# Patient Record
Sex: Male | Born: 1935 | Race: White | Hispanic: No | Marital: Married | Smoking: Never smoker
Health system: Southern US, Community
[De-identification: ages and names within clinical notes are randomized; demographics above are authoritative.]

## PROBLEM LIST (undated history)

## (undated) DIAGNOSIS — H579 Unspecified disorder of eye and adnexa: Secondary | ICD-10-CM

## (undated) DIAGNOSIS — M545 Low back pain, unspecified: Secondary | ICD-10-CM

## (undated) DIAGNOSIS — G459 Transient cerebral ischemic attack, unspecified: Secondary | ICD-10-CM

## (undated) DIAGNOSIS — M1990 Unspecified osteoarthritis, unspecified site: Secondary | ICD-10-CM

## (undated) DIAGNOSIS — I10 Essential (primary) hypertension: Secondary | ICD-10-CM

## (undated) DIAGNOSIS — I4891 Unspecified atrial fibrillation: Secondary | ICD-10-CM

## (undated) DIAGNOSIS — E785 Hyperlipidemia, unspecified: Secondary | ICD-10-CM

## (undated) DIAGNOSIS — C801 Malignant (primary) neoplasm, unspecified: Secondary | ICD-10-CM

## (undated) HISTORY — PX: TONSILLECTOMY: SUR1361

## (undated) HISTORY — DX: Low back pain: M54.5

## (undated) HISTORY — DX: Malignant (primary) neoplasm, unspecified: C80.1

## (undated) HISTORY — DX: Essential (primary) hypertension: I10

## (undated) HISTORY — PX: URETERECTOMY: SUR1402

## (undated) HISTORY — DX: Transient cerebral ischemic attack, unspecified: G45.9

## (undated) HISTORY — DX: Unspecified disorder of eye and adnexa: H57.9

## (undated) HISTORY — DX: Unspecified atrial fibrillation: I48.91

## (undated) HISTORY — DX: Unspecified osteoarthritis, unspecified site: M19.90

## (undated) HISTORY — PX: NEPHRECTOMY: SHX65

## (undated) HISTORY — PX: APPENDECTOMY: SHX54

## (undated) HISTORY — DX: Hyperlipidemia, unspecified: E78.5

## (undated) HISTORY — DX: Low back pain, unspecified: M54.50

## (undated) HISTORY — PX: HERNIA REPAIR: SHX51

---

## 1998-01-18 ENCOUNTER — Other Ambulatory Visit: Admission: RE | Admit: 1998-01-18 | Discharge: 1998-01-18 | Payer: Self-pay | Admitting: *Deleted

## 2003-12-05 ENCOUNTER — Ambulatory Visit (HOSPITAL_COMMUNITY): Admission: RE | Admit: 2003-12-05 | Discharge: 2003-12-05 | Payer: Self-pay | Admitting: Cardiology

## 2004-06-12 ENCOUNTER — Ambulatory Visit: Payer: Self-pay | Admitting: Family Medicine

## 2004-07-03 ENCOUNTER — Ambulatory Visit: Payer: Self-pay | Admitting: Family Medicine

## 2004-07-17 ENCOUNTER — Ambulatory Visit: Payer: Self-pay | Admitting: Cardiology

## 2004-07-31 ENCOUNTER — Ambulatory Visit: Payer: Self-pay | Admitting: Family Medicine

## 2004-08-28 ENCOUNTER — Ambulatory Visit: Payer: Self-pay | Admitting: Family Medicine

## 2004-09-25 ENCOUNTER — Ambulatory Visit: Payer: Self-pay | Admitting: Family Medicine

## 2004-10-10 ENCOUNTER — Ambulatory Visit: Payer: Self-pay | Admitting: Cardiology

## 2004-10-24 ENCOUNTER — Ambulatory Visit: Payer: Self-pay | Admitting: Family Medicine

## 2004-11-26 ENCOUNTER — Ambulatory Visit: Payer: Self-pay | Admitting: Family Medicine

## 2004-12-07 ENCOUNTER — Ambulatory Visit: Payer: Self-pay | Admitting: Internal Medicine

## 2004-12-25 ENCOUNTER — Ambulatory Visit: Payer: Self-pay | Admitting: Family Medicine

## 2005-01-25 ENCOUNTER — Ambulatory Visit: Payer: Self-pay | Admitting: Family Medicine

## 2005-02-08 ENCOUNTER — Ambulatory Visit: Payer: Self-pay | Admitting: Family Medicine

## 2005-03-08 ENCOUNTER — Ambulatory Visit: Payer: Self-pay | Admitting: Family Medicine

## 2005-04-08 ENCOUNTER — Ambulatory Visit: Payer: Self-pay | Admitting: Family Medicine

## 2005-05-08 ENCOUNTER — Ambulatory Visit: Payer: Self-pay | Admitting: Family Medicine

## 2005-05-09 ENCOUNTER — Ambulatory Visit: Payer: Self-pay | Admitting: Family Medicine

## 2005-06-07 ENCOUNTER — Ambulatory Visit: Payer: Self-pay | Admitting: Family Medicine

## 2005-06-10 ENCOUNTER — Ambulatory Visit: Payer: Self-pay | Admitting: Family Medicine

## 2005-06-24 ENCOUNTER — Ambulatory Visit: Payer: Self-pay | Admitting: Family Medicine

## 2005-07-16 ENCOUNTER — Ambulatory Visit: Payer: Self-pay | Admitting: Family Medicine

## 2005-07-17 ENCOUNTER — Ambulatory Visit: Payer: Self-pay | Admitting: Internal Medicine

## 2005-07-22 ENCOUNTER — Ambulatory Visit: Payer: Self-pay | Admitting: Internal Medicine

## 2005-07-30 ENCOUNTER — Ambulatory Visit: Payer: Self-pay | Admitting: Family Medicine

## 2005-08-13 ENCOUNTER — Ambulatory Visit: Payer: Self-pay | Admitting: Family Medicine

## 2005-08-27 ENCOUNTER — Ambulatory Visit: Payer: Self-pay | Admitting: Family Medicine

## 2005-09-09 ENCOUNTER — Ambulatory Visit: Payer: Self-pay | Admitting: Internal Medicine

## 2005-09-10 ENCOUNTER — Ambulatory Visit: Payer: Self-pay | Admitting: Internal Medicine

## 2005-09-24 ENCOUNTER — Ambulatory Visit: Payer: Self-pay | Admitting: Family Medicine

## 2005-10-08 ENCOUNTER — Ambulatory Visit: Payer: Self-pay | Admitting: Family Medicine

## 2005-11-05 ENCOUNTER — Ambulatory Visit: Payer: Self-pay | Admitting: Family Medicine

## 2005-12-05 ENCOUNTER — Ambulatory Visit: Payer: Self-pay | Admitting: Family Medicine

## 2006-01-01 ENCOUNTER — Ambulatory Visit: Payer: Self-pay | Admitting: Family Medicine

## 2006-01-28 ENCOUNTER — Ambulatory Visit: Payer: Self-pay | Admitting: Family Medicine

## 2006-03-05 ENCOUNTER — Ambulatory Visit: Payer: Self-pay | Admitting: Family Medicine

## 2006-03-17 ENCOUNTER — Ambulatory Visit: Payer: Self-pay | Admitting: Internal Medicine

## 2006-03-19 ENCOUNTER — Ambulatory Visit: Payer: Self-pay | Admitting: Internal Medicine

## 2006-04-08 ENCOUNTER — Ambulatory Visit: Payer: Self-pay | Admitting: Family Medicine

## 2006-05-09 ENCOUNTER — Ambulatory Visit: Payer: Self-pay | Admitting: Family Medicine

## 2006-05-28 ENCOUNTER — Ambulatory Visit: Payer: Self-pay | Admitting: Family Medicine

## 2006-06-09 ENCOUNTER — Ambulatory Visit: Payer: Self-pay | Admitting: Family Medicine

## 2006-07-09 ENCOUNTER — Ambulatory Visit: Payer: Self-pay | Admitting: Family Medicine

## 2006-07-09 LAB — CONVERTED CEMR LAB
AST: 25 units/L (ref 0–37)
Calcium: 9.2 mg/dL (ref 8.4–10.5)
Chloride: 105 meq/L (ref 96–112)
Creatinine, Ser: 1.1 mg/dL (ref 0.4–1.5)
Glomerular Filtration Rate, Af Am: 84 mL/min/{1.73_m2}
Glucose, Bld: 89 mg/dL (ref 70–99)
Hemoglobin: 17.3 g/dL — ABNORMAL HIGH (ref 13.0–17.0)
MCHC: 33.9 g/dL (ref 30.0–36.0)
MCV: 92.2 fL (ref 78.0–100.0)
Potassium: 4.1 meq/L (ref 3.5–5.1)
RBC: 5.52 M/uL (ref 4.22–5.81)
RDW: 12.8 % (ref 11.5–14.6)
Sodium: 140 meq/L (ref 135–145)
TSH: 1.87 microintl units/mL (ref 0.35–5.50)

## 2006-07-24 ENCOUNTER — Ambulatory Visit: Payer: Self-pay | Admitting: Internal Medicine

## 2006-07-24 LAB — CONVERTED CEMR LAB
AST: 28 units/L (ref 0–37)
Cholesterol: 168 mg/dL (ref 0–200)
HDL: 40 mg/dL (ref >39.0)
LDL Cholesterol: 92 mg/dL (ref 0–99)

## 2006-07-25 ENCOUNTER — Ambulatory Visit: Payer: Self-pay | Admitting: Family Medicine

## 2006-08-11 ENCOUNTER — Ambulatory Visit: Payer: Self-pay | Admitting: Family Medicine

## 2006-08-26 ENCOUNTER — Ambulatory Visit: Payer: Self-pay | Admitting: Family Medicine

## 2006-08-27 ENCOUNTER — Ambulatory Visit: Payer: Self-pay | Admitting: Family Medicine

## 2006-09-23 ENCOUNTER — Ambulatory Visit: Payer: Self-pay | Admitting: Family Medicine

## 2006-09-24 ENCOUNTER — Encounter: Admission: RE | Admit: 2006-09-24 | Discharge: 2006-09-24 | Payer: Self-pay | Admitting: Family Medicine

## 2006-09-24 ENCOUNTER — Ambulatory Visit: Payer: Self-pay | Admitting: Family Medicine

## 2006-10-21 ENCOUNTER — Ambulatory Visit: Payer: Self-pay | Admitting: Family Medicine

## 2006-11-04 ENCOUNTER — Ambulatory Visit: Payer: Self-pay | Admitting: Family Medicine

## 2006-11-12 ENCOUNTER — Encounter: Admission: RE | Admit: 2006-11-12 | Discharge: 2006-11-12 | Payer: Self-pay | Admitting: Family Medicine

## 2006-11-19 ENCOUNTER — Ambulatory Visit: Payer: Self-pay | Admitting: Family Medicine

## 2006-11-27 ENCOUNTER — Ambulatory Visit: Payer: Self-pay | Admitting: Internal Medicine

## 2006-11-27 LAB — CONVERTED CEMR LAB
Cholesterol: 211 mg/dL (ref 0–200)
HDL: 38.2 mg/dL — ABNORMAL LOW (ref >39.0)
Total CHOL/HDL Ratio: 5.5
Triglycerides: 365 mg/dL (ref 0–149)
VLDL: 73 mg/dL — ABNORMAL HIGH (ref 0–40)

## 2006-12-17 ENCOUNTER — Ambulatory Visit: Payer: Self-pay | Admitting: Family Medicine

## 2006-12-17 ENCOUNTER — Ambulatory Visit: Payer: Self-pay | Admitting: Internal Medicine

## 2007-01-07 ENCOUNTER — Ambulatory Visit: Payer: Self-pay | Admitting: Family Medicine

## 2007-01-15 ENCOUNTER — Ambulatory Visit: Payer: Self-pay | Admitting: Family Medicine

## 2007-01-26 ENCOUNTER — Ambulatory Visit: Payer: Self-pay | Admitting: Family Medicine

## 2007-02-09 ENCOUNTER — Ambulatory Visit: Payer: Self-pay | Admitting: Internal Medicine

## 2007-02-09 ENCOUNTER — Ambulatory Visit: Payer: Self-pay | Admitting: Family Medicine

## 2007-03-13 ENCOUNTER — Ambulatory Visit: Payer: Self-pay | Admitting: Family Medicine

## 2007-03-13 ENCOUNTER — Encounter: Payer: Self-pay | Admitting: Internal Medicine

## 2007-03-13 ENCOUNTER — Ambulatory Visit: Payer: Self-pay | Admitting: Internal Medicine

## 2007-03-13 DIAGNOSIS — I4891 Unspecified atrial fibrillation: Secondary | ICD-10-CM

## 2007-03-13 LAB — HM COLONOSCOPY

## 2007-03-13 LAB — CONVERTED CEMR LAB: INR: 1.1

## 2007-04-15 ENCOUNTER — Ambulatory Visit: Payer: Self-pay | Admitting: Family Medicine

## 2007-04-15 LAB — CONVERTED CEMR LAB: Prothrombin Time: 19.7 s

## 2007-05-13 ENCOUNTER — Ambulatory Visit: Payer: Self-pay | Admitting: Family Medicine

## 2007-05-13 LAB — CONVERTED CEMR LAB
INR: 2.7
Prothrombin Time: 19.9 s

## 2007-06-11 ENCOUNTER — Ambulatory Visit: Payer: Self-pay | Admitting: Family Medicine

## 2007-06-11 DIAGNOSIS — E785 Hyperlipidemia, unspecified: Secondary | ICD-10-CM | POA: Insufficient documentation

## 2007-06-11 DIAGNOSIS — I10 Essential (primary) hypertension: Secondary | ICD-10-CM

## 2007-06-11 DIAGNOSIS — M1990 Unspecified osteoarthritis, unspecified site: Secondary | ICD-10-CM | POA: Insufficient documentation

## 2007-06-11 DIAGNOSIS — Z9189 Other specified personal risk factors, not elsewhere classified: Secondary | ICD-10-CM | POA: Insufficient documentation

## 2007-06-16 ENCOUNTER — Ambulatory Visit: Payer: Self-pay | Admitting: Family Medicine

## 2007-06-16 LAB — CONVERTED CEMR LAB
INR: 2
Prothrombin Time: 17.5 s

## 2007-07-14 ENCOUNTER — Ambulatory Visit: Payer: Self-pay | Admitting: Family Medicine

## 2007-07-14 LAB — CONVERTED CEMR LAB: INR: 3.1

## 2007-08-14 ENCOUNTER — Ambulatory Visit: Payer: Self-pay | Admitting: Family Medicine

## 2007-08-14 LAB — CONVERTED CEMR LAB: Prothrombin Time: 20.3 s

## 2007-09-10 ENCOUNTER — Ambulatory Visit: Payer: Self-pay | Admitting: Internal Medicine

## 2007-09-10 LAB — CONVERTED CEMR LAB
ALT: 23 units/L (ref 0–53)
AST: 26 units/L (ref 0–37)
Direct LDL: 113.7 mg/dL
Total CHOL/HDL Ratio: 5.4
Triglycerides: 289 mg/dL (ref 0–149)
VLDL: 58 mg/dL — ABNORMAL HIGH (ref 0–40)

## 2007-09-15 ENCOUNTER — Ambulatory Visit: Payer: Self-pay | Admitting: Family Medicine

## 2007-10-02 ENCOUNTER — Ambulatory Visit: Payer: Self-pay | Admitting: Internal Medicine

## 2007-10-02 LAB — CONVERTED CEMR LAB
BUN: 15 mg/dL (ref 6–23)
CO2: 30 meq/L (ref 19–32)
Creatinine, Ser: 1.2 mg/dL (ref 0.4–1.5)
GFR calc Af Amer: 76 mL/min
Glucose, Bld: 97 mg/dL (ref 70–99)
Potassium: 4.3 meq/L (ref 3.5–5.1)

## 2007-10-13 ENCOUNTER — Ambulatory Visit: Payer: Self-pay | Admitting: Family Medicine

## 2007-10-13 LAB — CONVERTED CEMR LAB: Prothrombin Time: 24.6 s

## 2007-10-27 ENCOUNTER — Ambulatory Visit: Payer: Self-pay | Admitting: Family Medicine

## 2007-10-27 LAB — CONVERTED CEMR LAB: Prothrombin Time: 24.2 s

## 2007-11-02 ENCOUNTER — Ambulatory Visit: Payer: Self-pay | Admitting: Family Medicine

## 2007-11-10 ENCOUNTER — Ambulatory Visit: Payer: Self-pay | Admitting: Family Medicine

## 2007-11-10 LAB — CONVERTED CEMR LAB
INR: 2.4
Prothrombin Time: 19 s

## 2007-11-26 ENCOUNTER — Ambulatory Visit: Payer: Self-pay | Admitting: Family Medicine

## 2007-11-26 LAB — CONVERTED CEMR LAB: Prothrombin Time: 19.3 s

## 2007-12-23 ENCOUNTER — Ambulatory Visit: Payer: Self-pay | Admitting: Family Medicine

## 2008-01-19 ENCOUNTER — Telehealth (INDEPENDENT_AMBULATORY_CARE_PROVIDER_SITE_OTHER): Payer: Self-pay | Admitting: *Deleted

## 2008-01-20 ENCOUNTER — Ambulatory Visit: Payer: Self-pay | Admitting: Family Medicine

## 2008-01-21 ENCOUNTER — Encounter: Payer: Self-pay | Admitting: Family Medicine

## 2008-02-16 ENCOUNTER — Ambulatory Visit: Payer: Self-pay | Admitting: Family Medicine

## 2008-03-15 ENCOUNTER — Ambulatory Visit: Payer: Self-pay | Admitting: Family Medicine

## 2008-03-15 LAB — CONVERTED CEMR LAB: Prothrombin Time: 16.1 s

## 2008-04-12 ENCOUNTER — Ambulatory Visit: Payer: Self-pay | Admitting: Family Medicine

## 2008-04-12 LAB — CONVERTED CEMR LAB: Prothrombin Time: 19.2 s

## 2008-04-19 ENCOUNTER — Ambulatory Visit: Payer: Self-pay | Admitting: Family Medicine

## 2008-04-19 DIAGNOSIS — M545 Low back pain, unspecified: Secondary | ICD-10-CM | POA: Insufficient documentation

## 2008-05-06 ENCOUNTER — Encounter: Payer: Self-pay | Admitting: Family Medicine

## 2008-05-10 ENCOUNTER — Ambulatory Visit: Payer: Self-pay | Admitting: Family Medicine

## 2008-05-10 LAB — CONVERTED CEMR LAB
INR: 2
Prothrombin Time: 17.4 s

## 2008-06-07 ENCOUNTER — Ambulatory Visit: Payer: Self-pay | Admitting: Family Medicine

## 2008-06-07 LAB — CONVERTED CEMR LAB
INR: 1.7
Prothrombin Time: 15.9 s

## 2008-07-05 ENCOUNTER — Ambulatory Visit: Payer: Self-pay | Admitting: Family Medicine

## 2008-07-05 LAB — CONVERTED CEMR LAB
INR: 2.3
Prothrombin Time: 18.5 s

## 2008-07-21 ENCOUNTER — Ambulatory Visit: Payer: Self-pay | Admitting: Family Medicine

## 2008-08-02 ENCOUNTER — Ambulatory Visit: Payer: Self-pay | Admitting: Family Medicine

## 2008-08-02 LAB — CONVERTED CEMR LAB
INR: 1.9
Prothrombin Time: 16.9 s

## 2008-08-11 ENCOUNTER — Ambulatory Visit: Payer: Self-pay | Admitting: Internal Medicine

## 2008-08-16 ENCOUNTER — Ambulatory Visit: Payer: Self-pay | Admitting: Family Medicine

## 2008-09-20 ENCOUNTER — Ambulatory Visit: Payer: Self-pay | Admitting: Family Medicine

## 2008-10-11 ENCOUNTER — Ambulatory Visit: Payer: Self-pay | Admitting: Family Medicine

## 2008-10-18 ENCOUNTER — Ambulatory Visit: Payer: Self-pay | Admitting: Family Medicine

## 2008-10-21 ENCOUNTER — Ambulatory Visit: Payer: Self-pay | Admitting: Internal Medicine

## 2008-10-21 LAB — CONVERTED CEMR LAB
ALT: 25 units/L (ref 0–53)
AST: 39 units/L — ABNORMAL HIGH (ref 0–37)

## 2008-11-03 ENCOUNTER — Ambulatory Visit: Payer: Self-pay | Admitting: Family Medicine

## 2008-11-03 DIAGNOSIS — J309 Allergic rhinitis, unspecified: Secondary | ICD-10-CM | POA: Insufficient documentation

## 2008-11-15 ENCOUNTER — Ambulatory Visit: Payer: Self-pay | Admitting: Family Medicine

## 2008-11-15 LAB — CONVERTED CEMR LAB: INR: 2.6

## 2008-12-13 ENCOUNTER — Ambulatory Visit: Payer: Self-pay | Admitting: Family Medicine

## 2008-12-13 LAB — CONVERTED CEMR LAB: Prothrombin Time: 22 s

## 2008-12-26 ENCOUNTER — Ambulatory Visit: Payer: Self-pay | Admitting: Family Medicine

## 2009-01-10 ENCOUNTER — Ambulatory Visit: Payer: Self-pay | Admitting: Family Medicine

## 2009-02-14 ENCOUNTER — Ambulatory Visit: Payer: Self-pay | Admitting: Family Medicine

## 2009-03-02 ENCOUNTER — Ambulatory Visit: Payer: Self-pay | Admitting: Family Medicine

## 2009-03-02 DIAGNOSIS — Z8559 Personal history of malignant neoplasm of other urinary tract organ: Secondary | ICD-10-CM

## 2009-03-02 LAB — CONVERTED CEMR LAB
Bilirubin Urine: NEGATIVE
Glucose, Urine, Semiquant: NEGATIVE
INR: 3.5
Ketones, urine, test strip: NEGATIVE
Prothrombin Time: 22.5 s
Urobilinogen, UA: 0.2
pH: 5.5

## 2009-03-03 ENCOUNTER — Encounter: Payer: Self-pay | Admitting: Family Medicine

## 2009-03-09 ENCOUNTER — Encounter: Payer: Self-pay | Admitting: Family Medicine

## 2009-03-14 ENCOUNTER — Ambulatory Visit (HOSPITAL_BASED_OUTPATIENT_CLINIC_OR_DEPARTMENT_OTHER): Admission: RE | Admit: 2009-03-14 | Discharge: 2009-03-14 | Payer: Self-pay | Admitting: Urology

## 2009-03-21 ENCOUNTER — Encounter: Payer: Self-pay | Admitting: Family Medicine

## 2009-03-21 ENCOUNTER — Encounter: Payer: Self-pay | Admitting: Internal Medicine

## 2009-03-24 ENCOUNTER — Ambulatory Visit: Payer: Self-pay | Admitting: Family Medicine

## 2009-04-03 ENCOUNTER — Encounter: Payer: Self-pay | Admitting: Internal Medicine

## 2009-04-04 ENCOUNTER — Ambulatory Visit: Payer: Self-pay | Admitting: Family Medicine

## 2009-04-04 LAB — CONVERTED CEMR LAB: INR: 2.5

## 2009-04-11 ENCOUNTER — Encounter: Payer: Self-pay | Admitting: Internal Medicine

## 2009-04-11 ENCOUNTER — Ambulatory Visit (HOSPITAL_COMMUNITY): Admission: RE | Admit: 2009-04-11 | Discharge: 2009-04-11 | Payer: Self-pay | Admitting: Urology

## 2009-04-11 ENCOUNTER — Encounter: Payer: Self-pay | Admitting: Family Medicine

## 2009-04-20 ENCOUNTER — Encounter (INDEPENDENT_AMBULATORY_CARE_PROVIDER_SITE_OTHER): Payer: Self-pay | Admitting: Urology

## 2009-04-20 ENCOUNTER — Inpatient Hospital Stay (HOSPITAL_COMMUNITY): Admission: RE | Admit: 2009-04-20 | Discharge: 2009-04-25 | Payer: Self-pay | Admitting: Urology

## 2009-04-25 ENCOUNTER — Encounter: Payer: Self-pay | Admitting: Family Medicine

## 2009-04-28 ENCOUNTER — Ambulatory Visit: Payer: Self-pay | Admitting: Family Medicine

## 2009-04-28 LAB — CONVERTED CEMR LAB: INR: 1.3

## 2009-05-02 ENCOUNTER — Encounter: Payer: Self-pay | Admitting: Family Medicine

## 2009-05-16 ENCOUNTER — Ambulatory Visit: Payer: Self-pay | Admitting: Family Medicine

## 2009-06-13 ENCOUNTER — Ambulatory Visit: Payer: Self-pay | Admitting: Family Medicine

## 2009-06-13 LAB — CONVERTED CEMR LAB: Prothrombin Time: 17.9 s

## 2009-06-27 ENCOUNTER — Ambulatory Visit: Payer: Self-pay | Admitting: Family Medicine

## 2009-06-27 LAB — CONVERTED CEMR LAB
INR: 3
Prothrombin Time: 20.9 s

## 2009-07-14 ENCOUNTER — Ambulatory Visit: Payer: Self-pay | Admitting: Internal Medicine

## 2009-07-25 ENCOUNTER — Ambulatory Visit: Payer: Self-pay | Admitting: Family Medicine

## 2009-08-17 ENCOUNTER — Telehealth: Payer: Self-pay | Admitting: Family Medicine

## 2009-08-29 ENCOUNTER — Ambulatory Visit: Payer: Self-pay | Admitting: Family Medicine

## 2009-08-29 LAB — CONVERTED CEMR LAB
INR: 7.2 (ref 0.8–1.0)
Prothrombin Time: 73.7 s (ref 9.1–11.7)

## 2009-08-30 ENCOUNTER — Telehealth: Payer: Self-pay | Admitting: Family Medicine

## 2009-09-08 ENCOUNTER — Encounter: Payer: Self-pay | Admitting: Family Medicine

## 2009-09-12 ENCOUNTER — Ambulatory Visit: Payer: Self-pay | Admitting: Family Medicine

## 2009-09-12 LAB — CONVERTED CEMR LAB: Prothrombin Time: 19 s

## 2009-10-10 ENCOUNTER — Ambulatory Visit: Payer: Self-pay | Admitting: Family Medicine

## 2009-10-10 LAB — CONVERTED CEMR LAB: INR: 7

## 2009-10-16 ENCOUNTER — Ambulatory Visit: Payer: Self-pay | Admitting: Family Medicine

## 2009-10-23 ENCOUNTER — Ambulatory Visit: Payer: Self-pay | Admitting: Family Medicine

## 2009-10-23 LAB — CONVERTED CEMR LAB
Bilirubin Urine: NEGATIVE
Blood in Urine, dipstick: NEGATIVE
Glucose, Urine, Semiquant: NEGATIVE
INR: 1.1
Ketones, urine, test strip: NEGATIVE
Specific Gravity, Urine: 1.02
WBC Urine, dipstick: NEGATIVE
pH: 5.5

## 2009-10-24 DIAGNOSIS — N259 Disorder resulting from impaired renal tubular function, unspecified: Secondary | ICD-10-CM

## 2009-10-24 LAB — CONVERTED CEMR LAB
ALT: 30 units/L (ref 0–53)
Alkaline Phosphatase: 51 units/L (ref 39–117)
Basophils Relative: 0.4 % (ref 0.0–3.0)
Bilirubin, Direct: 0.1 mg/dL (ref 0.0–0.3)
CO2: 27 meq/L (ref 19–32)
Chloride: 113 meq/L — ABNORMAL HIGH (ref 96–112)
Eosinophils Absolute: 0.1 10*3/uL (ref 0.0–0.7)
Eosinophils Relative: 1.4 % (ref 0.0–5.0)
HCT: 42.9 % (ref 39.0–52.0)
Hemoglobin: 14.3 g/dL (ref 13.0–17.0)
LDL Cholesterol: 82 mg/dL (ref 0–99)
Lymphocytes Relative: 31.1 % (ref 12.0–46.0)
Monocytes Absolute: 0.7 10*3/uL (ref 0.1–1.0)
Monocytes Relative: 11.9 % (ref 3.0–12.0)
Platelets: 159 10*3/uL (ref 150.0–400.0)
Potassium: 4.4 meq/L (ref 3.5–5.1)
RDW: 12.4 % (ref 11.5–14.6)
Sodium: 142 meq/L (ref 135–145)
Total Bilirubin: 0.5 mg/dL (ref 0.3–1.2)
Total CHOL/HDL Ratio: 4
Total Protein: 6.3 g/dL (ref 6.0–8.3)
Triglycerides: 152 mg/dL — ABNORMAL HIGH (ref 0.0–149.0)
WBC: 5.9 10*3/uL (ref 4.5–10.5)

## 2009-11-01 ENCOUNTER — Encounter: Payer: Self-pay | Admitting: Family Medicine

## 2009-11-07 ENCOUNTER — Ambulatory Visit: Payer: Self-pay | Admitting: Family Medicine

## 2009-11-07 LAB — CONVERTED CEMR LAB: Prothrombin Time: 16.7 s

## 2009-11-21 ENCOUNTER — Ambulatory Visit: Payer: Self-pay | Admitting: Family Medicine

## 2009-12-05 ENCOUNTER — Ambulatory Visit: Payer: Self-pay | Admitting: Family Medicine

## 2009-12-05 LAB — CONVERTED CEMR LAB
INR: 2.5
Prothrombin Time: 19.3 s

## 2009-12-08 ENCOUNTER — Ambulatory Visit: Payer: Self-pay | Admitting: Family Medicine

## 2010-01-02 ENCOUNTER — Ambulatory Visit: Payer: Self-pay | Admitting: Family Medicine

## 2010-01-02 LAB — CONVERTED CEMR LAB: INR: 2.9

## 2010-01-11 ENCOUNTER — Encounter: Payer: Self-pay | Admitting: Family Medicine

## 2010-01-30 ENCOUNTER — Ambulatory Visit: Payer: Self-pay | Admitting: Family Medicine

## 2010-01-30 LAB — CONVERTED CEMR LAB: INR: 2.3

## 2010-03-06 ENCOUNTER — Ambulatory Visit: Payer: Self-pay | Admitting: Family Medicine

## 2010-03-06 LAB — CONVERTED CEMR LAB: INR: 2.9

## 2010-03-16 ENCOUNTER — Ambulatory Visit: Payer: Self-pay | Admitting: Family Medicine

## 2010-03-16 LAB — CONVERTED CEMR LAB
AST: 24 units/L (ref 0–37)
Alkaline Phosphatase: 47 units/L (ref 39–117)
BUN: 25 mg/dL — ABNORMAL HIGH (ref 6–23)
Basophils Absolute: 0 10*3/uL (ref 0.0–0.1)
Calcium: 8.6 mg/dL (ref 8.4–10.5)
Creatinine, Ser: 2.1 mg/dL — ABNORMAL HIGH (ref 0.4–1.5)
Eosinophils Absolute: 0 10*3/uL (ref 0.0–0.7)
GFR calc non Af Amer: 31.81 mL/min (ref >60)
Glucose, Bld: 97 mg/dL (ref 70–99)
Ketones, urine, test strip: NEGATIVE
Lipase: 52 units/L (ref 11.0–59.0)
Lymphocytes Relative: 14.7 % (ref 12.0–46.0)
MCHC: 34.6 g/dL (ref 30.0–36.0)
Monocytes Relative: 12.4 % — ABNORMAL HIGH (ref 3.0–12.0)
Nitrite: NEGATIVE
Platelets: 173 10*3/uL (ref 150.0–400.0)
RDW: 13.5 % (ref 11.5–14.6)
Specific Gravity, Urine: 1.025
TSH: 1.68 microintl units/mL (ref 0.35–5.50)
Total Bilirubin: 0.8 mg/dL (ref 0.3–1.2)
WBC Urine, dipstick: NEGATIVE

## 2010-03-17 ENCOUNTER — Encounter: Payer: Self-pay | Admitting: Family Medicine

## 2010-03-26 ENCOUNTER — Encounter (INDEPENDENT_AMBULATORY_CARE_PROVIDER_SITE_OTHER): Payer: Self-pay | Admitting: *Deleted

## 2010-03-28 ENCOUNTER — Ambulatory Visit: Payer: Self-pay | Admitting: Family Medicine

## 2010-04-03 ENCOUNTER — Ambulatory Visit: Payer: Self-pay | Admitting: Family Medicine

## 2010-04-03 LAB — CONVERTED CEMR LAB: INR: 3.1

## 2010-05-01 ENCOUNTER — Ambulatory Visit: Payer: Self-pay | Admitting: Family Medicine

## 2010-05-01 LAB — CONVERTED CEMR LAB: INR: 3.1

## 2010-05-17 ENCOUNTER — Ambulatory Visit: Payer: Self-pay | Admitting: Internal Medicine

## 2010-05-17 ENCOUNTER — Encounter (INDEPENDENT_AMBULATORY_CARE_PROVIDER_SITE_OTHER): Payer: Self-pay | Admitting: *Deleted

## 2010-05-17 DIAGNOSIS — Z860100 Personal history of colon polyps, unspecified: Secondary | ICD-10-CM | POA: Insufficient documentation

## 2010-05-29 ENCOUNTER — Ambulatory Visit: Payer: Self-pay | Admitting: Family Medicine

## 2010-05-29 DIAGNOSIS — J209 Acute bronchitis, unspecified: Secondary | ICD-10-CM

## 2010-06-05 ENCOUNTER — Ambulatory Visit: Payer: Self-pay | Admitting: Family Medicine

## 2010-06-05 LAB — CONVERTED CEMR LAB: INR: 2.7

## 2010-06-27 ENCOUNTER — Telehealth: Payer: Self-pay | Admitting: Internal Medicine

## 2010-06-28 ENCOUNTER — Ambulatory Visit: Payer: Self-pay | Admitting: Internal Medicine

## 2010-06-28 HISTORY — PX: COLONOSCOPY: SHX174

## 2010-07-02 ENCOUNTER — Encounter: Payer: Self-pay | Admitting: Internal Medicine

## 2010-07-10 ENCOUNTER — Ambulatory Visit: Payer: Self-pay | Admitting: Family Medicine

## 2010-07-10 LAB — CONVERTED CEMR LAB: INR: 2.4

## 2010-07-11 ENCOUNTER — Ambulatory Visit: Payer: Self-pay | Admitting: Family Medicine

## 2010-07-12 LAB — CONVERTED CEMR LAB
ALT: 21 units/L (ref 0–53)
AST: 25 units/L (ref 0–37)
Alkaline Phosphatase: 47 units/L (ref 39–117)
HDL: 40.6 mg/dL (ref >39.00)
Total Bilirubin: 0.6 mg/dL (ref 0.3–1.2)
Total CHOL/HDL Ratio: 4
Triglycerides: 161 mg/dL — ABNORMAL HIGH (ref 0.0–149.0)

## 2010-07-13 ENCOUNTER — Encounter: Payer: Self-pay | Admitting: Family Medicine

## 2010-07-19 ENCOUNTER — Ambulatory Visit: Payer: Self-pay | Admitting: Internal Medicine

## 2010-07-19 ENCOUNTER — Encounter: Payer: Self-pay | Admitting: Internal Medicine

## 2010-07-27 ENCOUNTER — Ambulatory Visit: Payer: Self-pay | Admitting: Family Medicine

## 2010-07-28 ENCOUNTER — Inpatient Hospital Stay (HOSPITAL_COMMUNITY)
Admission: EM | Admit: 2010-07-28 | Discharge: 2010-07-29 | Payer: Self-pay | Source: Home / Self Care | Attending: Internal Medicine | Admitting: Internal Medicine

## 2010-07-28 ENCOUNTER — Emergency Department (HOSPITAL_BASED_OUTPATIENT_CLINIC_OR_DEPARTMENT_OTHER)
Admission: EM | Admit: 2010-07-28 | Discharge: 2010-07-28 | Disposition: A | Discharge status: Other Acute Inpatient Hospital | Payer: Self-pay | Source: Home / Self Care | Admitting: Emergency Medicine

## 2010-07-29 ENCOUNTER — Encounter: Payer: Self-pay | Admitting: Family Medicine

## 2010-07-31 ENCOUNTER — Ambulatory Visit: Payer: Self-pay | Admitting: Family Medicine

## 2010-07-31 DIAGNOSIS — G459 Transient cerebral ischemic attack, unspecified: Secondary | ICD-10-CM | POA: Insufficient documentation

## 2010-08-07 ENCOUNTER — Ambulatory Visit: Payer: Self-pay | Admitting: Family Medicine

## 2010-08-08 ENCOUNTER — Encounter: Payer: Self-pay | Admitting: Family Medicine

## 2010-08-14 ENCOUNTER — Ambulatory Visit
Admission: RE | Admit: 2010-08-14 | Discharge: 2010-08-14 | Payer: Self-pay | Source: Home / Self Care | Attending: Family Medicine | Admitting: Family Medicine

## 2010-08-14 LAB — CONVERTED CEMR LAB: INR: 1.7

## 2010-08-21 ENCOUNTER — Ambulatory Visit
Admission: RE | Admit: 2010-08-21 | Discharge: 2010-08-21 | Payer: Self-pay | Source: Home / Self Care | Attending: Family Medicine | Admitting: Family Medicine

## 2010-09-09 LAB — CONVERTED CEMR LAB
AST: 25 units/L (ref 0–37)
Albumin: 3.9 g/dL (ref 3.5–5.2)
Alkaline Phosphatase: 69 units/L (ref 39–117)
Bilirubin, Direct: 0.1 mg/dL (ref 0.0–0.3)
Cholesterol: 171 mg/dL (ref 0–200)
Eosinophils Absolute: 0 10*3/uL (ref 0.0–0.7)
GFR calc Af Amer: 83 mL/min
GFR calc non Af Amer: 69 mL/min
HCT: 49.6 % (ref 39.0–52.0)
HDL: 36.7 mg/dL — ABNORMAL LOW (ref >39.0)
MCV: 92.7 fL (ref 78.0–100.0)
Monocytes Absolute: 0.6 10*3/uL (ref 0.1–1.0)
Neutrophils Relative %: 52.6 % (ref 43.0–77.0)
Platelets: 136 10*3/uL — ABNORMAL LOW (ref 150–400)
Potassium: 4.6 meq/L (ref 3.5–5.1)
Prothrombin Time: 20.7 s
RDW: 13.1 % (ref 11.5–14.6)
Sodium: 141 meq/L (ref 135–145)
Total Bilirubin: 1.1 mg/dL (ref 0.3–1.2)
Total CHOL/HDL Ratio: 4.7
Triglycerides: 230 mg/dL (ref 0–149)
Triglycerides: 296 mg/dL — ABNORMAL HIGH (ref 0.0–149.0)

## 2010-09-13 NOTE — Assessment & Plan Note (Signed)
Summary: pt/njr   Nurse Visit   Review of Systems       Flu Vaccine Consent Questions     Do you have a history of severe allergic reactions to this vaccine? no    Any prior history of allergic reactions to egg and/or gelatin? no    Do you have a sensitivity to the preservative Thimersol? no    Do you have a past history of Guillan-Barre Syndrome? no    Do you currently have an acute febrile illness? no    Have you ever had a severe reaction to latex? no    Vaccine information given and explained to patient? yes    Are you currently pregnant? no    Lot Number:[id number]   Exp Date:02/09/2011   Site Given  Left Deltoid IM Josph Macho RMA  May 01, 2010 2:12 PM    Allergies: No Known Drug Allergies Laboratory Results   Blood Tests      INR: 3.1   (Normal Range: 0.88-1.12   Therap INR: 2.0-3.5) Comments: [name]  May 01, 2010 2:01 PM     Orders Added: 1)  Est. Patient Level I [99211] 2)  Protime [16109UE] 3)  Flu Vaccine 68yrs + MEDICARE PATIENTS [Q2039] 4)  Administration Flu vaccine - MCR [G0008]   ANTICOAGULATION RECORD PREVIOUS REGIMEN & LAB RESULTS Anticoagulation Diagnosis:  Atrial fibrillation,v58.61,v58.83 on  03/13/2007 Previous INR Goal Range:  2.5-3.5 on  03/13/2007 Previous INR:  3.1 on  04/03/2010 Previous Coumadin Dose(mg):  5 mg M-F, 2.5 mg Sat & Sun on  11/07/2009 Previous Regimen:  same on  04/03/2010  NEW REGIMEN & LAB RESULTS Current INR: 3.1 Regimen: same  Repeat testing in: 4 weeks  Anticoagulation Visit Questionnaire Coumadin dose missed/changed:  No Abnormal Bleeding Symptoms:  No  Any diet changes including alcohol intake, vegetables or greens since the last visit:  Yes      Diet Comments:Maybe a few more greens. Any illnesses or hospitalizations since the last visit:  No Any signs of clotting since the last visit (including chest discomfort, dizziness, shortness of breath, arm tingling, slurred speech,  swelling or redness in leg):  No  MEDICATIONS COUMADIN 5 MG TABS (WARFARIN SODIUM) as directed TOPROL XL 25 MG TB24 (METOPROLOL SUCCINATE) take 1/2 once daily VYTORIN 10-80 MG TABS (EZETIMIBE-SIMVASTATIN) 1/2 tablet once daily MULTIVITAMINS   TABS (MULTIPLE VITAMIN) 1 by mouth once daily FISH OIL 1000 MG CAPS (OMEGA-3 FATTY ACIDS) take 2 capsules daily FENOFIBRATE 160 MG TABS (FENOFIBRATE) 1 by mouth once daily

## 2010-09-13 NOTE — Letter (Signed)
Summary: Patient should not travel due to medical condition  Patient should not travel due to medical condition   Imported By: Maryln Gottron 08/14/2010 10:26:19  _____________________________________________________________________  External Attachment:    Type:   Image     Comment:   External Document

## 2010-09-13 NOTE — Assessment & Plan Note (Signed)
Summary: f/u--ch.--DISCUSS RECALL COLON/SP    History of Present Illness Visit Type: Initial Visit Primary GI MD: Stan Head MD [facility] Primary Provider: Gershon Crane, MD Chief Complaint: Colon screening discuss History of Present Illness:   Last seen 03/2007 Tubulovillous adenoma removed from colon. Due for repeat screening/surveillance. is on warfarin for A FIB.    GI Review of Systems      Denies abdominal pain, acid reflux, belching, bloating, chest pain, dysphagia with liquids, dysphagia with solids, heartburn, loss of appetite, nausea, vomiting, vomiting blood, weight loss, and  weight gain.        Denies anal fissure, black tarry stools, change in bowel habit, constipation, diarrhea, diverticulosis, fecal incontinence, heme positive stool, hemorrhoids, irritable bowel syndrome, jaundice, light color stool, liver problems, rectal bleeding, and  rectal pain. Clinical Reports Reviewed:  Colonoscopy:  03/13/2007:  Comments: 1) 1 CM SIGMOID POLYP REMOVED 2) DIVERTICULOSIS LEFT AND RIGHT COLON 3) INT/EXT HEMORRHOIDS 4) EXCELLENT PREP  ***MICROSCOPIC EXAMINATION AND DIAGNOSIS***    COLON, SIGMOID, POLYP(S): TUBULOVILLOUS ADENOMA. NO HIGH GRADE DYSPLASIA IS IDENTIFIED.   Preventive Screening-Counseling & Management      Drug Use:  no.      Current Medications (verified): 1)  Coumadin 5 Mg Tabs (Warfarin Sodium) .... As Directed 2)  Toprol Xl 25 Mg Tb24 (Metoprolol Succinate) .... Take 1/2 Once Daily 3)  Vytorin 10-80 Mg Tabs (Ezetimibe-Simvastatin) .... 1/2 Tablet Once Daily 4)  Fish Oil 1000 Mg Caps (Omega-3 Fatty Acids) .... Take 2 Capsules Daily 5)  Fenofibrate 160 Mg Tabs (Fenofibrate) .[name].. 1 By Mouth Once Daily  Allergies (verified): No Known Drug Allergies  Past History:  Past Medical History: Hypertension Hyperlipidemia Atrial fibrillation, sees Dr. Tenny Craw Osteoarthritis hemorrhoids Ureteral cancer Tubulovillous adenoma of colon 2008  Low back pain,  sees Dr. Jeral Fruit sees Dr. Aldean Ast once a year for prostate exams  Past Surgical History: Appendectomy Tonsillectomy Repair of bilateral inguinal herinas Nephrectomy left and ureterectomy 04/20/09 per Dr. Heloise Purpura  Social History: Retired Married Never Smoked Alcohol use-no Illicit Drug Use - no Drug Use:  no  Review of Systems       The patient complains of arthritis/joint pain, back pain, cough, fatigue, sleeping problems, urine leakage, and voice change.    Vital Signs:  Patient profile:   77 year old male Height:      71 inches Weight:      195.38 pounds BMI:     27.35 Pulse rate:   60 / minute Pulse rhythm:   irregular BP sitting:   100 / 68  (left arm) Cuff size:   regular  Vitals Entered By: June McMurray CMA Duncan Dull) (May 17, 2010 9:01 AM)  Physical Exam  General:  Well developed, well nourished, no acute distress. Mouth:  clear ASA I airway Lungs:  Clear throughout to auscultation. Heart:  Regular rate and rhythm; no murmurs, rubs,  or bruits. Psych:  Alert and cooperative. Normal mood and affect.   Impression & Recommendations:  Problem # 1:  TUBULOVILLOUS ADENOMA, COLON, HX OF (ICD-V12.72) Assessment Unchanged  1 cm tubulovillous adenoma removed 8/08 increases risk of colorectal cancer so a repeat screening colonoscopy is appropriate  Orders: Colonoscopy (Colon)  Problem # 2:  SCREENING, COLON CANCER (ICD-V76.51) Assessment: Unchanged  Orders: Colonoscopy (Colon)  Problem # 3:  ATRIAL FIBRILLATION (ICD-427.31) Assessment: Unchanged I have explained need to hold warfarin for 3-5 days and possibility of stroke while off therapy but that it makes most sense overall to hold  it for colonoscopy, if ok with cardiologist  Problem # 4:  COUMADIN THERAPY (ICD-V58.61) Assessment: Unchanged  Patient Instructions: 1)  Copy sent to : Gershon Crane. MD and Dietrich Pates, MD 2)  Please pick up your medications at your pharmacy. 3)  We will see you at  your procedure on 06/28/10. 4)  [facility] Patient Information Guide given to patient.  5)  Colonoscopy and Flexible Sigmoidoscopy brochure given.  6)  The medication list was reviewed and reconciled.  All changed / newly prescribed medications were explained.  A complete medication list was provided to the patient / caregiver. Prescriptions: MOVIPREP 100 GM  SOLR (PEG-KCL-NACL-NASULF-NA ASC-C) As per prep instructions.  #1 x 0   Entered by:   Francee Piccolo CMA (AAMA)   Authorized by:   Iva Boop MD, [facility]   Signed by:   Francee Piccolo CMA (AAMA) on 05/17/2010   Method used:   Electronically to        [facility] [phone]* (retail)       [address] [place], [place]  [address]       Ph: [phone]       Fax: [phone]   RxID:   [id number]

## 2010-09-13 NOTE — Assessment & Plan Note (Signed)
Summary: pt/cjr/pt rsc/cjr   Nurse Visit   Allergies: No Known Drug Allergies Laboratory Results   Blood Tests      INR: 2.9   (Normal Range: 0.88-1.12   Therap INR: 2.0-3.5) Comments: [name]  March 06, 2010 2:16 PM     Orders Added: 1)  Est. Patient Level I [99211] 2)  Protime [16109UE]   ANTICOAGULATION RECORD PREVIOUS REGIMEN & LAB RESULTS Anticoagulation Diagnosis:  Atrial fibrillation,v58.61,v58.83 on  03/13/2007 Previous INR Goal Range:  2.5-3.5 on  03/13/2007 Previous INR:  2.3 on  01/30/2010 Previous Coumadin Dose(mg):  5 mg M-F, 2.5 mg Sat & Sun on  11/07/2009 Previous Regimen:  same on  01/30/2010  NEW REGIMEN & LAB RESULTS Current INR: 2.9 Regimen: same  Repeat testing in: 4 weeks  Anticoagulation Visit Questionnaire Coumadin dose missed/changed:  No Abnormal Bleeding Symptoms:  No  Any diet changes including alcohol intake, vegetables or greens since the last visit:  No Any illnesses or hospitalizations since the last visit:  No Any signs of clotting since the last visit (including chest discomfort, dizziness, shortness of breath, arm tingling, slurred speech, swelling or redness in leg):  No  MEDICATIONS COUMADIN 5 MG TABS (WARFARIN SODIUM) as directed TOPROL XL 25 MG TB24 (METOPROLOL SUCCINATE) take 1/2 once daily VYTORIN 10-80 MG TABS (EZETIMIBE-SIMVASTATIN) 1/2 tablet once daily MULTIVITAMINS   TABS (MULTIPLE VITAMIN) 1 by mouth once daily FISH OIL 1000 MG CAPS (OMEGA-3 FATTY ACIDS) take 2 capsules daily FENOFIBRATE 160 MG TABS (FENOFIBRATE) 1 by mouth once daily

## 2010-09-13 NOTE — Assessment & Plan Note (Signed)
Summary: PT // RS   Nurse Visit   Allergies: No Known Drug Allergies  Orders Added: 1)  Est. Patient Level I [99211] 2)  Protime [85610QW] 3)  TLB-PT (Protime) [85610-PTP]   ANTICOAGULATION RECORD PREVIOUS REGIMEN & LAB RESULTS Anticoagulation Diagnosis:  Atrial fibrillation,v58.61,v58.83 on  03/13/2007 Previous INR Goal Range:  2.5-3.5 on  03/13/2007 Previous INR:  2.9 on  07/25/2009 Previous Coumadin Dose(mg):  5 mg qd on  02/16/2008 Previous Regimen:  same on  07/25/2009  NEW REGIMEN & LAB RESULTS Regimen: stop until we get results from lab   Anticoagulation Visit Questionnaire Coumadin dose missed/changed:  No Abnormal Bleeding Symptoms:  No  Any diet changes including alcohol intake, vegetables or greens since the last visit:  No Any illnesses or hospitalizations since the last visit:  No Any signs of clotting since the last visit (including chest discomfort, dizziness, shortness of breath, arm tingling, slurred speech, swelling or redness in leg):  No  MEDICATIONS COUMADIN 5 MG TABS (WARFARIN SODIUM) 1 by mouth once daily TOPROL XL 25 MG TB24 (METOPROLOL SUCCINATE) take 1/2 once daily VYTORIN 10-80 MG TABS (EZETIMIBE-SIMVASTATIN) 1/2 tablet once daily MULTIVITAMINS   TABS (MULTIPLE VITAMIN) 1 by mouth once daily FISH OIL 1000 MG CAPS (OMEGA-3 FATTY ACIDS) take 2 capsules daily FENOFIBRATE 160 MG TABS (FENOFIBRATE) 1 by mouth once daily

## 2010-09-13 NOTE — Assessment & Plan Note (Signed)
Summary: pt//ccm   Nurse Visit   Allergies: No Known Drug Allergies Laboratory Results   Blood Tests      INR: 2.9   (Normal Range: 0.88-1.12   Therap INR: 2.0-3.5) Comments: [name]  Jan 02, 2010 2:20 PM     Orders Added: 1)  Est. Patient Level I [99211] 2)  Protime [16109UE]    ANTICOAGULATION RECORD PREVIOUS REGIMEN & LAB RESULTS Anticoagulation Diagnosis:  Atrial fibrillation,v58.61,v58.83 on  03/13/2007 Previous INR Goal Range:  2.5-3.5 on  03/13/2007 Previous INR:  2.5 on  12/05/2009 Previous Coumadin Dose(mg):  5 mg M-F, 2.5 mg Sat & Sun on  11/07/2009 Previous Regimen:  same on  12/05/2009  NEW REGIMEN & LAB RESULTS Current INR: 2.9 Regimen: same  Repeat testing in: 4 weeks  Anticoagulation Visit Questionnaire Coumadin dose missed/changed:  No Abnormal Bleeding Symptoms:  No  Any diet changes including alcohol intake, vegetables or greens since the last visit:  No Any illnesses or hospitalizations since the last visit:  No Any signs of clotting since the last visit (including chest discomfort, dizziness, shortness of breath, arm tingling, slurred speech, swelling or redness in leg):  No  MEDICATIONS COUMADIN 5 MG TABS (WARFARIN SODIUM) as directed TOPROL XL 25 MG TB24 (METOPROLOL SUCCINATE) take 1/2 once daily VYTORIN 10-80 MG TABS (EZETIMIBE-SIMVASTATIN) 1/2 tablet once daily MULTIVITAMINS   TABS (MULTIPLE VITAMIN) 1 by mouth once daily FISH OIL 1000 MG CAPS (OMEGA-3 FATTY ACIDS) take 2 capsules daily FENOFIBRATE 160 MG TABS (FENOFIBRATE) 1 by mouth once daily

## 2010-09-13 NOTE — Assessment & Plan Note (Signed)
Summary: cough/njr---- [facility] PT // RS   Vital Signs:  Patient profile:   77 year old male Weight:      193 pounds BMI:     27.40 Temp:     98.1 degrees F oral Pulse rate:   96 / minute Pulse rhythm:   irregular BP sitting:   118 / 88  (left arm) Cuff size:   regular  Vitals Entered By: Raechel Ache, RN (March 16, 2010 9:30 AM) CC: Got sick Monday with abdominal discomfort- started on L side and moved to middle. No N, V or diarrhea. Still tender today., Hypertension Management CBG Result 123   History of Present Illness: Here for 3 days of lower abdominal pains. These started in the LLQ and have spread across the entire lower abdomen, though the left side is still the worst. he has some lower back pains also. Has had a fever to 99.7 degrees. No change in urinations. No nausea or vomitting. His appetite is diminished. he has had only a single small BM in the past 3 days. No blood was seen.   Hypertension History:      Positive major cardiovascular risk factors include male age 38 years old or older, hyperlipidemia, and hypertension.  Negative major cardiovascular risk factors include non-tobacco-user status.        Positive history for target organ damage include renal insufficiency.     Allergies (verified): No Known Drug Allergies  Past History:  Past Medical History: Reviewed history from 03/02/2009 and no changes required. Hypertension Hyperlipidemia Atrial fibrillation, sees Dr. Tenny Craw Osteoarthritis hemorrhoids Low back pain, sees Dr. Jeral Fruit sees Dr. Aldean Ast once a year for prostate exams  Past Surgical History: Reviewed history from 04/28/2009 and no changes required. Appendectomy Tonsillectomy Repair of bilateral inguinal herinas colonoscopy 03-13-07 per Dr. Leone Payor, repeat in 3 years Nephrectomy left and ureterectomy 04/20/09 per Dr. Heloise Purpura  Review of Systems  The patient denies anorexia, weight loss, weight gain, vision loss, decreased hearing,  hoarseness, chest pain, syncope, dyspnea on exertion, peripheral edema, prolonged cough, headaches, hemoptysis, melena, hematochezia, severe indigestion/heartburn, hematuria, incontinence, genital sores, muscle weakness, suspicious skin lesions, transient blindness, difficulty walking, depression, unusual weight change, abnormal bleeding, enlarged lymph nodes, angioedema, breast masses, and testicular masses.    Physical Exam  General:  alert but a bit uncomfortable Lungs:  Normal respiratory effort, chest expands symmetrically. Lungs are clear to auscultation, no crackles or wheezes. Heart:  Normal rate and regular rhythm. S1 and S2 normal without gallop, murmur, click, rub or other extra sounds. Abdomen:  soft, normal bowel sounds, no distention, no masses, no guarding, no rigidity, no abdominal hernia, no inguinal hernia, no hepatomegaly, and no splenomegaly.  He is diffusely tender but he is much more tender in the suprapubic area and in the LLQ. He has positive rebound tenderness in the LLQ. He has positive CVAT on the left mid back.  Rectal:  No external abnormalities noted. Normal sphincter tone. No rectal masses or tenderness. Heme negative Prostate:  no nodules, no asymmetry, tender, boggy, and 2+ enlarged.     Impression & Recommendations:  Problem # 1:  ACUTE PROSTATITIS (ICD-601.0)  Orders: Venipuncture (81191) TLB-BMP (Basic Metabolic Panel-BMET) (80048-METABOL) TLB-CBC Platelet - w/Differential (85025-CBCD) TLB-Hepatic/Liver Function Pnl (80076-HEPATIC) TLB-TSH (Thyroid Stimulating Hormone) (84443-TSH) TLB-Amylase (82150-AMYL) TLB-Lipase (83690-LIPASE) Specimen Handling (47829) Rocephin  250mg  (F6213) Admin of Therapeutic Inj  intramuscular or subcutaneous (08657)  Complete Medication List: 1)  Coumadin 5 Mg Tabs (Warfarin sodium) .... As directed  2)  Toprol Xl 25 Mg Tb24 (Metoprolol succinate) .... Take 1/2 once daily 3)  Vytorin 10-80 Mg Tabs (Ezetimibe-simvastatin)  .... 1/2 tablet once daily 4)  Multivitamins Tabs (Multiple vitamin) .[name].. 1 by mouth once daily 5)  Fish Oil 1000 Mg Caps (Omega-3 fatty acids) .... Take 2 capsules daily 6)  Fenofibrate 160 Mg Tabs (Fenofibrate) .[name].. 1 by mouth once daily 7)  Cipro 500 Mg Tabs (Ciprofloxacin hcl) .... Two times a day  Other Orders: Capillary Blood Glucose/CBG (86578)  Hypertension Assessment/Plan:      The patient's hypertensive risk group is category C: Target organ damage and/or diabetes.  His calculated 10 year risk of coronary heart disease is 14 %.  Today's blood pressure is 118/88.    Patient Instructions: 1)  Given a Rocephin shot today, and he will begin a course of Cipro. Drink lots of water. get further labs today including a CBC.  Prescriptions: CIPRO 500 MG TABS (CIPROFLOXACIN HCL) two times a day  #28 x 0   Entered and Authorized by:   Nelwyn Salisbury MD   Signed by:   Nelwyn Salisbury MD on 03/16/2010   Method used:   Electronically to        [facility] [phone]* (retail)       [address] [place], [place]  [address]       Ph: [phone]       Fax: [phone]   RxID:   [id number]   Laboratory Results   Urine Tests    Routine Urinalysis   Color: yellow Appearance: Hazy Glucose: 500   (Normal Range: Negative) Bilirubin: small   (Normal Range: Negative) Ketone: negative   (Normal Range: Negative) Spec. Gravity: 1.025   (Normal Range: 1.003-1.035) Blood: small   (Normal Range: Negative) pH: 6.5   (Normal Range: 5.0-8.0) Protein: >=300   (Normal Range: Negative) Urobilinogen: 0.2   (Normal Range: 0-1) Nitrite: negative   (Normal Range: Negative) Leukocyte Esterace: negative   (Normal Range: Negative)     Blood Tests     CBG Random:: 123mg /dL       Medication Administration  Injection # 1:    Medication: Rocephin  250mg     Diagnosis: ACUTE PROSTATITIS (ICD-601.0)    Route: IM    Site: RUOQ gluteus    Exp Date: 03/14    Lot #: [id number]    Mfr:  novaplus    Comments: 1 gram given    Patient tolerated injection without complications    Given by: Raechel Ache, RN (March 16, 2010 11:06 AM)  Orders Added: 1)  Capillary Blood Glucose/CBG [82948] 2)  Venipuncture [41660] 3)  TLB-BMP (Basic Metabolic Panel-BMET) [80048-METABOL] 4)  TLB-CBC Platelet - w/Differential [85025-CBCD] 5)  TLB-Hepatic/Liver Function Pnl [80076-HEPATIC] 6)  TLB-TSH (Thyroid Stimulating Hormone) [84443-TSH] 7)  TLB-Amylase [82150-AMYL] 8)  TLB-Lipase [83690-LIPASE] 9)  Specimen Handling [99000] 10)  Est. Patient Level IV [63016] 11)  Rocephin  250mg  [J0696] 12)  Admin of Therapeutic Inj  intramuscular or subcutaneous [96372]   Appended Document: cough/njr---- [facility] PT // RS     Allergies: No Known Drug Allergies   Impression & Recommendations:  Problem # 1:  ACUTE PROSTATITIS (ICD-601.0)  Orders: T-Culture, Urine (01093-23557)  Complete Medication List: 1)  Coumadin 5 Mg Tabs (Warfarin sodium) .... As directed 2)  Toprol Xl 25 Mg Tb24 (Metoprolol succinate) .... Take 1/2 once daily 3)  Vytorin 10-80 Mg Tabs (Ezetimibe-simvastatin) .Marland KitchenMarland KitchenMarland Kitchen  1/2 tablet once daily 4)  Multivitamins Tabs (Multiple vitamin) .[name].. 1 by mouth once daily 5)  Fish Oil 1000 Mg Caps (Omega-3 fatty acids) .... Take 2 capsules daily 6)  Fenofibrate 160 Mg Tabs (Fenofibrate) .[name].. 1 by mouth once daily 7)  Cipro 500 Mg Tabs (Ciprofloxacin hcl) .... Two times a day

## 2010-09-13 NOTE — Assessment & Plan Note (Signed)
Summary: pt/njr   Nurse Visit   Allergies: No Known Drug Allergies Laboratory Results   Blood Tests   Date/Time Received: October 10, 2009 2:42 PM  Date/Time Reported: October 10, 2009 2:42 PM   PT: 31.8 s   (Normal Range: 10.6-13.4)  INR: 7.0   (Normal Range: 0.88-1.12   Therap INR: 2.0-3.5) Comments: Wynona Canes, CMA  October 10, 2009 2:42 PM     Orders Added: 1)  Est. Patient Level I [99211] 2)  Protime [60454UJ]  Laboratory Results   Blood Tests     PT: 31.8 s   (Normal Range: 10.6-13.4)  INR: 7.0   (Normal Range: 0.88-1.12   Therap INR: 2.0-3.5) Comments: Wynona Canes, CMA  October 10, 2009 2:42 PM       ANTICOAGULATION RECORD PREVIOUS REGIMEN & LAB RESULTS Anticoagulation Diagnosis:  Atrial fibrillation,v58.61,v58.83 on  03/13/2007 Previous INR Goal Range:  2.5-3.5 on  03/13/2007 Previous INR:  2.4 on  09/12/2009 Previous Coumadin Dose(mg):  5 mg qd on  02/16/2008 Previous Regimen:  same on  09/12/2009  NEW REGIMEN & LAB RESULTS Current INR: 7.0 Regimen: Hold       Repeat testing in: 10-16-09 MEDICATIONS COUMADIN 5 MG TABS (WARFARIN SODIUM) 1 by mouth once daily TOPROL XL 25 MG TB24 (METOPROLOL SUCCINATE) take 1/2 once daily VYTORIN 10-80 MG TABS (EZETIMIBE-SIMVASTATIN) 1/2 tablet once daily MULTIVITAMINS   TABS (MULTIPLE VITAMIN) 1 by mouth once daily FISH OIL 1000 MG CAPS (OMEGA-3 FATTY ACIDS) take 2 capsules daily FENOFIBRATE 160 MG TABS (FENOFIBRATE) 1 by mouth once daily   Anticoagulation Visit Questionnaire      Coumadin dose missed/changed:  No      Abnormal Bleeding Symptoms:  No   Any diet changes including alcohol intake, vegetables or greens since the last visit:  Yes      Diet Comments:Eat's a lot of Greens  Any illnesses or hospitalizations since the last visit:  No Any signs of clotting since the last visit (including chest discomfort, dizziness, shortness of breath, arm tingling, slurred speech, swelling or redness in leg):   No

## 2010-09-13 NOTE — Letter (Signed)
Summary: Colonoscopy-Changed to Office Visit Letter  [facility]  [address] [place], [place] [address]   Phone: [phone]  Fax: [phone]      March 26, 2010 MRN: [id number]   Justin Gallegos [address] [place], [place]  [address]   Dear [name],   According to our records, it is time for you to schedule a Colonoscopy. However, after reviewing your medical record, I feel that an office visit would be most appropriate to more completely evaluate you and determine your need for a repeat procedure.  Please call [phone] (option #2) at your convenience to schedule an office visit. If you have any questions, concerns, or feel that this letter is in error, we would appreciate your call.   Sincerely,   Iva Boop, M.D.  [facility] [phone]

## 2010-09-13 NOTE — Assessment & Plan Note (Signed)
Summary: non-productive cough/chest congestion/head congestion/cjr   Vital Signs:  Patient profile:   77 year old male Weight:      196 pounds O2 Sat:      95 % Temp:     98.3 degrees F Pulse rate:   98 / minute BP sitting:   130 / 84  (left arm) Cuff size:   regular  Vitals Entered By: Pura Spice, RN (May 29, 2010 10:47 AM) CC: cough stuffy nose  Is Patient Diabetic? No   History of Present Illness: Here for 3 days of PND, chest congestion, and coughing up green sputum. No fever.   Allergies (verified): No Known Drug Allergies  Past History:  Past Medical History: Reviewed history from 05/17/2010 and no changes required. Hypertension Hyperlipidemia Atrial fibrillation, sees Dr. Tenny Craw Osteoarthritis hemorrhoids Ureteral cancer Tubulovillous adenoma of colon 2008  Low back pain, sees Dr. Jeral Fruit sees Dr. Aldean Ast once a year for prostate exams  Review of Systems  The patient denies anorexia, fever, weight loss, weight gain, vision loss, decreased hearing, hoarseness, chest pain, syncope, dyspnea on exertion, peripheral edema, headaches, hemoptysis, abdominal pain, melena, hematochezia, severe indigestion/heartburn, hematuria, incontinence, genital sores, muscle weakness, suspicious skin lesions, transient blindness, difficulty walking, depression, unusual weight change, abnormal bleeding, enlarged lymph nodes, angioedema, breast masses, and testicular masses.    Physical Exam  General:  Well-developed,well-nourished,in no acute distress; alert,appropriate and cooperative throughout examination Head:  Normocephalic and atraumatic without obvious abnormalities. No apparent alopecia or balding. Eyes:  No corneal or conjunctival inflammation noted. EOMI. Perrla. Funduscopic exam benign, without hemorrhages, exudates or papilledema. Vision grossly normal. Ears:  External ear exam shows no significant lesions or deformities.  Otoscopic examination reveals clear canals,  tympanic membranes are intact bilaterally without bulging, retraction, inflammation or discharge. Hearing is grossly normal bilaterally. Nose:  External nasal examination shows no deformity or inflammation. Nasal mucosa are pink and moist without lesions or exudates. Mouth:  Oral mucosa and oropharynx without lesions or exudates.  Teeth in good repair. Neck:  No deformities, masses, or tenderness noted. Lungs:  scattered rhonchi   Impression & Recommendations:  Problem # 1:  ACUTE BRONCHITIS (ICD-466.0)  His updated medication list for this problem includes:    Zithromax Z-pak 250 Mg Tabs (Azithromycin) .[name]... As directed  Complete Medication List: 1)  Coumadin 5 Mg Tabs (Warfarin sodium) .... As directed 2)  Toprol Xl 25 Mg Tb24 (Metoprolol succinate) .... Take 1/2 once daily 3)  Vytorin 10-80 Mg Tabs (Ezetimibe-simvastatin) .... 1/2 tablet once daily 4)  Fish Oil 1000 Mg Caps (Omega-3 fatty acids) .... Take 2 capsules daily 5)  Fenofibrate 160 Mg Tabs (Fenofibrate) .[name].. 1 by mouth once daily 6)  Moviprep 100 Gm Solr (Peg-kcl-nacl-nasulf-na asc-c) .... As per prep instructions. 7)  Flonase 50 Mcg/act Susp (Fluticasone propionate) .... 2 sprays in each nostril once daily 8)  Zithromax Z-pak 250 Mg Tabs (Azithromycin) .... As directed  Patient Instructions: 1)  Please schedule a follow-up appointment as needed .  Prescriptions: ZITHROMAX Z-PAK 250 MG TABS (AZITHROMYCIN) as directed  #1 x 0   Entered and Authorized by:   Nelwyn Salisbury MD   Signed by:   Nelwyn Salisbury MD on 05/29/2010   Method used:   Electronically to        [facility] [phone]* (retail)       [address] [place], [place]  [address]       Ph:  [phone]       Fax: [phone]   RxID:   [id number] FLONASE 50 MCG/ACT SUSP (FLUTICASONE PROPIONATE) 2 sprays in each nostril once daily  #30 x 11   Entered and Authorized by:   Nelwyn Salisbury MD   Signed by:   Nelwyn Salisbury MD on 05/29/2010   Method used:    Electronically to        [facility] [phone]* (retail)       [address] [place], [place]  [address]       Ph: [phone]       Fax: [phone]   RxID:   [id number]    Orders Added: 1)  Est. Patient Level IV [01601]

## 2010-09-13 NOTE — Progress Notes (Signed)
Summary: INR Panic Value  Phone Note Call from Patient   Caller: Patient Call For: Nelwyn Salisbury MD Summary of Call: URGENT call sent to [facility] regarding Panic Value INR......Marland KitchenPt is calling Initial call taken by: Lynann Beaver CMA,  August 30, 2009 11:04 AM  Follow-up for Phone Call        our lab was supposed to have called him, but tell him to hold the Coumadin for 3 days, then resume taking it at 5 mg a day. recheck an INR in 2 weeks Follow-up by: Nelwyn Salisbury MD,  August 30, 2009 12:32 PM  Additional Follow-up for Phone Call Additional follow up Details #1::        pt aware Additional Follow-up by: Alfred Levins, CMA,  August 30, 2009 12:41 PM

## 2010-09-13 NOTE — Assessment & Plan Note (Signed)
Summary: COUGH // RS   Vital Signs:  Patient profile:   77 year old male Weight:      196 pounds O2 Sat:      98 % on Room air Temp:     97.5 degrees F oral Pulse rate:   92 / minute BP sitting:   110 / 84  (left arm) Cuff size:   regular  Vitals Entered By: Romualdo Bolk, CMA (AAMA) (July 27, 2010 10:48 AM)  O2 Flow:  Room air CC: Coughing that has been going on x 1 day, no congestion   History of Present Illness: here for 3 days of chest congestion and coughing up green sputum. No chest pain or SOB or fever.   Preventive Screening-Counseling & Management  Alcohol-Tobacco     Smoking Status: never  Current Medications (verified): 1)  Coumadin 5 Mg Tabs (Warfarin Sodium) .... As Directed 2)  Toprol Xl 25 Mg Tb24 (Metoprolol Succinate) .... Take 1/2 Once Daily 3)  Simvastatin 40 Mg Tabs (Simvastatin) .... Take 1 Tab Everyday 4)  Fish Oil 1000 Mg Caps (Omega-3 Fatty Acids) .... Take 2 Capsules Daily 5)  Fenofibrate 160 Mg Tabs (Fenofibrate) .[name].. 1 By Mouth Once Daily 6)  Flonase 50 Mcg/act Susp (Fluticasone Propionate) .... 2 Sprays in Each Nostril Once Daily  Allergies (verified): No Known Drug Allergies  Past History:  Past Medical History: Reviewed history from 05/17/2010 and no changes required. Hypertension Hyperlipidemia Atrial fibrillation, sees Dr. Tenny Craw Osteoarthritis hemorrhoids Ureteral cancer Tubulovillous adenoma of colon 2008  Low back pain, sees Dr. Jeral Fruit sees Dr. Aldean Ast once a year for prostate exams  Past Surgical History: Reviewed history from 05/17/2010 and no changes required. Appendectomy Tonsillectomy Repair of bilateral inguinal herinas Nephrectomy left and ureterectomy 04/20/09 per Dr. Heloise Purpura  Review of Systems  The patient denies anorexia, fever, weight loss, weight gain, vision loss, decreased hearing, hoarseness, chest pain, syncope, dyspnea on exertion, peripheral edema, headaches, hemoptysis, abdominal pain,  melena, hematochezia, severe indigestion/heartburn, hematuria, incontinence, genital sores, muscle weakness, suspicious skin lesions, transient blindness, difficulty walking, depression, unusual weight change, abnormal bleeding, enlarged lymph nodes, angioedema, breast masses, and testicular masses.    Physical Exam  General:  Well-developed,well-nourished,in no acute distress; alert,appropriate and cooperative throughout examination Head:  Normocephalic and atraumatic without obvious abnormalities. No apparent alopecia or balding. Eyes:  No corneal or conjunctival inflammation noted. EOMI. Perrla. Funduscopic exam benign, without hemorrhages, exudates or papilledema. Vision grossly normal. Ears:  External ear exam shows no significant lesions or deformities.  Otoscopic examination reveals clear canals, tympanic membranes are intact bilaterally without bulging, retraction, inflammation or discharge. Hearing is grossly normal bilaterally. Nose:  External nasal examination shows no deformity or inflammation. Nasal mucosa are pink and moist without lesions or exudates. Mouth:  Oral mucosa and oropharynx without lesions or exudates.  Teeth in good repair. Neck:  No deformities, masses, or tenderness noted. Lungs:  scattered rhonchi, no rales    Impression & Recommendations:  Problem # 1:  ACUTE BRONCHITIS (ICD-466.0)  His updated medication list for this problem includes:    Augmentin 875-125 Mg Tabs (Amoxicillin-pot clavulanate) .[name]..[name] Two times a day  Orders: T-2 View CXR (71020TC)  Complete Medication List: 1)  Coumadin 5 Mg Tabs (Warfarin sodium) .... As directed 2)  Toprol Xl 25 Mg Tb24 (Metoprolol succinate) .... Take 1/2 once daily 3)  Simvastatin 40 Mg Tabs (Simvastatin) .... Take 1 tab everyday 4)  Fish Oil 1000 Mg Caps (Omega-3 fatty acids) .... Take  2 capsules daily 5)  Fenofibrate 160 Mg Tabs (Fenofibrate) .[name].. 1 by mouth once daily 6)  Flonase 50 Mcg/act Susp (Fluticasone  propionate) .... 2 sprays in each nostril once daily 7)  Augmentin 875-125 Mg Tabs (Amoxicillin-pot clavulanate) .... Two times a day  Patient Instructions: 1)  get a CXR today  Prescriptions: AUGMENTIN 875-125 MG TABS (AMOXICILLIN-POT CLAVULANATE) two times a day  #20 x 0   Entered and Authorized by:   Nelwyn Salisbury MD   Signed by:   Nelwyn Salisbury MD on 07/27/2010   Method used:   Electronically to        [facility] [phone]* (retail)       [address] [place], [place]  [address]       Ph: [phone]       Fax: [phone]   RxID:   [id number]    Orders Added: 1)  Est. Patient Level IV [03474] 2)  T-2 View CXR [71020TC]

## 2010-09-13 NOTE — Letter (Signed)
Summary: [facility]-TIA  [facility]-TIA   Imported By: Maryln Gottron 08/07/2010 10:44:59  _____________________________________________________________________  External Attachment:    Type:   Image     Comment:   External Document

## 2010-09-13 NOTE — Assessment & Plan Note (Signed)
Summary: PT / RS   Nurse Visit   Allergies: No Known Drug Allergies Laboratory Results   Blood Tests      INR: 2.7   (Normal Range: 0.88-1.12   Therap INR: 2.0-3.5) Comments: [name]  June 05, 2010 2:05 PM     Orders Added: 1)  Est. Patient Level I [99211] 2)  Protime [[id number]]   ANTICOAGULATION RECORD PREVIOUS REGIMEN & LAB RESULTS Anticoagulation Diagnosis:  Atrial fibrillation,v58.61,v58.83 on  03/13/2007 Previous INR Goal Range:  2.5-3.5 on  03/13/2007 Previous INR:  3.1 on  05/01/2010 Previous Coumadin Dose(mg):  5 mg M-F, 2.5 mg Sat & Sun on  11/07/2009 Previous Regimen:  same on  05/01/2010  NEW REGIMEN & LAB RESULTS Current INR: 2.7 Regimen: same  Repeat testing in: 4 weeks  Anticoagulation Visit Questionnaire Coumadin dose missed/changed:  No Abnormal Bleeding Symptoms:  No  Any diet changes including alcohol intake, vegetables or greens since the last visit:  No Any illnesses or hospitalizations since the last visit:  No Any signs of clotting since the last visit (including chest discomfort, dizziness, shortness of breath, arm tingling, slurred speech, swelling or redness in leg):  No  MEDICATIONS COUMADIN 5 MG TABS (WARFARIN SODIUM) as directed TOPROL XL 25 MG TB24 (METOPROLOL SUCCINATE) take 1/2 once daily VYTORIN 10-80 MG TABS (EZETIMIBE-SIMVASTATIN) 1/2 tablet once daily FISH OIL 1000 MG CAPS (OMEGA-3 FATTY ACIDS) take 2 capsules daily FENOFIBRATE 160 MG TABS (FENOFIBRATE) 1 by mouth once daily MOVIPREP 100 GM  SOLR (PEG-KCL-NACL-NASULF-NA ASC-C) As per prep instructions. FLONASE 50 MCG/ACT SUSP (FLUTICASONE PROPIONATE) 2 sprays in each nostril once daily

## 2010-09-13 NOTE — Assessment & Plan Note (Signed)
Summary: 1 week fup with pt/ccm   Vital Signs:  Patient profile:   77 year old male Temp:     98.0 degrees F oral BP sitting:   120 / 78  (left arm) Cuff size:   regular  Vitals Entered By: Sid Falcon LPN (August 07, 2010 11:15 AM)  History of Present Illness: Here to follow up on bronchitis, and to recheck his INR. he has been taking 7.5 mg a day of Coumadin for the past week. His INR today is 4. He has almost finished the Levaquin, and he feels better. Just an occasional dry cough now. No more neurologic symptoms since we last spoke.   Allergies: 1)  ! Augmentin  Past History:  Past Medical History: Reviewed history from 07/31/2010 and no changes required. Hypertension Hyperlipidemia Atrial fibrillation, sees Dr. Tenny Craw Osteoarthritis hemorrhoids Ureteral cancer Tubulovillous adenoma of colon 2008 Low back pain, sees Dr. Jeral Fruit sees Dr. Aldean Ast once a year for prostate exams TIA 07-28-10  Review of Systems  The patient denies anorexia, fever, weight loss, weight gain, vision loss, decreased hearing, hoarseness, chest pain, syncope, dyspnea on exertion, peripheral edema, prolonged cough, headaches, hemoptysis, abdominal pain, melena, hematochezia, severe indigestion/heartburn, hematuria, incontinence, genital sores, muscle weakness, suspicious skin lesions, transient blindness, difficulty walking, depression, unusual weight change, abnormal bleeding, enlarged lymph nodes, angioedema, breast masses, and testicular masses.    Physical Exam  General:  Well-developed,well-nourished,in no acute distress; alert,appropriate and cooperative throughout examination Head:  Normocephalic and atraumatic without obvious abnormalities. No apparent alopecia or balding. Eyes:  No corneal or conjunctival inflammation noted. EOMI. Perrla. Funduscopic exam benign, without hemorrhages, exudates or papilledema. Vision grossly normal. Ears:  External ear exam shows no significant lesions or  deformities.  Otoscopic examination reveals clear canals, tympanic membranes are intact bilaterally without bulging, retraction, inflammation or discharge. Hearing is grossly normal bilaterally. Nose:  External nasal examination shows no deformity or inflammation. Nasal mucosa are pink and moist without lesions or exudates. Mouth:  Oral mucosa and oropharynx without lesions or exudates.  Teeth in good repair. Neck:  No deformities, masses, or tenderness noted. Lungs:  Normal respiratory effort, chest expands symmetrically. Lungs are clear to auscultation, no crackles or wheezes. Heart:  Normal rate and regular rhythm. S1 and S2 normal without gallop, murmur, click, rub or other extra sounds. Neurologic:  No cranial nerve deficits noted. Station and gait are normal. Plantar reflexes are down-going bilaterally. DTRs are symmetrical throughout. Sensory, motor and coordinative functions appear intact.   Impression & Recommendations:  Problem # 1:  TRANSIENT ISCHEMIC ATTACK (ICD-435.9)  His updated medication list for this problem includes:    Coumadin 5 Mg Tabs (Warfarin sodium) .[name]... As directed  Problem # 2:  ACUTE BRONCHITIS (ICD-466.0)  Complete Medication List: 1)  Coumadin 5 Mg Tabs (Warfarin sodium) .... As directed 2)  Toprol Xl 25 Mg Tb24 (Metoprolol succinate) .... Take 1/2 once daily 3)  Simvastatin 40 Mg Tabs (Simvastatin) .... Take 1 tab everyday 4)  Fish Oil 1000 Mg Caps (Omega-3 fatty acids) .... Take 2 capsules daily 5)  Fenofibrate 160 Mg Tabs (Fenofibrate) .[name].. 1 by mouth once daily 6)  Flonase 50 Mcg/act Susp (Fluticasone propionate) .... 2 sprays in each nostril once daily  Other Orders: Fingerstick (16109) Protime ([id number])  Patient Instructions: 1)  Finish up the Levaquin. Adjust Coumadin as instructed. I advised him to not travel to [place] this weekend, and he agreed to stay home. Recheck INR in one week.    Orders  Added: 1)  Fingerstick [36416] 2)  Protime  [[id number]] 3)  Est. Patient Level IV [82956]     ANTICOAGULATION RECORD PREVIOUS REGIMEN & LAB RESULTS Anticoagulation Diagnosis:  Atrial fibrillation,v58.61,v58.83 on  03/13/2007 Previous INR Goal Range:  2.5-3.5 on  03/13/2007 Previous INR:  2.4 on  07/10/2010 Previous Coumadin Dose(mg):  5 mg M-F, 2.5 mg Sat & Sun on  11/07/2009 Previous Regimen:  same on  07/10/2010  NEW REGIMEN & LAB RESULTS Current INR: 4.6 Regimen: same  (no change)   Anticoagulation Visit Questionnaire Coumadin dose missed/changed:  Yes Coumadin Dose Comments:  4 or 5 Abnormal Bleeding Symptoms:  No  Any diet changes including alcohol intake, vegetables or greens since the last visit:  No Any illnesses or hospitalizations since the last visit:  Yes Any signs of clotting since the last visit (including chest discomfort, dizziness, shortness of breath, arm tingling, slurred speech, swelling or redness in leg):  No  MEDICATIONS COUMADIN 5 MG TABS (WARFARIN SODIUM) as directed TOPROL XL 25 MG TB24 (METOPROLOL SUCCINATE) take 1/2 once daily SIMVASTATIN 40 MG TABS (SIMVASTATIN) take 1 tab everyday FISH OIL 1000 MG CAPS (OMEGA-3 FATTY ACIDS) take 2 capsules daily FENOFIBRATE 160 MG TABS (FENOFIBRATE) 1 by mouth once daily FLONASE 50 MCG/ACT SUSP (FLUTICASONE PROPIONATE) 2 sprays in each nostril once daily    Laboratory Results   Blood Tests      INR: 4.6   (Normal Range: 0.88-1.12   Therap INR: 2.0-3.5) Comments: [name]  August 07, 2010 10:59 AM      Appended Document: 1 week fup with pt/ccm   ANTICOAGULATION RECORD PREVIOUS REGIMEN & LAB RESULTS Anticoagulation Diagnosis:  Atrial fibrillation,v58.61,v58.83 on  03/13/2007 Previous INR Goal Range:  2.5-3.5 on  03/13/2007 Previous INR:  4.6 on  08/07/2010 Previous Coumadin Dose(mg):  5 mg M-F, 2.5 mg Sat & Sun on  11/07/2009 Previous Regimen:  same on  07/10/2010  NEW REGIMEN & LAB RESULTS Regimen: skip 3 days then  resume  Repeat testing in: 1 week

## 2010-09-13 NOTE — Assessment & Plan Note (Signed)
Summary: pt/njr   Nurse Visit   Allergies: 1)  ! Augmentin Laboratory Results   Blood Tests      INR: 1.7   (Normal Range: 0.88-1.12   Therap INR: 2.0-3.5) Comments: [name]  August 14, 2010 2:12 PM     Orders Added: 1)  Est. Patient Level I [99211] 2)  Protime [[id number]]   ANTICOAGULATION RECORD PREVIOUS REGIMEN & LAB RESULTS Anticoagulation Diagnosis:  Atrial fibrillation,v58.61,v58.83 on  03/13/2007 Previous INR Goal Range:  2.5-3.5 on  03/13/2007 Previous INR:  4.6 on  08/07/2010 Previous Coumadin Dose(mg):  5 mg M-F, 2.5 mg Sat & Sun on  11/07/2009 Previous Regimen:  skip 3 days then resume on  08/07/2010  NEW REGIMEN & LAB RESULTS Current INR: 1.7 Regimen: same Coagulation Comments: Approved by Dr. Fabian Sharp Repeat testing in: 1 week  Anticoagulation Visit Questionnaire Coumadin dose missed/changed:  No Abnormal Bleeding Symptoms:  No  Any diet changes including alcohol intake, vegetables or greens since the last visit:  No Any illnesses or hospitalizations since the last visit:  No Any signs of clotting since the last visit (including chest discomfort, dizziness, shortness of breath, arm tingling, slurred speech, swelling or redness in leg):  Yes  MEDICATIONS COUMADIN 5 MG TABS (WARFARIN SODIUM) as directed TOPROL XL 25 MG TB24 (METOPROLOL SUCCINATE) take 1/2 once daily SIMVASTATIN 40 MG TABS (SIMVASTATIN) take 1 tab everyday FISH OIL 1000 MG CAPS (OMEGA-3 FATTY ACIDS) take 2 capsules daily FENOFIBRATE 160 MG TABS (FENOFIBRATE) 1 by mouth once daily FLONASE 50 MCG/ACT SUSP (FLUTICASONE PROPIONATE) 2 sprays in each nostril once daily

## 2010-09-13 NOTE — Progress Notes (Signed)
Summary: rx  Phone Note Refill Request   Refills Requested: Medication #1:  COUMADIN 5 MG TABS 1 by mouth once daily [facility]--[place] phone----[phone]       fax-----[phone]  Initial call taken by: Warnell Forester,  August 17, 2009 10:04 AM  Follow-up for Phone Call        Rx called to pharmacy Follow-up by: Alfred Levins, CMA,  August 17, 2009 11:48 AM    Prescriptions: COUMADIN 5 MG TABS (WARFARIN SODIUM) 1 by mouth once daily  #30 x 11   Entered by:   Alfred Levins, CMA   Authorized by:   Nelwyn Salisbury MD   Signed by:   Alfred Levins, CMA on 08/17/2009   Method used:   Electronically to        [facility] [phone]* (retail)       [address] [place], [place]  [address]       Ph: [phone]       Fax: [phone]   RxID:   [id number]

## 2010-09-13 NOTE — Assessment & Plan Note (Signed)
Summary: pt//ccm   Nurse Visit   Allergies: No Known Drug Allergies Laboratory Results   Blood Tests      INR: 2.3   (Normal Range: 0.88-1.12   Therap INR: 2.0-3.5) Comments: Justin Gallegos  January 30, 2010 2:12 PM     Orders Added: 1)  Est. Patient Level I [99211] 2)  Protime [[id number]]    ANTICOAGULATION RECORD PREVIOUS REGIMEN & LAB RESULTS Anticoagulation Diagnosis:  Atrial fibrillation,v58.61,v58.83 on  03/13/2007 Previous INR Goal Range:  2.5-3.5 on  03/13/2007 Previous INR:  2.9 on  01/02/2010 Previous Coumadin Dose(mg):  5 mg M-F, 2.5 mg Sat & Sun on  11/07/2009 Previous Regimen:  same on  01/02/2010  NEW REGIMEN & LAB RESULTS Current INR: 2.3 Regimen: same  Repeat testing in: 4 weeks  Anticoagulation Visit Questionnaire Coumadin dose missed/changed:  No Abnormal Bleeding Symptoms:  No  Any diet changes including alcohol intake, vegetables or greens since the last visit:  No Any illnesses or hospitalizations since the last visit:  No Any signs of clotting since the last visit (including chest discomfort, dizziness, shortness of breath, arm tingling, slurred speech, swelling or redness in leg):  No  MEDICATIONS COUMADIN 5 MG TABS (WARFARIN SODIUM) as directed TOPROL XL 25 MG TB24 (METOPROLOL SUCCINATE) take 1/2 once daily VYTORIN 10-80 MG TABS (EZETIMIBE-SIMVASTATIN) 1/2 tablet once daily MULTIVITAMINS   TABS (MULTIPLE VITAMIN) 1 by mouth once daily FISH OIL 1000 MG CAPS (OMEGA-3 FATTY ACIDS) take 2 capsules daily FENOFIBRATE 160 MG TABS (FENOFIBRATE) 1 by mouth once daily

## 2010-09-13 NOTE — Assessment & Plan Note (Signed)
Summary: pt/njr   Nurse Visit   Allergies: No Known Drug Allergies Laboratory Results   Blood Tests   Date/Time Received: November 21, 2009 2:00 PM  Date/Time Reported: November 21, 2009 2:00 PM   PT: 18.4 s   (Normal Range: 10.6-13.4)  INR: 2.3   (Normal Range: 0.88-1.12   Therap INR: 2.0-3.5) Comments: Wynona Canes, CMA  November 21, 2009 2:00 PM     Orders Added: 1)  Est. Patient Level I [16109] 2)  Protime [[id number]]  Laboratory Results   Blood Tests     PT: 18.4 s   (Normal Range: 10.6-13.4)  INR: 2.3   (Normal Range: 0.88-1.12   Therap INR: 2.0-3.5) Comments: Wynona Canes, CMA  November 21, 2009 2:00 PM       ANTICOAGULATION RECORD PREVIOUS REGIMEN & LAB RESULTS Anticoagulation Diagnosis:  Atrial fibrillation,v58.61,v58.83 on  03/13/2007 Previous INR Goal Range:  2.5-3.5 on  03/13/2007 Previous INR:  1.8 on  11/07/2009 Previous Coumadin Dose(mg):  5 mg M-F, 2.5 mg Sat & Sun on  11/07/2009 Previous Regimen:  2.5mg .tues, thurs sat others 5mg . on  10/23/2009  NEW REGIMEN & LAB RESULTS Current INR: 2.3 Regimen: same dose       Repeat testing in: 2 weeks MEDICATIONS COUMADIN 5 MG TABS (WARFARIN SODIUM) as directed TOPROL XL 25 MG TB24 (METOPROLOL SUCCINATE) take 1/2 once daily VYTORIN 10-80 MG TABS (EZETIMIBE-SIMVASTATIN) 1/2 tablet once daily MULTIVITAMINS   TABS (MULTIPLE VITAMIN) 1 by mouth once daily FISH OIL 1000 MG CAPS (OMEGA-3 FATTY ACIDS) take 2 capsules daily FENOFIBRATE 160 MG TABS (FENOFIBRATE) 1 by mouth once daily   Anticoagulation Visit Questionnaire      Coumadin dose missed/changed:  No      Abnormal Bleeding Symptoms:  No   Any diet changes including alcohol intake, vegetables or greens since the last visit:  No Any illnesses or hospitalizations since the last visit:  No Any signs of clotting since the last visit (including chest discomfort, dizziness, shortness of breath, arm tingling, slurred speech, swelling or redness in leg):   No

## 2010-09-13 NOTE — Letter (Signed)
Summary: [facility]  [facility]   Imported By: Maryln Gottron 01/17/2010 14:09:34  _____________________________________________________________________  External Attachment:    Type:   Image     Comment:   External Document

## 2010-09-13 NOTE — Assessment & Plan Note (Signed)
Summary: pt/njr   Nurse Visit   Allergies: No Known Drug Allergies Laboratory Results   Blood Tests      INR: 3.1   (Normal Range: 0.88-1.12   Therap INR: 2.0-3.5) Comments: [name]  April 03, 2010 2:22 PM     Orders Added: 1)  Est. Patient Level I [99211] 2)  Protime [16109UE]   ANTICOAGULATION RECORD PREVIOUS REGIMEN & LAB RESULTS Anticoagulation Diagnosis:  Atrial fibrillation,v58.61,v58.83 on  03/13/2007 Previous INR Goal Range:  2.5-3.5 on  03/13/2007 Previous INR:  2.9 on  03/06/2010 Previous Coumadin Dose(mg):  5 mg M-F, 2.5 mg Sat & Sun on  11/07/2009 Previous Regimen:  same on  03/06/2010  NEW REGIMEN & LAB RESULTS Current INR: 3.1 Regimen: same  Repeat testing in: 4 weeks  Anticoagulation Visit Questionnaire Coumadin dose missed/changed:  No Abnormal Bleeding Symptoms:  No  Any diet changes including alcohol intake, vegetables or greens since the last visit:  No Any illnesses or hospitalizations since the last visit:  No Any signs of clotting since the last visit (including chest discomfort, dizziness, shortness of breath, arm tingling, slurred speech, swelling or redness in leg):  Yes  MEDICATIONS COUMADIN 5 MG TABS (WARFARIN SODIUM) as directed TOPROL XL 25 MG TB24 (METOPROLOL SUCCINATE) take 1/2 once daily VYTORIN 10-80 MG TABS (EZETIMIBE-SIMVASTATIN) 1/2 tablet once daily MULTIVITAMINS   TABS (MULTIPLE VITAMIN) 1 by mouth once daily FISH OIL 1000 MG CAPS (OMEGA-3 FATTY ACIDS) take 2 capsules daily FENOFIBRATE 160 MG TABS (FENOFIBRATE) 1 by mouth once daily

## 2010-09-13 NOTE — Letter (Signed)
Summary: Patient Notice- Polyp Results   Gastroenterology  [address] [place], [place] [address]   Phone: [phone]  Fax: [phone]        July 02, 2010 MRN: [id number]    Justin Gallegos [address] [place], [place]  [address]    Dear Mr. EVERITT,  One of the polyps removed from your colon were adenomatous. This means that they were pre-cancerous or that  they had the potential to change into cancer over time. Fortunately, it is removed and the other polyp was not pre-cancerous.  I do not recommend scheduling a routine repeat examination of the colon at this time. At age 77 and with the findings here, the risk-benefit ratio foes not seem to favor repeating a colonoscopy. The earliest it would be is 2016. You would be 86 then.  If you develop any new rectal bleeding, abdominal pain or significant bowel habit changes, please contact us before then.  Please call us if you are having persistent problems or have questions about your condition that have not been fully answered at this time.  Sincerely,  Iva Boop MD, [facility]  This letter has been electronically signed by your physician.  Appended Document: Patient Notice- Polyp Results Letter mailed

## 2010-09-13 NOTE — Assessment & Plan Note (Signed)
Summary: cipro fup//ccm   Vital Signs:  Patient profile:   77 year old male Weight:      194 pounds BP sitting:   110 / 84  (left arm) Cuff size:   regular  Vitals Entered By: Raechel Ache, RN (March 28, 2010 1:19 PM) CC: F/u, feels better but back still sore.   History of Present Illness: He is almost finished with a 14 days course of Cipro. he feels much better, has no abdominal discimfort, no fever. His appetite is back to normal. No urinary or bowel complaints.   Allergies: No Known Drug Allergies  Past History:  Past Medical History: Reviewed history from 03/02/2009 and no changes required. Hypertension Hyperlipidemia Atrial fibrillation, sees Dr. Tenny Craw Osteoarthritis hemorrhoids Low back pain, sees Dr. Jeral Fruit sees Dr. Aldean Ast once a year for prostate exams  Past Surgical History: Reviewed history from 04/28/2009 and no changes required. Appendectomy Tonsillectomy Repair of bilateral inguinal herinas colonoscopy 03-13-07 per Dr. Leone Payor, repeat in 3 years Nephrectomy left and ureterectomy 04/20/09 per Dr. Heloise Purpura  Review of Systems  The patient denies anorexia, fever, weight loss, weight gain, vision loss, decreased hearing, hoarseness, chest pain, syncope, dyspnea on exertion, peripheral edema, prolonged cough, headaches, hemoptysis, abdominal pain, melena, hematochezia, severe indigestion/heartburn, hematuria, incontinence, genital sores, muscle weakness, suspicious skin lesions, transient blindness, difficulty walking, depression, unusual weight change, abnormal bleeding, enlarged lymph nodes, angioedema, breast masses, and testicular masses.    Physical Exam  General:  Well-developed,well-nourished,in no acute distress; alert,appropriate and cooperative throughout examination Abdomen:  Bowel sounds positive,abdomen soft and non-tender without masses, organomegaly or hernias noted.   Impression & Recommendations:  Problem # 1:  ACUTE PROSTATITIS  (ICD-601.0)  Complete Medication List: 1)  Coumadin 5 Mg Tabs (Warfarin sodium) .... As directed 2)  Toprol Xl 25 Mg Tb24 (Metoprolol succinate) .... Take 1/2 once daily 3)  Vytorin 10-80 Mg Tabs (Ezetimibe-simvastatin) .... 1/2 tablet once daily 4)  Multivitamins Tabs (Multiple vitamin) .[name].. 1 by mouth once daily 5)  Fish Oil 1000 Mg Caps (Omega-3 fatty acids) .... Take 2 capsules daily 6)  Fenofibrate 160 Mg Tabs (Fenofibrate) .[name].. 1 by mouth once daily 7)  Cipro 500 Mg Tabs (Ciprofloxacin hcl) .... Two times a day  Patient Instructions: 1)  it seems to be resolved. Finish out the Cipro.  2)  Please schedule a follow-up appointment as needed .

## 2010-09-13 NOTE — Letter (Signed)
Summary: Anticoagulation Modification Letter  [facility]  [address] [place], [place] [address]   Phone: [phone]  Fax: [phone]    May 17, 2010  Re:    Justin Gallegos DOB:    04-Feb-1929 MRN:  [id number]    Dear Dr. Tenny Craw:  We have scheduled the above patient for an endoscopic procedure with Dr. Leone Payor. Our records show that he is on anticoagulation therapy. Please advise as to how long the patient may come off their therapy of Coumadin prior to the scheduled procedure on June 28, 2010.  Please append route the completed form to Francee Piccolo, CMA (AAMA)  Thank you for your help with this matter.  Sincerely,  Francee Piccolo CMA [facility])   Physician Recommendation:  Hold Coumadin 5 days prior ____________  Other ______________________________     Appended Document: Anticoagulation Modification Letter OK to hold coumadin 5 days prior.  Resume after.  Appended Document: Anticoagulation Modification Letter Pt notified of above.  He is able to repeat instructions.  Hardcopy printed and will be filed in [facility] chart.

## 2010-09-13 NOTE — Progress Notes (Signed)
Summary: Colon Tomorrow   Phone Note Call from Patient   Caller: Patient Call For: Dr. Leone Payor Details for Reason: Colonoscopy Tomorrow Summary of Call: Please call pt. regarding colonoscopy tomorrow.  States he ate some things he shouldn't have. Initial call taken by: Schuyler Amor,  June 27, 2010 9:10 AM  Follow-up for Phone Call        Pt states he didn't follow diet as instructed, ate popcorn and carrots.  Pt told to really push fluids today, drink as much as possible and follow prep as instructed.  Pt voiced understanding. Follow-up by: Karl Bales RN,  June 27, 2010 9:17 AM

## 2010-09-13 NOTE — Procedures (Signed)
Summary: Colonoscopy: Adenoma   Colonoscopy  Procedure date:  03/13/2007  Findings:      Comments: 1) 1 CM SIGMOID POLYP REMOVED 2) DIVERTICULOSIS LEFT AND RIGHT COLON 3) INT/EXT HEMORRHOIDS 4) EXCELLENT PREP  ***MICROSCOPIC EXAMINATION AND DIAGNOSIS***    COLON, SIGMOID, POLYP(S): TUBULOVILLOUS ADENOMA. NO HIGH GRADE DYSPLASIA IS IDENTIFIED.  Procedures Next Due Date:    Colonoscopy: 03/2010  Patient Name: Justin Gallegos, Justin Gallegos. MRN: [id number] Procedure Procedures: Colonoscopy CPT: [phone].    with polypectomy. CPT: [id number].  Personnel: Endoscopist: Iva Boop, MD, [facility].  Referred By: Gershon Crane, MD.  Exam Location: Exam performed in Outpatient Clinic. Outpatient  Patient Consent: Procedure, Alternatives, Risks and Benefits discussed, consent obtained, from patient. Consent was obtained by the RN.  Indications  Average Risk Screening Routine.  History  Current Medications: Patient is currently taking Coumadin. AC Plan: Stop Coumadin, no blood work.  Allergies: No known allergies.  Pre-Exam Physical: Performed Mar 13, 2007. Cardio-pulmonary exam abnormal. Rectal exam, HEENT exam , Abdominal exam, Mental status exam WNL. Abnormal PE findings include: intermittent Afib.  Comments: Pt. history reviewed/updated, physical exam performed prior to initiation of sedation? YES Prostate slightly enlarged, no nodules Exam Exam: Extent of exam reached: Cecum, extent intended: Cecum.  The cecum was identified by appendiceal orifice and IC valve. Patient position: on left side. Time to Cecum: 00:03:33. Time for Withdrawl: 00:09:45. Colon retroflexion performed. Images taken. ASA Classification: III. Tolerance: excellent.  Monitoring: Pulse and BP monitoring, Oximetry used. Supplemental O2 given.  Colon Prep Used MoviPrep for colon prep. Prep results: excellent.  Sedation Meds: Patient assessed and found to be appropriate for moderate (conscious) sedation. Fentanyl 50  mcg. given IV. Versed 7 mg. given IV.  Findings - NORMAL EXAM: Hepatic Flexure to Descending Colon.  - DIVERTICULOSIS: Ascending Colon.  - DIVERTICULOSIS: Sigmoid Colon.  - NORMAL EXAM: Cecum.  POLYP: Sigmoid Colon, Maximum size: 10 mm. sessile polyp. Distance from Anus 25 cm. Procedure:  snare with cautery, removed, retrieved, Polyp sent to pathology.  HEMORRHOIDS: Internal and External. Size: Grade I.   Assessment  Comments: 1) 1 CM SIGMOID POLYP REMOVED 2) DIVERTICULOSIS LEFT AND RIGHT COLON 3) INT/EXT HEMORRHOIDS 4) EXCELLENT PREP  Events  Unplanned Interventions: No intervention was required.  Plans  Post Exam Instructions: Restart medications: COUMADIN TONIGHT.  Patient Education: Patient given standard instructions for: Polyps. Diverticulosis. Hemorrhoids.  Disposition: After procedure patient sent to recovery. After recovery patient sent home.  Scheduling/Referral: Await pathology to schedule patient. Colonoscopy, to Iva Boop, MD, [facility], 3-5 YRS,   Comments: SUSPECT THIS IS ADENOMATOUS POLYP, IF NOT WOULD F/U PRN  CC:   Gershon Crane, MD  This report was created from the original endoscopy report, which was reviewed and signed by the above listed endoscopist.

## 2010-09-13 NOTE — Assessment & Plan Note (Signed)
Summary: yearly/sl   Visit Type:  Follow-up Primary Provider:  Gershon Crane, MD  CC:  ROV; No Complaints.  History of Present Illness: Patient is a 77 year old with a history of atrial fibrillation and dyslipidiemia.  He was last seen in January 2010 SInce seen he denies chest pains.  Breathing is OK.  NO dizziness.  No palpitaitons.   He does note that Vytorin will become tier 4 by his insurance.  Current Medications (verified): 1)  Coumadin 5 Mg Tabs (Warfarin Sodium) .... As Directed 2)  Toprol Xl 25 Mg Tb24 (Metoprolol Succinate) .... Take 1/2 Once Daily 3)  Vytorin 10-80 Mg Tabs (Ezetimibe-Simvastatin) .... 1/2 Tablet Once Daily 4)  Fish Oil 1000 Mg Caps (Omega-3 Fatty Acids) .... Take 2 Capsules Daily 5)  Fenofibrate 160 Mg Tabs (Fenofibrate) .[name].. 1 By Mouth Once Daily 6)  Flonase 50 Mcg/act Susp (Fluticasone Propionate) .... 2 Sprays in Each Nostril Once Daily  Allergies (verified): No Known Drug Allergies  Past History:  Family History: Last updated: 06/11/2007 Family History of CAD Male 1st degree relative <50 Family History of Stroke M 1st degree relative <50  Social History: Last updated: 05/17/2010 Retired Married Never Smoked Alcohol use-no Illicit Drug Use - no  Risk Factors: Smoking Status: never (06/11/2007)  Past medical, surgical, family and social histories (including risk factors) reviewed, and no changes noted (except as noted below).  Past Medical History: Reviewed history from 05/17/2010 and no changes required. Hypertension Hyperlipidemia Atrial fibrillation, sees Dr. Tenny Craw Osteoarthritis hemorrhoids Ureteral cancer Tubulovillous adenoma of colon 2008  Low back pain, sees Dr. Jeral Fruit sees Dr. Aldean Ast once a year for prostate exams  Past Surgical History: Reviewed history from 05/17/2010 and no changes required. Appendectomy Tonsillectomy Repair of bilateral inguinal herinas Nephrectomy left and ureterectomy 04/20/09 per Dr. Heloise Purpura  Family History: Reviewed history from 06/11/2007 and no changes required. Family History of CAD Male 1st degree relative <50 Family History of Stroke M 1st degree relative <50  Social History: Reviewed history from 05/17/2010 and no changes required. Retired Married Never Smoked Alcohol use-no Illicit Drug Use - no  Review of Systems       all systems reveiwed.  Neg to the abvoe problem.  Vital Signs:  Patient profile:   77 year old male Height:      72 inches Weight:      196.75 pounds Pulse rate:   91 / minute Pulse rhythm:   irregular BP sitting:   150 / 82  (left arm) Cuff size:   regular  Vitals Entered By: Stanton Kidney, EMT-P (July 19, 2010 11:34 AM)  Physical Exam  Additional Exam:  patient is in NAD HEENT:  Normocephalic, atraumatic. EOMI, PERRLA.  Neck: JVP is normal. No thyromegaly. No bruits.  Lungs: clear to auscultation. No rales no wheezes.  Heart: Irregular rate and rhythm. Normal S1, S2. No S3.   No significant murmurs. PMI not displaced.  Abdomen:  Supple, nontender. Normal bowel sounds. No masses. No hepatomegaly.  Extremities:   Good distal pulses throughout. No lower extremity edema.  Musculoskeletal :moving all extremities.  Neuro:   alert and oriented x3. BP on my check :  130/   EKG  Procedure date:  07/19/2010  Findings:      Atrial fibrillation  91 bpm.  Impression & Recommendations:  Problem # 1:  ATRIAL FIBRILLATION (ICD-427.31) Keep on rate control and coumadin. His updated medication list for this problem includes:    Coumadin  5 Mg Tabs (Warfarin sodium) .[name]... As directed    Toprol Xl 25 Mg Tb24 (Metoprolol succinate) .[name]... Take 1/2 once daily  Problem # 2:  HYPERTENSION (ICD-401.9) WIll need to be followed. His updated medication list for this problem includes:    Toprol Xl 25 Mg Tb24 (Metoprolol succinate) .[name]... Take 1/2 once daily  Problem # 3:  HYPERLIPIDEMIA (ICD-272.4) WIll switch to simvistatin   He is on  fenofibrate now.  Tolerating.  Follow. His updated medication list for this problem includes:    Simvastatin 40 Mg Tabs (Simvastatin) .[name]... Take 1 tab everyday    Fenofibrate 160 Mg Tabs (Fenofibrate) .[name]... 1 by mouth once daily  Patient Instructions: 1)  Your physician recommends that you schedule a follow-up appointment in: 6 month 2)  Your physician has recommended you make the following change in your medication: please discontinue your vytorin and start simvastatin 40mg  1 tablet every pm Prescriptions: SIMVASTATIN 40 MG TABS (SIMVASTATIN) take 1 tab everyday  #30 x 10   Entered by:   Ledon Snare, RN   Authorized by:   Sherrill Raring, MD, [facility]   Signed by:   Ledon Snare, RN on 07/19/2010   Method used:   Electronically to        [facility] [phone]* (retail)       [address] [place], [place]  [address]       Ph: [phone]       Fax: [phone]   RxID:   [id number]

## 2010-09-13 NOTE — Assessment & Plan Note (Signed)
Summary: ROA/RCD/PT/NJR   Vital Signs:  Patient profile:   77 year old male Weight:      196 pounds BMI:     27.83 Temp:     98.1 degrees F oral Pulse rate:   96 / minute Pulse rhythm:   irregular BP sitting:   122 / 84  (left arm) Cuff size:   regular  Vitals Entered By: Raechel Ache, RN (October 16, 2009 2:20 PM) CC: ROV; Coumadin on hold.   History of Present Illness: Here to follow up on anticoagulation. Last week here his INR was 7.0, so we told him to hold all Coumadin until he sees [place] this week. Today it is down to 1.1.  His diet seems to be stable. He feels tired all the time. he has not had general labs done for about a year and a half.  Allergies: No Known Drug Allergies  Past History:  Past Medical History: Reviewed history from 03/02/2009 and no changes required. Hypertension Hyperlipidemia Atrial fibrillation, sees Dr. Tenny Craw Osteoarthritis hemorrhoids Low back pain, sees Dr. Jeral Fruit sees Dr. Aldean Ast once a year for prostate exams  Review of Systems  The patient denies anorexia, fever, weight loss, weight gain, vision loss, decreased hearing, hoarseness, chest pain, syncope, dyspnea on exertion, peripheral edema, prolonged cough, headaches, hemoptysis, abdominal pain, melena, hematochezia, severe indigestion/heartburn, hematuria, incontinence, genital sores, muscle weakness, suspicious skin lesions, transient blindness, difficulty walking, depression, unusual weight change, abnormal bleeding, enlarged lymph nodes, angioedema, breast masses, and testicular masses.    Physical Exam  General:  Well-developed,well-nourished,in no acute distress; alert,appropriate and cooperative throughout examination   Impression & Recommendations:  Problem # 1:  HYPERTENSION (ICD-401.9)  His updated medication list for this problem includes:    Toprol Xl 25 Mg Tb24 (Metoprolol succinate) .[name]... Take 1/2 once daily  Problem # 2:  ATRIAL FIBRILLATION (ICD-427.31)  His  updated medication list for this problem includes:    Coumadin 5 Mg Tabs (Warfarin sodium) .[name]... As directed    Toprol Xl 25 Mg Tb24 (Metoprolol succinate) .[name]... Take 1/2 once daily  Orders: Protime (95284XL) Fingerstick (24401)  Complete Medication List: 1)  Coumadin 5 Mg Tabs (Warfarin sodium) .... As directed 2)  Toprol Xl 25 Mg Tb24 (Metoprolol succinate) .... Take 1/2 once daily 3)  Vytorin 10-80 Mg Tabs (Ezetimibe-simvastatin) .... 1/2 tablet once daily 4)  Multivitamins Tabs (Multiple vitamin) .[name].. 1 by mouth once daily 5)  Fish Oil 1000 Mg Caps (Omega-3 fatty acids) .... Take 2 capsules daily 6)  Fenofibrate 160 Mg Tabs (Fenofibrate) .[name].. 1 by mouth once daily  Patient Instructions: 1)  Go to Coumadin 5 mg on Mon, Wed, and Fri with 2.5 mg on other days. Recheck in 2 weeks with complete fasting labs. Prescriptions: COUMADIN 5 MG TABS (WARFARIN SODIUM) as directed  #180 x 3   Entered and Authorized by:   Nelwyn Salisbury MD   Signed by:   Nelwyn Salisbury MD on 10/16/2009   Method used:   Electronically to        [facility] [phone]* (retail)       [address] [place], [place]  [address]       Ph: [phone]       Fax: [phone]   RxID:   [id number]    ANTICOAGULATION RECORD PREVIOUS REGIMEN & LAB RESULTS Anticoagulation Diagnosis:  Atrial fibrillation,v58.61,v58.83 on  03/13/2007 Previous INR Goal Range:  2.5-3.5 on  03/13/2007 Previous INR:  7.0 on  10/10/2009 Previous Coumadin Dose(mg):  5 mg qd on  02/16/2008 Previous Regimen:  Hold on  10/10/2009  NEW REGIMEN & LAB RESULTS Current INR: 1.1 Regimen: Hold  (no change)   Anticoagulation Visit Questionnaire Coumadin dose missed/changed:  No Abnormal Bleeding Symptoms:  No  Any diet changes including alcohol intake, vegetables or greens since the last visit:  No Any illnesses or hospitalizations since the last visit:  No Any signs of clotting since the last visit (including chest discomfort,  dizziness, shortness of breath, arm tingling, slurred speech, swelling or redness in leg):  No  MEDICATIONS COUMADIN 5 MG TABS (WARFARIN SODIUM) as directed TOPROL XL 25 MG TB24 (METOPROLOL SUCCINATE) take 1/2 once daily VYTORIN 10-80 MG TABS (EZETIMIBE-SIMVASTATIN) 1/2 tablet once daily MULTIVITAMINS   TABS (MULTIPLE VITAMIN) 1 by mouth once daily FISH OIL 1000 MG CAPS (OMEGA-3 FATTY ACIDS) take 2 capsules daily FENOFIBRATE 160 MG TABS (FENOFIBRATE) 1 by mouth once daily     Laboratory Results   Blood Tests      INR: 1.1   (Normal Range: 0.88-1.12   Therap INR: 2.0-3.5) Comments: [name]  October 16, 2009 1:51 PM

## 2010-09-13 NOTE — Assessment & Plan Note (Signed)
Summary: PT/RCD   Nurse Visit   Allergies: No Known Drug Allergies Laboratory Results   Blood Tests     PT: 19.0 s   (Normal Range: 10.6-13.4)  INR: 2.4   (Normal Range: 0.88-1.12   Therap INR: 2.0-3.5) Comments: [name]  September 12, 2009 2:06 PM     Orders Added: 1)  Est. Patient Level I [99211] 2)  Protime [[id number]]   ANTICOAGULATION RECORD PREVIOUS REGIMEN & LAB RESULTS Anticoagulation Diagnosis:  Atrial fibrillation,v58.61,v58.83 on  03/13/2007 Previous INR Goal Range:  2.5-3.5 on  03/13/2007 Previous INR:  7.2 ratio on  08/29/2009 Previous Coumadin Dose(mg):  5 mg qd on  02/16/2008 Previous Regimen:  stop until we get results from lab on  08/29/2009  NEW REGIMEN & LAB RESULTS Current INR: 2.4 Regimen: same  Repeat testing in: 1 month  Anticoagulation Visit Questionnaire Coumadin dose missed/changed:  No Abnormal Bleeding Symptoms:  No  Any diet changes including alcohol intake, vegetables or greens since the last visit:  No Any illnesses or hospitalizations since the last visit:  No Any signs of clotting since the last visit (including chest discomfort, dizziness, shortness of breath, arm tingling, slurred speech, swelling or redness in leg):  No  MEDICATIONS COUMADIN 5 MG TABS (WARFARIN SODIUM) 1 by mouth once daily TOPROL XL 25 MG TB24 (METOPROLOL SUCCINATE) take 1/2 once daily VYTORIN 10-80 MG TABS (EZETIMIBE-SIMVASTATIN) 1/2 tablet once daily MULTIVITAMINS   TABS (MULTIPLE VITAMIN) 1 by mouth once daily FISH OIL 1000 MG CAPS (OMEGA-3 FATTY ACIDS) take 2 capsules daily FENOFIBRATE 160 MG TABS (FENOFIBRATE) 1 by mouth once daily

## 2010-09-13 NOTE — Letter (Signed)
Summary: [facility]  [facility]   Imported By: Maryln Gottron 07/25/2010 15:17:15  _____________________________________________________________________  External Attachment:    Type:   Image     Comment:   External Document

## 2010-09-13 NOTE — Assessment & Plan Note (Signed)
Summary: PT/RCD   Nurse Visit   Allergies: No Known Drug Allergies Laboratory Results   Blood Tests     PT: 19.3 s   (Normal Range: 10.6-13.4)  INR: 2.5   (Normal Range: 0.88-1.12   Therap INR: 2.0-3.5) Comments: [name]  December 05, 2009 1:58 PM     Orders Added: 1)  Est. Patient Level I [99211] 2)  Protime [[id number]]   ANTICOAGULATION RECORD PREVIOUS REGIMEN & LAB RESULTS Anticoagulation Diagnosis:  Atrial fibrillation,v58.61,v58.83 on  03/13/2007 Previous INR Goal Range:  2.5-3.5 on  03/13/2007 Previous INR:  2.3 on  11/21/2009 Previous Coumadin Dose(mg):  5 mg M-F, 2.5 mg Sat & Sun on  11/07/2009 Previous Regimen:  same dose on  11/21/2009  NEW REGIMEN & LAB RESULTS Current INR: 2.5 Regimen: same  Repeat testing in: 1 month  Anticoagulation Visit Questionnaire Coumadin dose missed/changed:  No Abnormal Bleeding Symptoms:  No  Any diet changes including alcohol intake, vegetables or greens since the last visit:  No Any illnesses or hospitalizations since the last visit:  No Any signs of clotting since the last visit (including chest discomfort, dizziness, shortness of breath, arm tingling, slurred speech, swelling or redness in leg):  No  MEDICATIONS COUMADIN 5 MG TABS (WARFARIN SODIUM) as directed TOPROL XL 25 MG TB24 (METOPROLOL SUCCINATE) take 1/2 once daily VYTORIN 10-80 MG TABS (EZETIMIBE-SIMVASTATIN) 1/2 tablet once daily MULTIVITAMINS   TABS (MULTIPLE VITAMIN) 1 by mouth once daily FISH OIL 1000 MG CAPS (OMEGA-3 FATTY ACIDS) take 2 capsules daily FENOFIBRATE 160 MG TABS (FENOFIBRATE) 1 by mouth once daily

## 2010-09-13 NOTE — Letter (Signed)
Summary: [facility] Instructions  [facility]  [address] [place], [place] [address]   Phone: [phone]  Fax: [phone]       Justin Gallegos    12-15-1928    MRN: [id number]      Procedure Day /Date: 06/28/10 Thursday     Arrival Time: 2:00 pm     Procedure Time: 3:00 pm    Location of Procedure:                    _ x_  [facility] (4th Floor)  PREPARATION FOR COLONOSCOPY WITH MOVIPREP   Starting 5 days prior to your procedure 06/23/10 do not eat nuts, seeds, popcorn, corn, beans, peas,  salads, or any raw vegetables.  Do not take any fiber supplements (e.g. Metamucil, Citrucel, and Benefiber).  THE DAY BEFORE YOUR PROCEDURE         DATE: 06/27/10  DAY: Wednesday  1.  Drink clear liquids the entire day-NO SOLID FOOD  2.  Do not drink anything colored red or purple.  Avoid juices with pulp.  No orange juice.  3.  Drink at least 64 oz. (8 glasses) of fluid/clear liquids during the day to prevent dehydration and help the prep work efficiently.  CLEAR LIQUIDS INCLUDE: Water Jello Ice Popsicles Tea (sugar ok, no milk/cream) Powdered fruit flavored drinks Coffee (sugar ok, no milk/cream) Gatorade Juice: apple, white grape, white cranberry  Lemonade Clear bullion, consomm, broth Carbonated beverages (any kind) Strained chicken noodle soup Hard Candy                           4.  In the morning, mix first dose of MoviPrep solution:    Empty 1 Pouch A and 1 Pouch B into the disposable container    Add lukewarm drinking water to the top line of the container. Mix to dissolve    Refrigerate (mixed solution should be used within 24 hrs)  5.  Begin drinking the prep at 5:00 p.m. The MoviPrep container is divided by 4 marks.   Every 15 minutes drink the solution down to the next mark (approximately 8 oz) until the full liter is complete.   6.  Follow completed prep with 16 oz of clear liquid of your choice (Nothing red or purple).  Continue  to drink clear liquids until bedtime.  7.  Before going to bed, mix second dose of MoviPrep solution:    Empty 1 Pouch A and 1 Pouch B into the disposable container    Add lukewarm drinking water to the top line of the container. Mix to dissolve    Refrigerate  THE DAY OF YOUR PROCEDURE      DATE: 06/28/10 DAY: Thursday  Beginning at 10:00 a.m. (5 hours before procedure):         1. Every 15 minutes, drink the solution down to the next mark (approx 8 oz) until the full liter is complete.  2. Follow completed prep with 16 oz. of clear liquid of your choice.    3. You may drink clear liquids until 1:00 pm (2 HOURS BEFORE PROCEDURE).  MEDICATION INSTRUCTIONS  Unless otherwise instructed, you should take regular prescription medications with a small sip of water   as early as possible the morning of your procedure.  Stop taking Coumadin on  _ _  (5 days before procedure).  You will be contaced by our office prior to your procedure for  directions on holding your Coumadin/Warfarin.  If you do not hear from our office 1 week prior to your scheduled procedure, please call [phone] to discuss.       OTHER INSTRUCTIONS  You will need a responsible adult at least 75 years of age to accompany you and drive you home.   This person must remain in the waiting room during your procedure.  Wear loose fitting clothing that is easily removed.  Leave jewelry and other valuables at home.  However, you may wish to bring a book to read or  an iPod/MP3 player to listen to music as you wait for your procedure to start.  Remove all body piercing jewelry and leave at home.  Total time from sign-in until discharge is approximately 2-3 hours.  You should go home directly after your procedure and rest.  You can resume normal activities the  day after your procedure.  The day of your procedure you should not:   Drive   Make legal decisions   Operate machinery   Drink alcohol   Return to  work  You will receive specific instructions about eating, activities and medications before you leave.  The above instructions have been reviewed and explained to me by   _______________________  I fully understand and can verbalize these instructions _____________________________ Date 05/17/10

## 2010-09-13 NOTE — Procedures (Signed)
Summary: Colonoscopy  Patient: Justin Gallegos Note: All result statuses are Final unless otherwise noted.  Tests: (1) Colonoscopy (COL)   COL Colonoscopy           DONE     [facility]     [address]     [place], [place]  [address]           COLONOSCOPY PROCEDURE REPORT           PATIENT:  [name], [name]  MR#:  [id number]     BIRTHDATE:  [birth year], 81 yrs. old  GENDER:  male     ENDOSCOPIST:  Iva Boop, MD, [facility]           PROCEDURE DATE:  06/28/2010     PROCEDURE:  Colonoscopy with snare polypectomy     ASA CLASS:  Class III     INDICATIONS:  surveillance and high-risk screening, history of     pre-cancerous (adenomatous) colon polyps 1 cm tubulovillous     adenoma removed 8/08     MEDICATIONS:   Fentanyl 50 mcg IV, Versed 6 mg           DESCRIPTION OF PROCEDURE:   After the risks benefits and     alternatives of the procedure were thoroughly explained, informed     consent was obtained.  Digital rectal exam was performed and     revealed no abnormalities and normal prostate.   The LB 180AL     [id number] endoscope was introduced through the anus and advanced to     the cecum, which was identified by both the appendix and ileocecal     valve, without limitations.  The quality of the prep was     excellent, using MoviPrep.  The instrument was then slowly     withdrawn as the colon was fully examined. Insertion: 3:52 minutes     Withdrawal: 7:50 minutes     <<PROCEDUREIMAGES>>           FINDINGS:  Two polyps were found in the sigmoid colon. 5 mm and 7     mm polyps removed. Polyps were snared without cautery. Retrieval     was successful. Severe diverticulosis was found in the sigmoid     colon.  This was otherwise a normal examination of the colon.     Retroflexed views in the rectum revealed no abnormalities.    The     scope was then withdrawn from the patient and the procedure     completed.           COMPLICATIONS:  None     ENDOSCOPIC IMPRESSION:   1) Two polyps removed from the sigmoid colon, maximum size 7 mm           2) Severe diverticulosis in the sigmoid colon     3) Otherwise normal examination, excellent prep     4) Prior 1 cm tubulovillous adenoma removal 2008           RECOMMENDATIONS: resume warfarin tonight, have INR checked within     a week           REPEAT EXAM:  at his age and comorbidities, may be last routine     colonoscopy           Iva Boop, MD, [facility]           CC:  The Patient           n.  eSIGNED:   Iva Boop at 06/28/2010 03:36 PM           Edwyna Shell, [id number]  Note: An exclamation mark (!) indicates a result that was not dispersed into the flowsheet. Document Creation Date: 06/28/2010 3:36 PM _______________________________________________________________________  (1) Order result status: Final Collection or observation date-time: 06/28/2010 15:19 Requested date-time:  Receipt date-time:  Reported date-time:  Referring Physician:   Ordering Physician: Stan Head [phone]) Specimen Source:  Source: Launa Grill Order Number: [id number] Lab site:   Appended Document: Colonoscopy   Colonoscopy  Procedure date:  06/29/2010  Findings:          1) Two polyps removed from the sigmoid colon, maximum size 7 mm ADENOMA AND GRANULATION TISSUE           2) Severe diverticulosis in the sigmoid colon     3) Otherwise normal examination, excellent prep     4) Prior 1 cm tubulovillous adenoma removal 2008  Comments:      no routine repeat exams

## 2010-09-13 NOTE — Assessment & Plan Note (Signed)
Summary: PT/RCD   Nurse Visit   Allergies: 1)  ! Augmentin Laboratory Results   Blood Tests      INR: 2.3   (Normal Range: 0.88-1.12   Therap INR: 2.0-3.5) Comments: Justin Gallegos  August 21, 2010 2:13 PM     Orders Added: 1)  Est. Patient Level I [99211] 2)  Protime [13244WN]    ANTICOAGULATION RECORD PREVIOUS REGIMEN & LAB RESULTS Anticoagulation Diagnosis:  Atrial fibrillation,v58.61,v58.83 on  03/13/2007 Previous INR Goal Range:  2.5-3.5 on  03/13/2007 Previous INR:  1.7 on  08/14/2010 Previous Coumadin Dose(mg):  5 mg M-F, 2.5 mg Sat & Sun on  11/07/2009 Previous Regimen:  same on  08/14/2010 Previous Coagulation Comments:  Approved by Dr. Fabian Sharp on  08/14/2010  NEW REGIMEN & LAB RESULTS Current INR: 2.3 Regimen: 5mg . QD  Repeat testing in: 4 weeks  Anticoagulation Visit Questionnaire Coumadin dose missed/changed:  No Abnormal Bleeding Symptoms:  No  Any diet changes including alcohol intake, vegetables or greens since the last visit:  No Any illnesses or hospitalizations since the last visit:  No Any signs of clotting since the last visit (including chest discomfort, dizziness, shortness of breath, arm tingling, slurred speech, swelling or redness in leg):  No  MEDICATIONS COUMADIN 5 MG TABS (WARFARIN SODIUM) as directed TOPROL XL 25 MG TB24 (METOPROLOL SUCCINATE) take 1/2 once daily SIMVASTATIN 40 MG TABS (SIMVASTATIN) take 1 tab everyday FISH OIL 1000 MG CAPS (OMEGA-3 FATTY ACIDS) take 2 capsules daily FENOFIBRATE 160 MG TABS (FENOFIBRATE) 1 by mouth once daily FLONASE 50 MCG/ACT SUSP (FLUTICASONE PROPIONATE) 2 sprays in each nostril once daily

## 2010-09-13 NOTE — Assessment & Plan Note (Signed)
Summary: hosp fup//ccm   Vital Signs:  Patient profile:   77 year old male Weight:      194 pounds O2 Sat:      95 % Temp:     98 degrees F Pulse rate:   60 / minute BP sitting:   130 / 80  (left arm) Cuff size:   regular  Vitals Entered By: Pura Spice, RN (July 31, 2010 11:36 AM) CC: post hosp states had reaction to amoxicillin after taking 1 pill . adjust coumadin states had TIA   History of Present Illness: Here with his wife to follow up a hospital stay from 07-28-10 to 07-29-10 for an apparent TIA. We saw him on 07-27-10 for bronchitis symptoms, and started him on Augmentin. He took one dose that evening, then during the night he developed a severe ST with some swelling in the throat, so bad that he could not swallow. He was not SOB and he could speak clearly. They went to [facility], and by the time they got there he was having trouble finding his words to express himself, and he had weakness in the right arm and right leg. He was rushed to [facility] ER, and by the time he got there all these neurologic symptoms had completely disappeared. He had a normal head CT and a normal brain MRI with MRA. He was felt to have had a TIA. There was a question of whether he had an allergic reaction to the Augmentin, so it was stopped. He was switched to Levaquin, and has tolerated this well. His INR was high, so he was told to stop his Coumadin fir a few days. he has had no further neurologic symptoms at all. Today he feels very weak and has chest congestion. He is coughing up yellow sputum. No fever or SOB. The ST has resolved. He is taking food and liquids well.   Allergies (verified): 1)  ! Augmentin  Past History:  Past Medical History: Hypertension Hyperlipidemia Atrial fibrillation, sees Dr. Tenny Craw Osteoarthritis hemorrhoids Ureteral cancer Tubulovillous adenoma of colon 2008 Low back pain, sees Dr. Jeral Fruit sees Dr. Aldean Ast once a year for prostate exams TIA  07-28-10  Past Surgical History: Reviewed history from 05/17/2010 and no changes required. Appendectomy Tonsillectomy Repair of bilateral inguinal herinas Nephrectomy left and ureterectomy 04/20/09 per Dr. Heloise Purpura  Review of Systems  The patient denies anorexia, fever, weight loss, weight gain, vision loss, decreased hearing, hoarseness, chest pain, syncope, dyspnea on exertion, peripheral edema, headaches, hemoptysis, abdominal pain, melena, hematochezia, severe indigestion/heartburn, hematuria, incontinence, genital sores, muscle weakness, suspicious skin lesions, transient blindness, difficulty walking, depression, unusual weight change, abnormal bleeding, enlarged lymph nodes, angioedema, breast masses, and testicular masses.    Physical Exam  General:  alert but weak, walks with a cane Head:  Normocephalic and atraumatic without obvious abnormalities. No apparent alopecia or balding. Eyes:  No corneal or conjunctival inflammation noted. EOMI. Perrla. Funduscopic exam benign, without hemorrhages, exudates or papilledema. Vision grossly normal. Ears:  External ear exam shows no significant lesions or deformities.  Otoscopic examination reveals clear canals, tympanic membranes are intact bilaterally without bulging, retraction, inflammation or discharge. Hearing is grossly normal bilaterally. Nose:  External nasal examination shows no deformity or inflammation. Nasal mucosa are pink and moist without lesions or exudates. Neck:  No deformities, masses, or tenderness noted. Lungs:  scattered rhonchi, no rales  Heart:  normal rate, no murmur, no gallop, no rub, no JVD, no HJR, and irregular rhythm.  Neurologic:  alert & oriented X3, cranial nerves II-XII intact, and sensation intact to light touch.     Impression & Recommendations:  Problem # 1:  ACUTE BRONCHITIS (ICD-466.0)  The following medications were removed from the medication list:    Augmentin 875-125 Mg Tabs  (Amoxicillin-pot clavulanate) .[name]..[name] Two times a day His updated medication list for this problem includes:    Levaquin 500 Mg Tabs (Levofloxacin) ..... Once daily  Problem # 2:  ATRIAL FIBRILLATION (ICD-427.31)  His updated medication list for this problem includes:    Coumadin 5 Mg Tabs (Warfarin sodium) .[name]... As directed    Toprol Xl 25 Mg Tb24 (Metoprolol succinate) .[name]... Take 1/2 once daily  Problem # 3:  COUMADIN THERAPY (ICD-V58.61)  Orders: Protime ([address]UE) Fingerstick (45409)  Problem # 4:  TRANSIENT ISCHEMIC ATTACK (ICD-435.9)  His updated medication list for this problem includes:    Coumadin 5 Mg Tabs (Warfarin sodium) .[name]... As directed  Complete Medication List: 1)  Coumadin 5 Mg Tabs (Warfarin sodium) .... As directed 2)  Toprol Xl 25 Mg Tb24 (Metoprolol succinate) .... Take 1/2 once daily 3)  Simvastatin 40 Mg Tabs (Simvastatin) .... Take 1 tab everyday 4)  Fish Oil 1000 Mg Caps (Omega-3 fatty acids) .... Take 2 capsules daily 5)  Fenofibrate 160 Mg Tabs (Fenofibrate) .[name].. 1 by mouth once daily 6)  Flonase 50 Mcg/act Susp (Fluticasone propionate) .... 2 sprays in each nostril once daily 7)  Levaquin 500 Mg Tabs (Levofloxacin) .... Once daily  Patient Instructions: 1)  He will finish out a 10 day course of Levaquin and rest. Start back on 7.5 mg a day of Coumadin, and see me again in one week.  Prescriptions: LEVAQUIN 500 MG TABS (LEVOFLOXACIN) once daily  #5 x 0   Entered and Authorized by:   Nelwyn Salisbury MD   Signed by:   Nelwyn Salisbury MD on 07/31/2010   Method used:   Electronically to        [facility] [phone]* (retail)       [address] [place], [place]  [address]       Ph: [phone]       Fax: [phone]   RxID:   [id number]    Orders Added: 1)  Protime [85610QW] 2)  Fingerstick [64403] 3)  Est. Patient Level IV [47425]

## 2010-09-13 NOTE — Consult Note (Signed)
Summary: [facility]   Imported By: Maryln Gottron 11/17/2009 14:43:39  _____________________________________________________________________  External Attachment:    Type:   Image     Comment:   External Document

## 2010-09-13 NOTE — Assessment & Plan Note (Signed)
Summary: PT//CCM   Nurse Visit   Allergies: No Known Drug Allergies Laboratory Results   Blood Tests      INR: 2.4   (Normal Range: 0.88-1.12   Therap INR: 2.0-3.5) Comments: [name]  July 10, 2010 2:34 PM     Orders Added: 1)  Est. Patient Level I [99211] 2)  Protime [[address]UE]   ANTICOAGULATION RECORD PREVIOUS REGIMEN & LAB RESULTS Anticoagulation Diagnosis:  Atrial fibrillation,v58.61,v58.83 on  03/13/2007 Previous INR Goal Range:  2.5-3.5 on  03/13/2007 Previous INR:  2.7 on  06/05/2010 Previous Coumadin Dose(mg):  5 mg M-F, 2.5 mg Sat & Sun on  11/07/2009 Previous Regimen:  same on  06/05/2010  NEW REGIMEN & LAB RESULTS Current INR: 2.4 Regimen: same  Repeat testing in: 4 weeks  Anticoagulation Visit Questionnaire Coumadin dose missed/changed:  No Abnormal Bleeding Symptoms:  No  Any diet changes including alcohol intake, vegetables or greens since the last visit:  No Any illnesses or hospitalizations since the last visit:  No Any signs of clotting since the last visit (including chest discomfort, dizziness, shortness of breath, arm tingling, slurred speech, swelling or redness in leg):  No  MEDICATIONS COUMADIN 5 MG TABS (WARFARIN SODIUM) as directed TOPROL XL 25 MG TB24 (METOPROLOL SUCCINATE) take 1/2 once daily VYTORIN 10-80 MG TABS (EZETIMIBE-SIMVASTATIN) 1/2 tablet once daily FISH OIL 1000 MG CAPS (OMEGA-3 FATTY ACIDS) take 2 capsules daily FENOFIBRATE 160 MG TABS (FENOFIBRATE) 1 by mouth once daily FLONASE 50 MCG/ACT SUSP (FLUTICASONE PROPIONATE) 2 sprays in each nostril once daily

## 2010-09-13 NOTE — Letter (Signed)
Summary: [facility]  [facility]   Imported By: Maryln Gottron 09/13/2009 10:32:16  _____________________________________________________________________  External Attachment:    Type:   Image     Comment:   External Document

## 2010-09-13 NOTE — Assessment & Plan Note (Signed)
Summary: allergies/dm   Vital Signs:  Patient profile:   77 year old male Weight:      192 pounds BMI:     27.26 O2 Sat:      97 % on Room air Temp:     98.1 degrees F oral Pulse rate:   84 / minute Pulse rhythm:   irregular BP sitting:   90 / 70  (left arm) Cuff size:   regular  Vitals Entered By: Raechel Ache, RN (December 08, 2009 2:13 PM)  O2 Flow:  Room air CC: C/o sick x 1 week with congestion, sneezing- yesterday got worse with productive cough.   History of Present Illness: Here for one week of PND, chest congestion, mild SOB, and coughin up yellow sputum. No fever.   Allergies (verified): No Known Drug Allergies  Past History:  Past Medical History: Reviewed history from 03/02/2009 and no changes required. Hypertension Hyperlipidemia Atrial fibrillation, sees Dr. Tenny Craw Osteoarthritis hemorrhoids Low back pain, sees Dr. Jeral Fruit sees Dr. Aldean Ast once a year for prostate exams  Past Surgical History: Reviewed history from 04/28/2009 and no changes required. Appendectomy Tonsillectomy Repair of bilateral inguinal herinas colonoscopy 03-13-07 per Dr. Leone Payor, repeat in 3 years Nephrectomy left and ureterectomy 04/20/09 per Dr. Heloise Purpura  Review of Systems  The patient denies anorexia, fever, weight loss, weight gain, vision loss, decreased hearing, hoarseness, chest pain, syncope, peripheral edema, headaches, hemoptysis, abdominal pain, melena, hematochezia, severe indigestion/heartburn, hematuria, incontinence, genital sores, muscle weakness, suspicious skin lesions, transient blindness, difficulty walking, depression, unusual weight change, abnormal bleeding, enlarged lymph nodes, angioedema, breast masses, and testicular masses.    Physical Exam  General:  Well-developed,well-nourished,in no acute distress; alert,appropriate and cooperative throughout examination Head:  Normocephalic and atraumatic without obvious abnormalities. No apparent alopecia or  balding. Eyes:  No corneal or conjunctival inflammation noted. EOMI. Perrla. Funduscopic exam benign, without hemorrhages, exudates or papilledema. Vision grossly normal. Ears:  External ear exam shows no significant lesions or deformities.  Otoscopic examination reveals clear canals, tympanic membranes are intact bilaterally without bulging, retraction, inflammation or discharge. Hearing is grossly normal bilaterally. Nose:  External nasal examination shows no deformity or inflammation. Nasal mucosa are pink and moist without lesions or exudates. Mouth:  Oral mucosa and oropharynx without lesions or exudates.  Teeth in good repair. Neck:  No deformities, masses, or tenderness noted. Lungs:  Normal respiratory effort, chest expands symmetrically. Lungs are clear to auscultation, no crackles or wheezes. Heart:  normal rate, no murmur, no gallop, no rub, no JVD, and irregular rhythm.     Impression & Recommendations:  Problem # 1:  BRONCHITIS, ACUTE (ICD-466.0) Assessment New  His updated medication list for this problem includes:    Zithromax Z-pak 250 Mg Tabs (Azithromycin) .[name]... As directed  Problem # 2:  ALLERGIC RHINITIS (ICD-477.9) Assessment: Unchanged  Problem # 3:  HYPERTENSION (ICD-401.9) Assessment: Unchanged  His updated medication list for this problem includes:    Toprol Xl 25 Mg Tb24 (Metoprolol succinate) .[name]... Take 1/2 once daily  Problem # 4:  ATRIAL FIBRILLATION (ICD-427.31) Assessment: Unchanged  His updated medication list for this problem includes:    Coumadin 5 Mg Tabs (Warfarin sodium) .[name]... As directed    Toprol Xl 25 Mg Tb24 (Metoprolol succinate) .[name]... Take 1/2 once daily  Complete Medication List: 1)  Coumadin 5 Mg Tabs (Warfarin sodium) .... As directed 2)  Toprol Xl 25 Mg Tb24 (Metoprolol succinate) .... Take 1/2 once daily 3)  Vytorin 10-80 Mg Tabs (  Ezetimibe-simvastatin) .... 1/2 tablet once daily 4)  Multivitamins Tabs (Multiple vitamin) .[name].. 1 by  mouth once daily 5)  Fish Oil 1000 Mg Caps (Omega-3 fatty acids) .... Take 2 capsules daily 6)  Fenofibrate 160 Mg Tabs (Fenofibrate) .[name].. 1 by mouth once daily 7)  Zithromax Z-pak 250 Mg Tabs (Azithromycin) .... As directed  Patient Instructions: 1)  Please schedule a follow-up appointment as needed .  Prescriptions: ZITHROMAX Z-PAK 250 MG TABS (AZITHROMYCIN) as directed  #1 x 0   Entered and Authorized by:   Nelwyn Salisbury MD   Signed by:   Nelwyn Salisbury MD on 12/08/2009   Method used:   Electronically to        [facility] [phone]* (retail)       [address] [place], [place]  [address]       Ph: [phone]       Fax: [phone]   RxID:   [id number]

## 2010-09-13 NOTE — Assessment & Plan Note (Signed)
Summary: pt/njr   Nurse Visit   Allergies: No Known Drug Allergies Laboratory Results   Blood Tests   Date/Time Received: November 07, 2009 2:38 PM Date/Time Reported: November 07, 2009 2:38 PM  PT: 16.7 s   (Normal Range: 10.6-13.4)  INR: 1.8   (Normal Range: 0.88-1.12   Therap INR: 2.0-3.5) Comments: [name]  November 07, 2009 2:38 PM    Orders Added: 1)  Est. Patient Level I [99211] 2)  Protime [04540JW]    ANTICOAGULATION RECORD PREVIOUS REGIMEN & LAB RESULTS Anticoagulation Diagnosis:  Atrial fibrillation,v58.61,v58.83 on  03/13/2007 Previous INR Goal Range:  2.5-3.5 on  03/13/2007 Previous INR:  1.1 on  10/23/2009 Previous Coumadin Dose(mg):  5 mg qd on  02/16/2008 Previous Regimen:  2.5mg .tues, thurs sat others 5mg . on  10/23/2009  NEW REGIMEN & LAB RESULTS Current INR: 1.8 Current Coumadin Dose(mg): 5 mg M-F, 2.5 mg Sat & Sun Regimen: 2.5mg .tues, thurs sat others 5mg .  (no change)  Repeat testing in: 2 wks  Anticoagulation Visit Questionnaire Coumadin dose missed/changed:  No Abnormal Bleeding Symptoms:  No  Any diet changes including alcohol intake, vegetables or greens since the last visit:  No Any illnesses or hospitalizations since the last visit:  No Any signs of clotting since the last visit (including chest discomfort, dizziness, shortness of breath, arm tingling, slurred speech, swelling or redness in leg):  No  MEDICATIONS COUMADIN 5 MG TABS (WARFARIN SODIUM) as directed TOPROL XL 25 MG TB24 (METOPROLOL SUCCINATE) take 1/2 once daily VYTORIN 10-80 MG TABS (EZETIMIBE-SIMVASTATIN) 1/2 tablet once daily MULTIVITAMINS   TABS (MULTIPLE VITAMIN) 1 by mouth once daily FISH OIL 1000 MG CAPS (OMEGA-3 FATTY ACIDS) take 2 capsules daily FENOFIBRATE 160 MG TABS (FENOFIBRATE) 1 by mouth once daily

## 2010-09-25 ENCOUNTER — Other Ambulatory Visit: Payer: Self-pay

## 2010-09-25 ENCOUNTER — Ambulatory Visit (INDEPENDENT_AMBULATORY_CARE_PROVIDER_SITE_OTHER): Payer: Medicare Other | Admitting: Family Medicine

## 2010-09-25 DIAGNOSIS — I4891 Unspecified atrial fibrillation: Secondary | ICD-10-CM

## 2010-10-22 LAB — COMPREHENSIVE METABOLIC PANEL
ALT: 17 U/L (ref 0–53)
ALT: 19 U/L (ref 0–53)
AST: 24 U/L (ref 0–37)
Albumin: 2.9 g/dL — ABNORMAL LOW (ref 3.5–5.2)
Alkaline Phosphatase: 31 U/L — ABNORMAL LOW (ref 39–117)
Alkaline Phosphatase: 39 U/L (ref 39–117)
BUN: 26 mg/dL — ABNORMAL HIGH (ref 6–23)
CO2: 22 mEq/L (ref 19–32)
Calcium: 8.3 mg/dL — ABNORMAL LOW (ref 8.4–10.5)
Chloride: 105 mEq/L (ref 96–112)
GFR calc Af Amer: 35 mL/min — ABNORMAL LOW (ref >60)
GFR calc non Af Amer: 29 mL/min — ABNORMAL LOW (ref >60)
Glucose, Bld: 109 mg/dL — ABNORMAL HIGH (ref 70–99)
Potassium: 4 mEq/L (ref 3.5–5.1)
Sodium: 133 mEq/L — ABNORMAL LOW (ref 135–145)
Sodium: 136 mEq/L (ref 135–145)
Total Bilirubin: 1 mg/dL (ref 0.3–1.2)
Total Protein: 5.7 g/dL — ABNORMAL LOW (ref 6.0–8.3)

## 2010-10-22 LAB — URINALYSIS, ROUTINE W REFLEX MICROSCOPIC
Bilirubin Urine: NEGATIVE
Bilirubin Urine: NEGATIVE
Ketones, ur: 15 mg/dL — AB
Specific Gravity, Urine: 1.029 (ref 1.005–1.030)
Specific Gravity, Urine: 1.022 (ref 1.005–1.030)
pH: 6 (ref 5.0–8.0)
pH: 6 (ref 5.0–8.0)

## 2010-10-22 LAB — DIFFERENTIAL
Basophils Absolute: 0 10*3/uL (ref 0.0–0.1)
Basophils Relative: 1 % (ref 0–1)
Eosinophils Absolute: 0 10*3/uL (ref 0.0–0.7)
Eosinophils Absolute: 0.1 10*3/uL (ref 0.0–0.7)
Eosinophils Relative: 0 % (ref 0–5)
Lymphocytes Relative: 20 % (ref 12–46)
Monocytes Absolute: 1.2 10*3/uL — ABNORMAL HIGH (ref 0.1–1.0)
Neutrophils Relative %: 81 % — ABNORMAL HIGH (ref 43–77)

## 2010-10-22 LAB — URINE MICROSCOPIC-ADD ON

## 2010-10-22 LAB — BASIC METABOLIC PANEL
CO2: 21 mEq/L (ref 19–32)
Calcium: 8.7 mg/dL (ref 8.4–10.5)
Creatinine, Ser: 1.9 mg/dL — ABNORMAL HIGH (ref 0.4–1.5)
GFR calc Af Amer: 42 mL/min — ABNORMAL LOW (ref >60)
Glucose, Bld: 114 mg/dL — ABNORMAL HIGH (ref 70–99)

## 2010-10-22 LAB — CBC
HCT: 43.6 % (ref 39.0–52.0)
Hemoglobin: 13.7 g/dL (ref 13.0–17.0)
Hemoglobin: 15.3 g/dL (ref 13.0–17.0)
MCH: 31 pg (ref 26.0–34.0)
MCHC: 35.1 g/dL (ref 30.0–36.0)
MCHC: 36 g/dL (ref 30.0–36.0)
MCV: 90.6 fL (ref 78.0–100.0)
Platelets: 137 10*3/uL — ABNORMAL LOW (ref 150–400)
Platelets: 142 10*3/uL — ABNORMAL LOW (ref 150–400)
RBC: 4.32 MIL/uL (ref 4.22–5.81)
RBC: 5.23 MIL/uL (ref 4.22–5.81)
RDW: 13.8 % (ref 11.5–15.5)
WBC: 8.2 10*3/uL (ref 4.0–10.5)

## 2010-10-22 LAB — URINE CULTURE: Colony Count: NO GROWTH

## 2010-10-22 LAB — PROTIME-INR
INR: 3.54 — ABNORMAL HIGH (ref 0.00–1.49)
INR: 2.89 — ABNORMAL HIGH (ref 0.00–1.49)
Prothrombin Time: 30.3 seconds — ABNORMAL HIGH (ref 11.6–15.2)

## 2010-10-22 LAB — HEMOGLOBIN A1C
Hgb A1c MFr Bld: 5.9 % — ABNORMAL HIGH (ref <5.7)
Mean Plasma Glucose: 123 mg/dL — ABNORMAL HIGH (ref <117)

## 2010-10-22 LAB — MAGNESIUM: Magnesium: 1.8 mg/dL (ref 1.5–2.5)

## 2010-10-22 LAB — PHOSPHORUS: Phosphorus: 2.6 mg/dL (ref 2.3–4.6)

## 2010-10-22 LAB — CARDIAC PANEL(CRET KIN+CKTOT+MB+TROPI)
Relative Index: 0.8 (ref 0.0–2.5)
Troponin I: 0.01 ng/mL (ref 0.00–0.06)

## 2010-10-22 LAB — LIPID PANEL: HDL: 37 mg/dL — ABNORMAL LOW (ref >39)

## 2010-10-23 ENCOUNTER — Ambulatory Visit (INDEPENDENT_AMBULATORY_CARE_PROVIDER_SITE_OTHER): Payer: Medicare Other | Admitting: Family Medicine

## 2010-10-23 DIAGNOSIS — I4891 Unspecified atrial fibrillation: Secondary | ICD-10-CM

## 2010-10-23 LAB — POCT INR: INR: 3.1

## 2010-10-27 ENCOUNTER — Other Ambulatory Visit: Payer: Self-pay | Admitting: Family Medicine

## 2010-11-13 ENCOUNTER — Other Ambulatory Visit: Payer: Self-pay | Admitting: Family Medicine

## 2010-11-13 NOTE — Telephone Encounter (Signed)
z pk denied by dr fry and this was faxed to [facility].

## 2010-11-16 LAB — HEMOGLOBIN AND HEMATOCRIT, BLOOD
HCT: 42.9 % (ref 39.0–52.0)
Hemoglobin: 13.9 g/dL (ref 13.0–17.0)

## 2010-11-16 LAB — BASIC METABOLIC PANEL
BUN: 13 mg/dL (ref 6–23)
BUN: 18 mg/dL (ref 6–23)
BUN: 18 mg/dL (ref 6–23)
CO2: 22 mEq/L (ref 19–32)
CO2: 23 mEq/L (ref 19–32)
CO2: 25 mEq/L (ref 19–32)
Calcium: 7.4 mg/dL — ABNORMAL LOW (ref 8.4–10.5)
Chloride: 104 mEq/L (ref 96–112)
Chloride: 104 mEq/L (ref 96–112)
Chloride: 107 mEq/L (ref 96–112)
Chloride: 111 mEq/L (ref 96–112)
Creatinine, Ser: 1.44 mg/dL (ref 0.4–1.5)
Creatinine, Ser: 1.76 mg/dL — ABNORMAL HIGH (ref 0.4–1.5)
Creatinine, Ser: 1.85 mg/dL — ABNORMAL HIGH (ref 0.4–1.5)
GFR calc Af Amer: 43 mL/min — ABNORMAL LOW (ref >60)
GFR calc Af Amer: 46 mL/min — ABNORMAL LOW (ref >60)
Glucose, Bld: 148 mg/dL — ABNORMAL HIGH (ref 70–99)
Glucose, Bld: 216 mg/dL — ABNORMAL HIGH (ref 70–99)
Potassium: 3.6 mEq/L (ref 3.5–5.1)
Potassium: 4.6 mEq/L (ref 3.5–5.1)
Sodium: 139 mEq/L (ref 135–145)

## 2010-11-16 LAB — PROTIME-INR
INR: 1.1 (ref 0.00–1.49)
INR: 1.1 (ref 0.00–1.49)
Prothrombin Time: 13.6 seconds (ref 11.6–15.2)
Prothrombin Time: 14.5 seconds (ref 11.6–15.2)
Prothrombin Time: 16.6 seconds — ABNORMAL HIGH (ref 11.6–15.2)

## 2010-11-16 LAB — TYPE AND SCREEN
ABO/RH(D): O NEG
Antibody Screen: NEGATIVE

## 2010-11-16 LAB — CREATININE, FLUID (PLEURAL, PERITONEAL, JP DRAINAGE)

## 2010-11-16 LAB — APTT: aPTT: 29 seconds (ref 24–37)

## 2010-11-17 LAB — PROTIME-INR
INR: 1 (ref 0.00–1.49)
Prothrombin Time: 13.5 seconds (ref 11.6–15.2)

## 2010-11-17 LAB — URINALYSIS, MICROSCOPIC ONLY
Bilirubin Urine: NEGATIVE
Ketones, ur: NEGATIVE mg/dL
Nitrite: NEGATIVE
Protein, ur: NEGATIVE mg/dL
Urobilinogen, UA: 0.2 mg/dL (ref 0.0–1.0)

## 2010-11-17 LAB — POCT I-STAT 4, (NA,K, GLUC, HGB,HCT)
Glucose, Bld: 103 mg/dL — ABNORMAL HIGH (ref 70–99)
HCT: 51 % (ref 39.0–52.0)

## 2010-11-27 ENCOUNTER — Ambulatory Visit (INDEPENDENT_AMBULATORY_CARE_PROVIDER_SITE_OTHER): Payer: Medicare Other | Admitting: Family Medicine

## 2010-11-27 DIAGNOSIS — I4891 Unspecified atrial fibrillation: Secondary | ICD-10-CM

## 2010-11-27 NOTE — Patient Instructions (Signed)
Same dose 

## 2010-12-25 NOTE — Op Note (Signed)
NAME:  Justin Gallegos, Justin Gallegos              ACCOUNT NO.:  [id number]   MEDICAL RECORD NO.:  [id number]          PATIENT TYPE:  AMB   LOCATION:  [facility]                         FACILITY:  [facility]   PHYSICIAN:  Courtney Paris, M.D.DATE OF BIRTH:  August 28, 1928   DATE OF PROCEDURE:  03/14/2009  DATE OF DISCHARGE:                               OPERATIVE REPORT   PREOPERATIVE DIAGNOSES:  1. Left ureteral carcinoma.  2. Hematuria.   POSTOPERATIVE DIAGNOSES:  1. Left ureteral carcinoma.  2. Hematuria.   OPERATION:  1. Cystoscopy.  2. Left retrograde pyelogram.  3. Left ureteroscopy.   ANESTHESIA:  General.   SURGEON:  Courtney Paris, M.D.   BRIEF HISTORY:  This 77 year old patient had total painless hematuria  recently with a negative cysto.  He is on warfarin for atrial  fibrillation.  CT scan done on February 18, 2009, showed a filling defect in  the left distal ureter and he enters now for ureteroscopy, possible  holmium laser treatment.  He stopped his warfarin on March 10, 2009, and  his INR was normal today.   DESCRIPTION OF PROCEDURE:  The patient was placed on the operating table  in the dorsal lithotomy position.  After satisfactory induction of  general endotracheal anesthesia, he was prepped and draped with Betadine  and given IV Cipro.  A timeout was then performed and the patient and  procedure then reconfirmed.  Using the 21 panendoscope the anterior  urethra was inspected.  No anterior lesions were seen.  The posterior  urethra showed mild prostate enlargement in a trilobar fashion.  The  bladder was entered.  The bladder was free of any mucosal lesions as it  was before.  The left orifice was normal as was the right.  A 5 open-  ended ureteral catheter was then passed inside the left ureteral orifice  and an occlusive retrograde was done.   This showed a large filling defect just below the pelvic brim on the  left side.  It looked like the filling defect was probably 2  cm or so in  length.  There were no other filling defects in the kidney or in the  upper ureter.   A Sensor guidewire was then passed under fluoroscopy past the tumor up  into the level of the kidney and then the cystoscope then removed.  Using the inner cannula of an ureteral access sheath the distal left  ureter was gently dilated under fluoroscopy.  After several minutes of  this, the dilator was then removed leaving the guidewire in place.  The  6 short ureteroscope was then passed under direct vision into the left  orifice and up to the tumor which was photographed.  An x-ray was made  of the scope at this position as well.  There was clearly a large tumor  present and much too big to try to laser today.  The scope was then  removed, the bladder drained and the guidewire removed.  No stent was  left.  The patient was then taken to the recovery room in good condition  having been given a  B and O suppository.  He will be later discharged as an outpatient and  will come back to discuss further options.  If he is to have a segmental  resection, he would have to have a Boari flap extended to reach the  pelvic brim, but I am not sure there is enough length to do that.  He  may need a nephroureterectomy.      Courtney Paris, M.D.  Electronically Signed     HMK/MEDQ  D:  03/14/2009  T:  03/14/2009  Job:  [id number]

## 2010-12-25 NOTE — Assessment & Plan Note (Signed)
[facility]                            CARDIOLOGY OFFICE NOTE   Justin Gallegos, Justin Gallegos                     MRN:          [id number]  DATE:08/11/2008                            DOB:          03-Dec-1928    IDENTIFICATION:  Justin Gallegos is a 77 year old gentleman who I last saw him  back in January.  He has a history of atrial fibrillation and  dyslipidemia.   In the interval, he has no complaints.  No chest pain.  No palpitations.  No shortness of breath.   CURRENT MEDICINES:  1. Vytorin 5/20.  2. Toprol-XL 12.5 daily.  3. Coumadin as directed.  4. Multivitamin.  5. Fish oil 1.2 g daily.   PHYSICAL EXAMINATION:  GENERAL:  The patient is in no distress.  VITAL SIGNS:  Blood pressure 123/82, pulse is 98 and irregular, and  weight is 188.  NECK:  No JVD.  LUNGS:  Clear.  No rales.  CARDIAC:  Irregularly irregular.  S1 and S2.  No S3.  No murmurs.  ABDOMEN:  Benign.  EXTREMITIES:  No edema.   A 12-lead EKG shows atrial fibrillation at 98 beats per minute.  Nonspecific T-wave changes.  Possible inferior wall MI.  Note, present  on previous EKG(remote).   Labs from September, total cholesterol of 171, LDL of 112, triglycerides  of 230, and HDL of 37.   IMPRESSION:  1. Atrial fibrillation, rate control.  He has had a Holter in the past      in 2008, average heart rate was 86.  No pauses.  I would keep him      on the same regimen.  I am not convinced there has been a      significant change.  2. Dyslipidemia.  I have reviewed his lipids, seems like there is a      lot of dietary issues.  I talked this over with him.  He is      frustrated because with Coumadin, he feels he cannot eat much      grains and he wonders what else he can eat.  I would keep him on      the Vytorin for now.  Actually, it would be good if he could go up      to 10/40, but I would keep him on 05/20.  I have given him some      samples again concern for cost.  I will be back  in touch with him.      Note, a hemoglobin A1c is 5.5.  His triglycerides have been up and      down in the past.  He does admit to eating some sweets.  3. EKG, again not convinced there has been any event.  He has had      small R-waves in lead aVF and apparent Q-waves in the past.  I      would follow clinically.   Otherwise, I will set to see in 1 year's time.     Pricilla Riffle, MD, [facility]  Electronically Signed    PVR/MedQ  DD:  08/14/2008  DT: 08/14/2008  Job #: [id number]   cc:   Jeannett Senior A. Clent Ridges, MD

## 2010-12-25 NOTE — Assessment & Plan Note (Signed)
[facility]                            CARDIOLOGY OFFICE NOTE   Justin Gallegos, Justin Gallegos                     MRN:          [id number]  DATE:09/10/2007                            DOB:          1928/10/29    IDENTIFICATION:  Justin Gallegos is a 77 year old gentleman who was last seen  in April of last year.  He has a history of atrial fibrillation,  dyslipidemia.   Since seen, he notes one day of dizziness about 10 weeks ago.  He stayed  in bed.  The room was just spinning.  He has not had any recurrence.  His breathing is good.  He denies chest pain.  No palpitations.  No  dizziness now.   CURRENT MEDICATIONS:  1. Vytorin 5/20.  2. Toprol XL 12.5 daily.  3. Coumadin as directed.  4. Multivitamin.  5. Fish oil up 1200 mg daily.   PHYSICAL EXAMINATION:  GENERAL:  The patient is in no distress.  His  blood pressure is 122/83, pulse is 78.  LUNGS:  Clear.  CARDIAC:  Irregularly irregular, S1, S2, no S3.  No murmurs.  ABDOMEN:  Exam is benign.  EXTREMITIES:  No edema.   STUDIES:  A 12-lead EKG 84 beats per minute.   IMPRESSION:  1. Atrial fibrillation.  Tolerating well.  Last Holter monitor showed      good rate control.  He did have a four beat automatic ventricular      rhythm at 75 beats per minute.  I would keep him on the same      regimen for now, would not change.  2. Dyslipidemia.  Will set up for fasting lipid.  3. Encouraged him to stay active.  I will set to see him back in about      a year's time.     Pricilla Riffle, MD, [facility]  Electronically Signed    PVR/MedQ  DD: 09/10/2007  DT: 09/11/2007  Job #: [id number]   cc:   Justin Senior A. Clent Ridges, MD

## 2010-12-25 NOTE — Assessment & Plan Note (Signed)
[facility] [place] [facility]                         GASTROENTEROLOGY OFFICE NOTE   VINEETH, FELL                     MRN:          [id number]  DATE:02/09/2007                            DOB:          1928-09-08    REASON FOR CONSULTATION:  Cancer screening. The patient takes Coumadin  for atrial fibrillation.   ASSESSMENT:  A 77 year old white man with a history of hemorrhoids, but  no other GI symptoms. He has never had a screening colonoscopy. He is on  warfarin for atrial fibrillation, followed by Dr.  Tenny Craw.   PLAN:  It is reasonable to pursue a screening colonoscopy in this man.  His Coumadin needs to be held for several days prior, usually 5. Will  need to clear this with Dr.  Tenny Craw. Will go ahead and schedule a  colonoscopy for several weeks down the road and in anticipation of this  clearance, risks, benefits and indications of the procedure including  the possible, but rare risk of stroke off of Coumadin.   HISTORY:  As above. See Medical History and Physical form for full  details. He has had some swollen hemorrhoids, but no other symptoms at  this time. No bleeding and no bowel movement changes. No abdominal pain.  There is no family history of colon cancer.   PAST MEDICAL HISTORY:  1. Atrial fibrillation.  2. Dyslipidemia.  3. Prior hernia repair.  4. Appendectomy.   CURRENT MEDICATIONS:  1. Warfarin as directed.  2. Multivitamin daily.  3. Fish oil 1200 mg daily.  4. Toprol XL 12.5 mg daily.  5. Vytorin 5/20 mg daily.   ALLERGIES:  No known drug allergies.   SOCIAL HISTORY, FAMILY HISTORY, REVIEW OF SYSTEMS:  See Medical History  Form for full details. He is retired Holiday representative at [employer]. No alcohol, tobacco or drugs. Lives in [place].   PHYSICAL EXAMINATION:  Reveals a well-developed, well-nourished elderly  white man looking younger than stated age.  VITAL SIGNS: Show weight 190 pounds. Blood pressure 122/82,  pulse 84 and  irregular. Respirations 18.  EYES: Anicteric.  NECK: Is supple without mass, thyromegaly detected.  CHEST: Is clear.  HEART: S1, S2. Irregular rhythm, normal rate.  ABDOMEN: Is soft, nontender without organomegaly or mass.  He is alert and oriented x3.   I appreciate the opportunity to care for this patient.     Iva Boop, MD,FACG  Electronically Signed    CEG/MedQ  DD: 02/09/2007  DT: 02/09/2007  Job #: [id number]   cc:   Tera Mater. Clent Ridges, MD  Pricilla Riffle, MD, [facility]

## 2010-12-28 NOTE — Assessment & Plan Note (Signed)
[facility]                            CARDIOLOGY OFFICE NOTE   SANTE, BIEDERMANN                     MRN:          [id number]  DATE:11/27/2006                            DOB:          August 27, 1928    IDENTIFICATION:  Patient is a 77 year old.  I last saw him back in  August of last year.  He has a history of atrial fibrillation and  dyslipidemia.   Since seen, he has done pretty well.  He is active.  Denies any  significant shortness of breath.  No chest pain.   CURRENT MEDICATIONS:  Include:  1. Coumadin as directed.  2. Multiple vitamin daily.  3. Fish oil daily.  4. Toprol XL 12.5 daily.  5. Vytorin 1/2 of a 10/40 daily.   PHYSICAL EXAMINATION:  On exam, the patient is in no distress.  Blood pressure is 130/74.  Pulse is 80 and irregular.  Weight 190.  LUNGS:  Clear.  CARDIAC EXAM:  Regular rate and rhythm.  S1 and S2.  No S3.  ABDOMEN:  Benign.  EXTREMITIES:  No edema.   IMPRESSION:  1. Atrial fibrillation.  We will go ahead and set up for a Holter      monitor to see what his 24-hour heart rate control is.  Continue      medications for now.  2. Dyslipidemia.  We will check fasting lipid panel today with AST.      Again, this is primary prevention.   Otherwise, I will set followup for 10 months.   ADDENDUM  12-lead EKG shows atrial fibrillation at 89 beats per minute.     Pricilla Riffle, MD, [facility]  Electronically Signed    PVR/MedQ  DD: 11/27/2006  DT: 11/27/2006  Job #: [id number]   cc:   Jeannett Senior A. Clent Ridges, MD

## 2010-12-28 NOTE — Assessment & Plan Note (Signed)
[facility]                              CARDIOLOGY OFFICE NOTE   Justin Gallegos, Justin Gallegos                     MRN:          [id number]  DATE:03/17/2006                            DOB:          1928-09-14    IDENTIFICATION:  Justin Gallegos is a 77 year old gentleman.  I last saw him back  in December of last year.  He has a history of intermittent atrial  fibrillation and dyslipidemia.   Since seen, he has done well.  He denies shortness of breath.  He is active.  He notes no chest pain.  He denies palpitations.   CURRENT MEDICATIONS:  1.  Multivitamin daily.  2.  Fish oil.  3.  Vytorin 10/40 daily.  4.  Toprol-XL 12.5 daily.   REVIEW OF SYSTEMS:  Has noted some erectile dysfunction.  This has been a  chronic problem.   PHYSICAL EXAMINATION:  GENERAL:  The patient is in no distress  VITAL SIGNS:  Blood pressure 120/83, pulse 82 and irregular.  Weight 183,  down from 194 on last visit.  LUNGS:  Clear.  CARDIAC:  Irregularly irregular.  S1 and S2.  No S3.  ABDOMEN:  Benign.  EXTREMITIES:  No edema.   IMPRESSION:  1.  Atrial fibrillation.  Doing well.  Would continue.  Note, he had a      Holter monitor done several years ago in 2004.  No pauses.  Average rate      was 86.  Would not change for now.  2.  Dyslipidemia.  The patient needs to come back for a fasting lipid panel      at his convenience.  3.  Erectile dysfunction.  Have given him a prescription for Viagra and      Cialis.  Again, I do not think he is at high risk for a cardiac event      with this, possibly some increased heart rate, but if he takes his time,      he should tolerate.   FOLLOW UP:  I will set followup for nine months, sooner if problems develop.                               Pricilla Riffle, MD, [facility]    PVR/MedQ  DD:  03/17/2006  DT:  03/18/2006  Job #:  [id number]   cc:   Jeannett Senior A. Clent Ridges, MD

## 2010-12-28 NOTE — Assessment & Plan Note (Signed)
[facility] OFFICE NOTE   Justin Gallegos, Justin Gallegos                     MRN:          [id number]  DATE:07/09/2006                            DOB:          May 15, 1929    This is a 77 year old gentleman here for a complete physical  examination.  He feels fine and has no complaints at all.  He is  requesting a shot to prevent shingles.  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  He continues to  see Dr. Dietrich Pates for cardiology care, including a history of chronic  atrial fibrillation.  He also sees Dr. Aldean Ast on a regular basis for  a history of benign prostatic hypertrophy.  He did have a full exam,  which was within normal limits, including a normal PSA with Dr.  Aldean Ast in September of this year.  Dr. Tenny Craw follows his blood work on  a regular basis for Coumadin levels, as well as his lipid levels.  For  other details of his past medical history, family history, social  history, habits, Melvern Banker, I refer you to our introductory note with  him dated May 24, 2003.   ALLERGIES:  None.   CURRENT MEDICATIONS:  1. Coumadin as directed daily.  2. Toprol XL 25 mg 1/2 tablet daily.  3. Multivitamins daily.  4. Fish oil 1200 mg daily.  5. Vytorin 10/40 one-half of a tablet daily.   OBJECTIVE:  Height 5 feet 11 inches, weight 188, BP 104/72, pulse 80 and  irregularly irregular.  GENERAL:  He appears to be doing well.  SKIN:  Clear.  EYES:  Clear sclerae.  OROPHARYNX:  Clear.  NECK:  Supple without lymphadenopathy or masses.  LUNGS:  Clear.  CARDIAC:  Rate is regular.  Rhythm is irregularly irregular.  There are  no gallops, murmurs, or rubs.  Distal pulses are full.  EKG shows atrial  fibrillation as usual.  ABDOMEN:  Soft, normal bowel sounds.  Nontender.  No masses.  EXTREMITIES:  No cyanosis, clubbing, or edema.  NEUROLOGIC:  Grossly intact.   ASSESSMENT AND  PLAN:  Problem #1:  Complete physical.  Encouraged him to  get regular exercise.  He is given a Zostavax vaccine today and we will  check routine laboratories today.  Problem #2:  Chronic atrial fibrillation, apparently stable.  Problem #3:  Hyperlipidemia, per Dr. Tenny Craw.  Problem #4:  Benign prostatic hypertrophy, stable.     Tera Mater. Clent Ridges, MD  Electronically Signed    SAF/MedQ  DD: 07/10/2006  DT: 07/10/2006  Job #: [id number]

## 2011-01-01 ENCOUNTER — Ambulatory Visit (INDEPENDENT_AMBULATORY_CARE_PROVIDER_SITE_OTHER): Payer: Medicare Other | Admitting: Family Medicine

## 2011-01-01 DIAGNOSIS — I4891 Unspecified atrial fibrillation: Secondary | ICD-10-CM

## 2011-01-01 LAB — POCT INR: INR: 3.3

## 2011-01-01 NOTE — Patient Instructions (Signed)
Same dose 

## 2011-01-23 ENCOUNTER — Telehealth: Payer: Self-pay | Admitting: *Deleted

## 2011-01-23 ENCOUNTER — Telehealth: Payer: Self-pay | Admitting: Internal Medicine

## 2011-01-23 NOTE — Telephone Encounter (Signed)
Sent to wrong md. 

## 2011-01-23 NOTE — Telephone Encounter (Signed)
Pt wanted to let Dr. Clent Ridges know that he will be going off of Coumadin for 6 days to have a biopsy in the hospital.  They think it is another cancer.

## 2011-01-23 NOTE — Telephone Encounter (Signed)
Pt calling to let us know he went off coumadin yesterday and will not go back on unitil 6-20 due to bladder surgery, may have another cancer

## 2011-01-28 NOTE — Telephone Encounter (Signed)
Noted  

## 2011-01-29 ENCOUNTER — Other Ambulatory Visit: Payer: Self-pay | Admitting: Urology

## 2011-01-29 ENCOUNTER — Ambulatory Visit (HOSPITAL_BASED_OUTPATIENT_CLINIC_OR_DEPARTMENT_OTHER)
Admission: RE | Admit: 2011-01-29 | Discharge: 2011-01-29 | Disposition: A | Discharge status: Home / Self Care | Payer: Medicare Other | Source: Ambulatory Visit | Attending: Urology | Admitting: Urology

## 2011-01-29 DIAGNOSIS — E7800 Pure hypercholesterolemia, unspecified: Secondary | ICD-10-CM | POA: Insufficient documentation

## 2011-01-29 DIAGNOSIS — Z7901 Long term (current) use of anticoagulants: Secondary | ICD-10-CM | POA: Insufficient documentation

## 2011-01-29 DIAGNOSIS — C672 Malignant neoplasm of lateral wall of bladder: Secondary | ICD-10-CM | POA: Insufficient documentation

## 2011-01-29 DIAGNOSIS — Z79899 Other long term (current) drug therapy: Secondary | ICD-10-CM | POA: Insufficient documentation

## 2011-01-29 DIAGNOSIS — Z01812 Encounter for preprocedural laboratory examination: Secondary | ICD-10-CM | POA: Insufficient documentation

## 2011-01-29 DIAGNOSIS — I4891 Unspecified atrial fibrillation: Secondary | ICD-10-CM | POA: Insufficient documentation

## 2011-01-29 DIAGNOSIS — R7989 Other specified abnormal findings of blood chemistry: Secondary | ICD-10-CM | POA: Insufficient documentation

## 2011-01-29 LAB — CBC
HCT: 42.3 % (ref 39.0–52.0)
Hemoglobin: 15 g/dL (ref 13.0–17.0)
RDW: 13 % (ref 11.5–15.5)
WBC: 6.5 10*3/uL (ref 4.0–10.5)

## 2011-01-29 LAB — BASIC METABOLIC PANEL
CO2: 23 mEq/L (ref 19–32)
Calcium: 8.8 mg/dL (ref 8.4–10.5)
Chloride: 109 mEq/L (ref 96–112)
Sodium: 141 mEq/L (ref 135–145)

## 2011-01-29 LAB — PROTIME-INR: INR: 1.14 (ref 0.00–1.49)

## 2011-01-31 NOTE — Op Note (Signed)
  NAMEMarland Kitchen  Justin Gallegos, Justin NO.:  [id number]  MEDICAL RECORD NO.:  [id number]  LOCATION:  3003                         FACILITY:  [facility]  PHYSICIAN:  Courtney Paris, M.D.DATE OF BIRTH:  November 15, 1928  DATE OF PROCEDURE:  01/29/2011 DATE OF DISCHARGE:  07/29/2010                              OPERATIVE REPORT   PREOPERATIVE DIAGNOSES:  Recurrent bladder cancer, right lateral wall, malignant cytology, azotemia.  POSTOPERATIVE DIAGNOSIS:  Recurrent bladder cancer, right lateral wall, malignant cytology, azotemia.  OPERATION:  Cystoscopy, right retrograde pyelogram, transurethral resection of bladder tumor 3 cm of right lateral wall.  ANESTHESIA:  General.  SURGEON:  Courtney Paris, M.D.  BRIEF HISTORY:  This 77 year old patient had previous cancer of the bladder with previous laparoscopic left nephroureterectomy in September 2010 for high-grade TCC of the ureter.  Cystoscopy in June 2012 showed a solitary tumor in the right bladder base near some bladder diverticula. He does have malignant cells on cytology.  Because his renal function is slightly impaired with creatinine of 2.0, he enters for cysto and retrograde at this time as well.  His warfarin was stopped 6 days ago for atrial fibrillation and has had medical clearance.  DESCRIPTION OF PROCEDURE:  The patient was placed on the operating table in dorsal lithotomy position.  After satisfactory induction of general anesthesia, he was prepped and draped with Betadine in the usual sterile fashion, given IV Cipro.  Time-out was then performed and the patient and procedure then reconfirmed.  A 21 panendoscope was inserted.  The anterior urethra was normal, no strictures.  Posterior urethra was mildly enlarged.  Prostate and the bladder was entered.  The bladder had numerous small diverticula present and on the right lateral wall there was some sessile area of erythematous probable tumor.  Pictures  were made and using the cold cup biopsy forceps I removed all the tissue for about 3 cm.  Used the Bugbee electrode to effect good hemostasis. Pictures were made afterwards as well.  Following this, a 6 open-ended ureteral catheter was inserted in the right ureteral orifice and occlusive retrograde demonstrated a normal- appearing ureter and upper tracts of the right kidney.  There was satisfactory drain out.  The bladder was drained, scope removed.  The patient given a B and O suppository and he was taken to recovery room in good condition, will be later discharged as an outpatient, will come back to the office next week for followup.     Courtney Paris, M.D.     HMK/MEDQ  D:  01/29/2011  T:  01/29/2011  Job:  [id number]  Electronically Signed by Vic Blackbird M.D. on 01/31/2011 05:35:17 PM

## 2011-02-05 ENCOUNTER — Ambulatory Visit (INDEPENDENT_AMBULATORY_CARE_PROVIDER_SITE_OTHER): Payer: Medicare Other

## 2011-02-05 DIAGNOSIS — I4891 Unspecified atrial fibrillation: Secondary | ICD-10-CM

## 2011-02-05 LAB — POCT INR: INR: 1.7

## 2011-02-05 NOTE — Patient Instructions (Signed)
Same dose 

## 2011-02-19 ENCOUNTER — Ambulatory Visit: Payer: Medicare Other

## 2011-02-19 ENCOUNTER — Ambulatory Visit (INDEPENDENT_AMBULATORY_CARE_PROVIDER_SITE_OTHER): Payer: Medicare Other | Admitting: Internal Medicine

## 2011-02-19 ENCOUNTER — Encounter: Payer: Self-pay | Admitting: Internal Medicine

## 2011-02-19 DIAGNOSIS — I4891 Unspecified atrial fibrillation: Secondary | ICD-10-CM

## 2011-02-19 DIAGNOSIS — Z7901 Long term (current) use of anticoagulants: Secondary | ICD-10-CM | POA: Insufficient documentation

## 2011-02-19 DIAGNOSIS — L723 Sebaceous cyst: Secondary | ICD-10-CM

## 2011-02-19 DIAGNOSIS — L089 Local infection of the skin and subcutaneous tissue, unspecified: Secondary | ICD-10-CM | POA: Insufficient documentation

## 2011-02-19 LAB — POCT INR: INR: 2.6

## 2011-02-19 MED ORDER — DOXYCYCLINE HYCLATE 100 MG PO TABS: 100.0000 mg | ORAL_TABLET | Freq: Two times a day (BID) | ORAL | Status: DC

## 2011-02-19 NOTE — Assessment & Plan Note (Addendum)
Begin antibiotic therapy. Followup closely if no improvement or worsening. Consider I&D vs. excision if persists and symptomatic

## 2011-02-19 NOTE — Progress Notes (Signed)
  Subjective:    Patient ID: Justin Gallegos, male    DOB: February 24, 1929, 77 y.o.   MRN: [id number]  HPI Pt presents to clinic for evaluation of soft tissue mass of buttock. Notes mild soft tissue mass located left buttock present for several months however recently over the past several days has become painful. Worsened by sitting or direct pressure. Denies fever chills injury trauma or drainage from the area. No other exacerbating or alleviating factors. Does take chronic anticoagulation without gross active bleeding. No other complaints  Reviewed past medical history, medications and allergies.  Review of Systems  Constitutional: Negative for fever, chills and diaphoresis.  Skin: Negative for color change, pallor, rash and wound.  Hematological: Does not bruise/bleed easily.        Objective:   Physical Exam  Nursing note and vitals reviewed. Constitutional: He appears well-developed and well-nourished. No distress.  HENT:  Head: Normocephalic and atraumatic.  Eyes: Conjunctivae are normal. No scleral icterus.  Neurological: He is alert.  Skin: Skin is warm and dry. He is not diaphoretic.       Left buttock area examined. Soft tissue mass noted approximately 3 cm transverse diameter. Small punctate opening without drainage. No erythema or warmth. There is extremely tender to the touch.  Psychiatric: He has a normal mood and affect.          Assessment & Plan:

## 2011-02-19 NOTE — Patient Instructions (Signed)
Same Dose?

## 2011-02-19 NOTE — Assessment & Plan Note (Signed)
Discussed potential for antibiotic therapy to alter/raise INR. PT/INR check due today and will schedule followup INR sooner than typical while on antibiotics

## 2011-02-22 ENCOUNTER — Ambulatory Visit (INDEPENDENT_AMBULATORY_CARE_PROVIDER_SITE_OTHER): Payer: Medicare Other

## 2011-02-22 DIAGNOSIS — I4891 Unspecified atrial fibrillation: Secondary | ICD-10-CM

## 2011-02-22 DIAGNOSIS — Z5181 Encounter for therapeutic drug level monitoring: Secondary | ICD-10-CM

## 2011-02-22 DIAGNOSIS — Z7901 Long term (current) use of anticoagulants: Secondary | ICD-10-CM

## 2011-02-22 NOTE — Patient Instructions (Signed)
Same dose 

## 2011-02-27 ENCOUNTER — Encounter: Payer: Self-pay | Admitting: Family Medicine

## 2011-02-27 ENCOUNTER — Ambulatory Visit (INDEPENDENT_AMBULATORY_CARE_PROVIDER_SITE_OTHER): Payer: Medicare Other | Admitting: Family Medicine

## 2011-02-27 VITALS — BP 104/66 | HR 104 | Temp 98.6°F | Wt 194.0 lb

## 2011-02-27 DIAGNOSIS — L03317 Cellulitis of buttock: Secondary | ICD-10-CM

## 2011-02-27 DIAGNOSIS — L0231 Cutaneous abscess of buttock: Secondary | ICD-10-CM

## 2011-02-27 MED ORDER — SULFAMETHOXAZOLE-TRIMETHOPRIM 800-160 MG PO TABS: 1.0000 | ORAL_TABLET | Freq: Two times a day (BID) | ORAL | Status: AC

## 2011-02-27 NOTE — Progress Notes (Signed)
  Subjective:    Patient ID: Justin Gallegos, male    DOB: [birth year], 77 y.o.   MRN: [id number]  HPI Here to recheck an abscessed cyst on the left buttock. It has been present for several months, but it became suddenly painful about 2 weeks ago. He saw Dr. Rodena Medin one week ago and was placed on Doxycycline. The cyst has gotten a little less tender since then. No fevers. He has developed some crampy pains in the mid abdomen in the past 3 days. No nausea or diarrhea.    Review of Systems  Constitutional: Negative.   Respiratory: Negative.   Cardiovascular: Negative.   Gastrointestinal: Positive for abdominal pain. Negative for nausea, vomiting, diarrhea, constipation, blood in stool and abdominal distention.  Skin: Positive for wound.       Objective:   Physical Exam  Constitutional: He appears well-developed and well-nourished.  Abdominal: Soft. Bowel sounds are normal. He exhibits no distension and no mass. There is no tenderness. There is no rebound and no guarding.  Skin:       The left buttock has an abscessed cyst about 2 cm in diameter. Very tender. This is not near the rectum          Assessment & Plan:  The cyst was cleansed with alcohol and LA was achieved using 1% Lidocaine with epinephrine. It was lanced with a scalpel, and a large amount of purulent material was expressed. This was dressed with gauze. A sample is sent for culture. I think his abdominal pain is a side effect of the Doxycycline, so we will stop this and switch to Bactrim DS for a week. Try hot tub soaks prn .

## 2011-03-02 LAB — WOUND CULTURE

## 2011-03-25 ENCOUNTER — Other Ambulatory Visit: Payer: Self-pay | Admitting: Urology

## 2011-03-25 ENCOUNTER — Encounter (HOSPITAL_COMMUNITY): Payer: Medicare Other

## 2011-03-25 LAB — CBC
Hemoglobin: 14.9 g/dL (ref 13.0–17.0)
MCH: 31.2 pg (ref 26.0–34.0)
MCV: 90.1 fL (ref 78.0–100.0)
Platelets: 195 10*3/uL (ref 150–400)
RBC: 4.77 MIL/uL (ref 4.22–5.81)

## 2011-03-25 LAB — BASIC METABOLIC PANEL
CO2: 24 mEq/L (ref 19–32)
Glucose, Bld: 70 mg/dL (ref 70–99)
Potassium: 4.9 mEq/L (ref 3.5–5.1)
Sodium: 138 mEq/L (ref 135–145)

## 2011-03-25 LAB — SURGICAL PCR SCREEN: Staphylococcus aureus: POSITIVE — AB

## 2011-03-26 ENCOUNTER — Ambulatory Visit (INDEPENDENT_AMBULATORY_CARE_PROVIDER_SITE_OTHER): Payer: Medicare Other | Admitting: Family Medicine

## 2011-03-26 DIAGNOSIS — I4891 Unspecified atrial fibrillation: Secondary | ICD-10-CM

## 2011-03-26 LAB — POCT INR: INR: 3.2

## 2011-03-26 NOTE — Patient Instructions (Signed)
Same dose 

## 2011-04-01 ENCOUNTER — Other Ambulatory Visit: Payer: Self-pay | Admitting: Urology

## 2011-04-01 ENCOUNTER — Ambulatory Visit (HOSPITAL_COMMUNITY)
Admission: RE | Admit: 2011-04-01 | Discharge: 2011-04-01 | Disposition: A | Discharge status: Home / Self Care | Payer: Medicare Other | Source: Ambulatory Visit | Attending: Urology | Admitting: Urology

## 2011-04-01 DIAGNOSIS — Z01812 Encounter for preprocedural laboratory examination: Secondary | ICD-10-CM | POA: Insufficient documentation

## 2011-04-01 DIAGNOSIS — Z8553 Personal history of malignant neoplasm of renal pelvis: Secondary | ICD-10-CM | POA: Insufficient documentation

## 2011-04-01 DIAGNOSIS — Z7901 Long term (current) use of anticoagulants: Secondary | ICD-10-CM | POA: Insufficient documentation

## 2011-04-01 DIAGNOSIS — C679 Malignant neoplasm of bladder, unspecified: Secondary | ICD-10-CM | POA: Insufficient documentation

## 2011-04-01 DIAGNOSIS — I4891 Unspecified atrial fibrillation: Secondary | ICD-10-CM | POA: Insufficient documentation

## 2011-04-01 DIAGNOSIS — I10 Essential (primary) hypertension: Secondary | ICD-10-CM | POA: Insufficient documentation

## 2011-04-01 LAB — PROTIME-INR: INR: 1.16 (ref 0.00–1.49)

## 2011-04-01 NOTE — Op Note (Signed)
NAME:  EDMAN, LIPSEY NO.:  [id number]  MEDICAL RECORD NO.:  [id number]  LOCATION:  DAYL                         FACILITY:  [facility]  PHYSICIAN:  Heloise Purpura, MD      DATE OF BIRTH:  1929-04-04  DATE OF PROCEDURE:  04/01/2011 DATE OF DISCHARGE:                              OPERATIVE REPORT   PREOPERATIVE DIAGNOSES: 1. 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 2. Urothelial carcinoma of the bladder.  POSTOPERATIVE DIAGNOSES: 1. 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 2. Urothelial carcinoma of the bladder.  PROCEDURES: 1. Cystoscopy. 2. Right retrograde pyelography with interpretation. 3. Bladder biopsies.  INTRAOPERATIVE FINDINGS:  Right retrograde pyelography demonstrated a normal caliber ureter without evidence of filling defects.  The right renal collecting system was also visualized without evidence for filling defects or tumor.  SURGEON:  Heloise Purpura, M.D.ANESTHESIA:  General.  COMPLICATIONS:  None.  SPECIMENS: 1. Right lateral bladder wall (prior tumor resection site). 2. Trigone.  DISPOSITION OF SPECIMEN:  To pathology.  INDICATION:  Mr. Alter is an 77 year old gentleman with a history of an upper tract urothelial carcinoma of the left renal pelvis.  He is status post a left nephroureterectomy.  He was undergoing a surveillance followup by Dr. Aldean Ast and was found to have a bladder recurrence and subsequently underwent transurethral resection of this tumor in June of this year.  He was diagnosed with a high-grade noninvasive tumor, although specific pathologic features indicating whether this was a Ta or T1 lesion were not noted on that pathology report.  Dr. Aldean Ast had set the patient up to undergo BCG intravesical therapy and following his retirement, the patient was scheduled to have this performed under my care.  Prior to starting BCG, I did review his history and determined that  there was no definite pathologic stage associated with his transurethral resection specimen.  I contacted Dr. Dierdre Searles in pathology and she did confirm this to be a high-grade T1 lesion with no evidence of muscularis propria invasion.  I therefore recommended the patient that we postponed his intravesical therapy and proceed with a restaging TUR. The potential risks, complications, and alternative treatment options were discussed in detail and informed consent obtained.  DESCRIPTION OF PROCEDURE:  The patient was taken to the operating room and a general anesthetic was administered.  He was given preoperative antibiotics, placed in the dorsal lithotomy position, and prepped and draped in the usual sterile fashion.  Next, preoperative time-out was performed.  Cystourethroscopy was then performed, which revealed a normal anterior and posterior urethra.  Inspection of the bladder revealed the right ureteral orifice to be in its expected position with no evidence of any blood from the right ureteral orifice.  There was noted be an area near the left hemi-trigone, which did appear to be somewhat raised, although possibly consistent with postoperative changes related to his prior nephroureterectomy and no definite evidence for tumor in this area.  An inspection of the bladder was then performed systematically with both the 12 and 70-degree lenses.  No bladder tumors were identified.  The scar from his prior resection of the right lateral wall of the bladder was  identified.  A 6-French ureteral catheter was then used to intubate the right ureteral orifice and Omnipaque contrast was injected.  This demonstrated no abnormalities as stated above. Attention then returned to the bladder and a cold cup biopsy forceps was used to take multiple biopsies of the scarred prior resection site on the right lateral bladder wall.  Additional biopsies were obtained from the left hemi-trigone.  These areas were then  fulgurated with a Bugbee electrode.  The bladder was emptied and reinspected and hemostasis appeared excellent at this point.  The bladder was therefore emptied and the procedure ended.  He tolerated the procedure well without complications.  At the end of the procedure, a pelvic exam under anesthesia was performed.  No evidence of any 3-dimensional pelvic masses noted.     Heloise Purpura, MD     LB/MEDQ  D:  04/01/2011  T:  04/01/2011  Job:  [id number]  Electronically Signed by Heloise Purpura MD on 04/01/2011 06:49:06 PM

## 2011-04-04 ENCOUNTER — Other Ambulatory Visit: Payer: Self-pay | Admitting: Family Medicine

## 2011-04-08 NOTE — Telephone Encounter (Signed)
Script sent e-scribe 

## 2011-04-16 ENCOUNTER — Ambulatory Visit (INDEPENDENT_AMBULATORY_CARE_PROVIDER_SITE_OTHER): Payer: Medicare Other | Admitting: Family Medicine

## 2011-04-16 DIAGNOSIS — I4891 Unspecified atrial fibrillation: Secondary | ICD-10-CM

## 2011-04-16 LAB — POCT INR: INR: 2.1

## 2011-04-16 NOTE — Patient Instructions (Signed)
Same Dose Check in 1 month

## 2011-05-14 ENCOUNTER — Ambulatory Visit (INDEPENDENT_AMBULATORY_CARE_PROVIDER_SITE_OTHER): Payer: Medicare Other | Admitting: Family Medicine

## 2011-05-14 DIAGNOSIS — I4891 Unspecified atrial fibrillation: Secondary | ICD-10-CM

## 2011-05-14 DIAGNOSIS — Z7901 Long term (current) use of anticoagulants: Secondary | ICD-10-CM

## 2011-05-14 LAB — POCT INR: INR: 3.5

## 2011-05-14 NOTE — Patient Instructions (Signed)
  Latest dosing instructions   Total Sun Mon Tue Wed Thu Fri Sat   30 2.5 mg 5 mg 5 mg 2.5 mg 5 mg 5 mg 5 mg    (5 mg0.5) (5 mg1) (5 mg1) (5 mg0.5) (5 mg1) (5 mg1) (5 mg1)        

## 2011-05-20 ENCOUNTER — Other Ambulatory Visit: Payer: Self-pay | Admitting: Internal Medicine

## 2011-06-18 ENCOUNTER — Ambulatory Visit (INDEPENDENT_AMBULATORY_CARE_PROVIDER_SITE_OTHER): Payer: Medicare Other | Admitting: Family Medicine

## 2011-06-18 DIAGNOSIS — Z23 Encounter for immunization: Secondary | ICD-10-CM

## 2011-06-18 DIAGNOSIS — I4891 Unspecified atrial fibrillation: Secondary | ICD-10-CM

## 2011-06-18 LAB — POCT INR: INR: 2.9

## 2011-06-18 NOTE — Patient Instructions (Signed)
  Latest dosing instructions   Total [name] Tue Wed Thu Fri Sat   30 2.5 mg 5 mg 5 mg 2.5 mg 5 mg 5 mg 5 mg    (5 mg0.5) (5 mg1) (5 mg1) (5 mg0.5) (5 mg1) (5 mg1) (5 mg1)

## 2011-06-24 ENCOUNTER — Other Ambulatory Visit: Payer: Self-pay | Admitting: Family Medicine

## 2011-07-01 ENCOUNTER — Other Ambulatory Visit: Payer: Self-pay | Admitting: Internal Medicine

## 2011-07-12 ENCOUNTER — Encounter: Payer: Self-pay | Admitting: *Deleted

## 2011-07-15 ENCOUNTER — Ambulatory Visit (INDEPENDENT_AMBULATORY_CARE_PROVIDER_SITE_OTHER): Payer: Medicare Other | Admitting: Internal Medicine

## 2011-07-15 ENCOUNTER — Encounter: Payer: Self-pay | Admitting: Internal Medicine

## 2011-07-15 VITALS — BP 129/86 | HR 101 | Ht 71.0 in | Wt 200.0 lb

## 2011-07-15 DIAGNOSIS — I4891 Unspecified atrial fibrillation: Secondary | ICD-10-CM

## 2011-07-15 DIAGNOSIS — I10 Essential (primary) hypertension: Secondary | ICD-10-CM

## 2011-07-15 DIAGNOSIS — E785 Hyperlipidemia, unspecified: Secondary | ICD-10-CM

## 2011-07-15 NOTE — Progress Notes (Addendum)
Patient ID: Justin Gallegos, male   DOB: [birth year], 77 y.o.   MRN: [id number] HPI Patient is a 77 year old with a history of atrial fibrillation and dyslipidiemia I saw him in clinic about 1 year ago.  Since seen he has done well . Denies CP.  No dizziness.  No palpitations.  Remains active Allergies  Allergen Reactions  . NWG:NFAOZHYQMVH+QIONGEXBM+WUXLKGMWNU Acid+Aspartame     REACTION: throat swelling    Current Outpatient Prescriptions  Medication Sig Dispense Refill  . fenofibrate 160 MG tablet TAKE ONE TABLET BY MOUTH EVERY DAY  30 tablet  0  . fish oil-omega-3 fatty acids 1000 MG capsule Take 1 g by mouth daily.        . fluticasone (FLONASE) 50 MCG/ACT nasal spray Place 2 sprays into the nose as needed.       . metoprolol succinate (TOPROL-XL) 25 MG 24 hr tablet        . simvastatin (ZOCOR) 40 MG tablet TAKE ONE TABLET BY MOUTH EVERY DAY  30 tablet  2  . warfarin (COUMADIN) 5 MG tablet TAKE AS DIRECTED  30 tablet  1    Past Medical History  Diagnosis Date  . Hyperlipidemia   . Hypertension   . Arthritis   . Cancer     ureteral  . Low back pain   . TIA (transient ischemic attack)   . Atrial fibrillation   . Hemorrhoids     Past Surgical History  Procedure Date  . Appendectomy   . Tonsillectomy   . Hernia repair   . Nephrectomy   . Ureterectomy     Family History  Problem Relation Age of Onset  . Coronary artery disease      fhx  . Stroke      fhx    History   Social History  . Marital Status: Married    Spouse Name: N/A    Number of Children: N/A  . Years of Education: N/A   Occupational History  . Not on file.   Social History Main Topics  . Smoking status: Never Smoker   . Smokeless tobacco: Not on file  . Alcohol Use: No  . Drug Use: No  . Sexually Active:    Other Topics Concern  . Not on file   Social History Narrative  . No narrative on file    Review of Systems:  All systems reviewed.  They are negative to the above problem  except as previously stated.  Vital Signs: BP 129/86  Pulse 101  Ht 5\' 11"  (1.803 m)  Wt 200 lb (90.719 kg)  BMI 27.89 kg/m2  Physical Exam  Patient is in NAD  HEENT:  Normocephalic, atraumatic. EOMI, PERRLA.  Neck: JVP is normal. No thyromegaly. No bruits.  Lungs: clear to auscultation. No rales no wheezes.  Heart:  Irregular rate and rhythm. Normal S1, S2. No S3.   No significant murmurs. PMI not displaced.  Abdomen:  Supple, nontender. Normal bowel sounds. No masses. No hepatomegaly.  Extremities:   Good distal pulses throughout. No lower extremity edema.  Musculoskeletal :moving all extremities.  Neuro:   alert and oriented x3.  CN II-XII grossly intact.  EKG:  Atrial fibrillation.  103 bpm.   Assessment and Plan:

## 2011-07-15 NOTE — Patient Instructions (Signed)
Your physician wants you to follow-up in:  12 months.  You will receive a reminder letter in the mail two months in advance. If you don't receive a letter, please call our office to schedule the follow-up appointment.   

## 2011-07-16 NOTE — Assessment & Plan Note (Signed)
Continue meds. 

## 2011-07-16 NOTE — Assessment & Plan Note (Addendum)
Continue rate control  Patient remains asymptomatic.  Continue coumadin.

## 2011-07-16 NOTE — Assessment & Plan Note (Signed)
Scheduled to get lipids checked

## 2011-07-22 ENCOUNTER — Other Ambulatory Visit: Payer: Self-pay | Admitting: Family Medicine

## 2011-07-23 ENCOUNTER — Ambulatory Visit (INDEPENDENT_AMBULATORY_CARE_PROVIDER_SITE_OTHER): Payer: Medicare Other

## 2011-07-23 DIAGNOSIS — I4891 Unspecified atrial fibrillation: Secondary | ICD-10-CM

## 2011-07-23 NOTE — Patient Instructions (Signed)
  Latest dosing instructions   Total Sun Mon Tue Wed Thu Fri Sat   30 2.5 mg 5 mg 5 mg 2.5 mg 5 mg 5 mg 5 mg    (5 mg0.5) (5 mg1) (5 mg1) (5 mg0.5) (5 mg1) (5 mg1) (5 mg1)        

## 2011-08-13 HISTORY — PX: CATARACT EXTRACTION, BILATERAL: SHX1313

## 2011-08-14 ENCOUNTER — Encounter: Payer: Self-pay | Admitting: Family

## 2011-08-14 ENCOUNTER — Ambulatory Visit (INDEPENDENT_AMBULATORY_CARE_PROVIDER_SITE_OTHER): Payer: Medicare Other | Admitting: Family

## 2011-08-14 DIAGNOSIS — J209 Acute bronchitis, unspecified: Secondary | ICD-10-CM

## 2011-08-14 DIAGNOSIS — R05 Cough: Secondary | ICD-10-CM

## 2011-08-14 MED ORDER — AZITHROMYCIN 250 MG PO TABS: 250.0000 mg | ORAL_TABLET | Freq: Every day | ORAL | Status: AC

## 2011-08-14 MED ORDER — GUAIFENESIN-CODEINE 100-10 MG/5ML PO SYRP: 5.0000 mL | ORAL_SOLUTION | Freq: Three times a day (TID) | ORAL | Status: AC | PRN

## 2011-08-14 NOTE — Progress Notes (Signed)
Subjective:    Patient ID: Justin Gallegos, male    DOB: 01-Sep-1928, 78 y.o.   MRN: [id number]  HPI 78 year old white male, patient of Dr. Clent Ridges, nonsmoker exam with a ten-day history of cough, chest congestion that has worsened over the last day. He's been sick for 10 days. Has taken Mucinex DM for cough tends to have difficulty urinating so he discontinued. Is a past medical history of acute bronchitis.  Review of Systems  Constitutional: Negative.   HENT: Positive for congestion.   Respiratory: Positive for cough.   Cardiovascular: Negative.   Gastrointestinal: Negative.   Musculoskeletal: Negative.   Skin: Negative.   Neurological: Negative.   Hematological: Negative.   Psychiatric/Behavioral: Negative.        Past Medical History  Diagnosis Date  . Hyperlipidemia   . Hypertension   . Arthritis   . Cancer     ureteral  . Low back pain   . TIA (transient ischemic attack)   . Campath-induced atrial fibrillation   . Hemorrhoids     History   Social History  . Marital Status: Married    Spouse Name: N/A    Number of Children: N/A  . Years of Education: N/A   Occupational History  . Not on file.   Social History Main Topics  . Smoking status: Never Smoker   . Smokeless tobacco: Not on file  . Alcohol Use: No  . Drug Use: No  . Sexually Active:    Other Topics Concern  . Not on file   Social History Narrative  . No narrative on file    Past Surgical History  Procedure Date  . Appendectomy   . Tonsillectomy   . Hernia repair   . Nephrectomy   . Ureterectomy     Family History  Problem Relation Age of Onset  . Coronary artery disease      fhx  . Stroke      fhx    Allergies  Allergen Reactions  . WUJ:WJXBJYNWGNF+AOZHYQMVH+QIONGEXBMW Acid+Aspartame     REACTION: throat swelling    Current Outpatient Prescriptions on File Prior to Visit  Medication Sig Dispense Refill  . fenofibrate 160 MG tablet TAKE ONE TABLET BY MOUTH EVERY DAY  30  tablet  0  . fish oil-omega-3 fatty acids 1000 MG capsule Take 1 g by mouth daily.        . fluticasone (FLONASE) 50 MCG/ACT nasal spray Place 2 sprays into the nose as needed.       . metoprolol succinate (TOPROL-XL) 25 MG 24 hr tablet        . simvastatin (ZOCOR) 40 MG tablet TAKE ONE TABLET BY MOUTH EVERY DAY  30 tablet  2  . warfarin (COUMADIN) 5 MG tablet TAKE AS DIRECTED  30 tablet  1    BP 126/76  Pulse 113  Temp(Src) 98.3 F (36.8 C) (Oral)  Wt 202 lb (91.627 kg)  SpO2 98%chart Objective:   Physical Exam  Constitutional: He is oriented to person, place, and time. He appears well-developed and well-nourished.  HENT:  Right Ear: External ear normal.  Left Ear: External ear normal.  Nose: Nose normal.  Mouth/Throat: Oropharynx is clear and moist.  Neck: Normal range of motion. Neck supple.  Cardiovascular: Normal rate, regular rhythm and normal heart sounds.   Pulmonary/Chest: Effort normal and breath sounds normal.  Musculoskeletal: Normal range of motion.  Neurological: He is oriented to person, place, and time.  Skin: Skin is warm and dry.  Psychiatric: He has a normal mood and affect.          Assessment & Plan:   Assessment: Acute bronchitis  Plan: Z-Pak as directed. Robitussin-AC 1 teaspoon every 6-8 hours when necessary cough. Patient to call if symptoms worsen or persist. Check a schedule and when necessary.

## 2011-08-14 NOTE — Patient Instructions (Signed)

## 2011-08-19 ENCOUNTER — Encounter: Payer: Self-pay | Admitting: Family Medicine

## 2011-08-19 ENCOUNTER — Ambulatory Visit (INDEPENDENT_AMBULATORY_CARE_PROVIDER_SITE_OTHER): Payer: Medicare Other | Admitting: Family Medicine

## 2011-08-19 VITALS — BP 126/84 | HR 110 | Temp 97.9°F | Wt 199.0 lb

## 2011-08-19 DIAGNOSIS — J40 Bronchitis, not specified as acute or chronic: Secondary | ICD-10-CM

## 2011-08-19 MED ORDER — HYDROCODONE-HOMATROPINE 5-1.5 MG/5ML PO SYRP: 5.0000 mL | ORAL_SOLUTION | ORAL | Status: AC | PRN

## 2011-08-19 NOTE — Progress Notes (Signed)
  Subjective:    Patient ID: Justin Gallegos, male    DOB: 1928/11/04, 78 y.o.   MRN: [id number]  HPI Here to follow up on bronchitis. He was here on 08-14-11 and was given a Zpack. Today he feels better and his cough is better, though he still has coughing bouts at night. The cough is dry. No fever or chest pain. He is due to start another round of chemotherapy for this ureteral cancer this week.    Review of Systems  Constitutional: Negative.   HENT: Negative.   Eyes: Negative.   Respiratory: Positive for cough.   Cardiovascular: Negative.        Objective:   Physical Exam  Constitutional: He appears well-developed and well-nourished.  Neck: No thyromegaly present.  Cardiovascular: Normal heart sounds and intact distal pulses.        Irregular rhythm , mildly increased rate   Pulmonary/Chest: Effort normal and breath sounds normal.          Assessment & Plan:  He seems to be over the worst of this. Recheck prn

## 2011-08-22 ENCOUNTER — Encounter: Payer: Medicare Other | Admitting: Family Medicine

## 2011-08-22 ENCOUNTER — Other Ambulatory Visit: Payer: Self-pay | Admitting: Family Medicine

## 2011-08-26 ENCOUNTER — Other Ambulatory Visit: Payer: Self-pay | Admitting: Family Medicine

## 2011-08-27 ENCOUNTER — Ambulatory Visit (INDEPENDENT_AMBULATORY_CARE_PROVIDER_SITE_OTHER): Payer: Medicare Other | Admitting: Family Medicine

## 2011-08-27 DIAGNOSIS — I4891 Unspecified atrial fibrillation: Secondary | ICD-10-CM

## 2011-08-27 LAB — POCT INR: INR: 2.6

## 2011-08-27 NOTE — Patient Instructions (Signed)
  Latest dosing instructions   Total Sun Mon Tue Wed Thu Fri Sat   30 2.5 mg 5 mg 5 mg 2.5 mg 5 mg 5 mg 5 mg    (5 mg0.5) (5 mg1) (5 mg1) (5 mg0.5) (5 mg1) (5 mg1) (5 mg1)        

## 2011-09-25 ENCOUNTER — Other Ambulatory Visit: Payer: Self-pay | Admitting: Internal Medicine

## 2011-10-01 ENCOUNTER — Ambulatory Visit (INDEPENDENT_AMBULATORY_CARE_PROVIDER_SITE_OTHER): Payer: Medicare Other | Admitting: Family Medicine

## 2011-10-01 DIAGNOSIS — Z5181 Encounter for therapeutic drug level monitoring: Secondary | ICD-10-CM

## 2011-10-01 DIAGNOSIS — Z7901 Long term (current) use of anticoagulants: Secondary | ICD-10-CM

## 2011-10-01 DIAGNOSIS — I4891 Unspecified atrial fibrillation: Secondary | ICD-10-CM

## 2011-10-01 LAB — POCT INR: INR: 3.5

## 2011-10-01 NOTE — Patient Instructions (Addendum)
Same Dose, 2.5 mg on wednesdays and sundays 5 mg on other days, check in 4 weeks.    Latest dosing instructions   Total [name] Tue Wed Thu Fri Sat   30 2.5 mg 5 mg 5 mg 2.5 mg 5 mg 5 mg 5 mg    (5 mg0.5) (5 mg1) (5 mg1) (5 mg0.5) (5 mg1) (5 mg1) (5 mg1)

## 2011-10-29 ENCOUNTER — Ambulatory Visit (INDEPENDENT_AMBULATORY_CARE_PROVIDER_SITE_OTHER): Payer: Medicare Other

## 2011-10-29 DIAGNOSIS — Z7901 Long term (current) use of anticoagulants: Secondary | ICD-10-CM

## 2011-10-29 DIAGNOSIS — Z5181 Encounter for therapeutic drug level monitoring: Secondary | ICD-10-CM

## 2011-10-29 DIAGNOSIS — I4891 Unspecified atrial fibrillation: Secondary | ICD-10-CM

## 2011-10-29 NOTE — Patient Instructions (Signed)
  Latest dosing instructions   Total Sun Mon Tue Wed Thu Fri Sat   30 2.5 mg 5 mg 5 mg 2.5 mg 5 mg 5 mg 5 mg    (5 mg0.5) (5 mg1) (5 mg1) (5 mg0.5) (5 mg1) (5 mg1) (5 mg1)        

## 2011-11-20 ENCOUNTER — Other Ambulatory Visit: Payer: Self-pay | Admitting: Family Medicine

## 2011-12-03 ENCOUNTER — Ambulatory Visit (INDEPENDENT_AMBULATORY_CARE_PROVIDER_SITE_OTHER): Payer: Medicare Other | Admitting: Family Medicine

## 2011-12-03 DIAGNOSIS — I4891 Unspecified atrial fibrillation: Secondary | ICD-10-CM

## 2011-12-03 NOTE — Patient Instructions (Signed)
  Latest dosing instructions   Total Sun Mon Tue Wed Thu Fri Sat   30 2.5 mg 5 mg 5 mg 2.5 mg 5 mg 5 mg 5 mg    (5 mg0.5) (5 mg1) (5 mg1) (5 mg0.5) (5 mg1) (5 mg1) (5 mg1)        

## 2011-12-12 ENCOUNTER — Other Ambulatory Visit (INDEPENDENT_AMBULATORY_CARE_PROVIDER_SITE_OTHER): Payer: Medicare Other

## 2011-12-12 DIAGNOSIS — Z125 Encounter for screening for malignant neoplasm of prostate: Secondary | ICD-10-CM

## 2011-12-12 DIAGNOSIS — Z0000 Encounter for general adult medical examination without abnormal findings: Secondary | ICD-10-CM

## 2011-12-12 DIAGNOSIS — E785 Hyperlipidemia, unspecified: Secondary | ICD-10-CM

## 2011-12-12 DIAGNOSIS — I4891 Unspecified atrial fibrillation: Secondary | ICD-10-CM

## 2011-12-12 LAB — BASIC METABOLIC PANEL
BUN: 25 mg/dL — ABNORMAL HIGH (ref 6–23)
Chloride: 107 mEq/L (ref 96–112)
GFR: 36.11 mL/min — ABNORMAL LOW (ref >60.00)
Potassium: 4.9 mEq/L (ref 3.5–5.1)
Sodium: 142 mEq/L (ref 135–145)

## 2011-12-12 LAB — CBC WITH DIFFERENTIAL/PLATELET
Basophils Relative: 0.6 % (ref 0.0–3.0)
Eosinophils Absolute: 0.1 10*3/uL (ref 0.0–0.7)
HCT: 46.5 % (ref 39.0–52.0)
Lymphs Abs: 2.1 10*3/uL (ref 0.7–4.0)
MCHC: 33.7 g/dL (ref 30.0–36.0)
MCV: 93.5 fl (ref 78.0–100.0)
Monocytes Absolute: 0.7 10*3/uL (ref 0.1–1.0)
Neutrophils Relative %: 49.7 % (ref 43.0–77.0)
Platelets: 155 10*3/uL (ref 150.0–400.0)

## 2011-12-12 LAB — POCT URINALYSIS DIPSTICK
Glucose, UA: NEGATIVE
Leukocytes, UA: NEGATIVE
Nitrite, UA: NEGATIVE
Protein, UA: NEGATIVE
Spec Grav, UA: 1.025
Urobilinogen, UA: 0.2

## 2011-12-12 LAB — HEPATIC FUNCTION PANEL
ALT: 22 U/L (ref 0–53)
Bilirubin, Direct: 0.1 mg/dL (ref 0.0–0.3)
Total Protein: 6.1 g/dL (ref 6.0–8.3)

## 2011-12-12 LAB — LIPID PANEL
Cholesterol: 182 mg/dL (ref 0–200)
Total CHOL/HDL Ratio: 5
Triglycerides: 216 mg/dL — ABNORMAL HIGH (ref 0.0–149.0)

## 2011-12-12 LAB — TSH: TSH: 2.52 u[IU]/mL (ref 0.35–5.50)

## 2011-12-12 LAB — PSA: PSA: 2.69 ng/mL (ref 0.10–4.00)

## 2011-12-12 NOTE — Patient Instructions (Signed)
  Latest dosing instructions   Total Sun Mon Tue Wed Thu Fri Sat   30 2.5 mg 5 mg 5 mg 2.5 mg 5 mg 5 mg 5 mg    (5 mg0.5) (5 mg1) (5 mg1) (5 mg0.5) (5 mg1) (5 mg1) (5 mg1)        

## 2011-12-16 NOTE — Progress Notes (Signed)
Quick Note:  I spoke with pt ______ 

## 2011-12-19 ENCOUNTER — Ambulatory Visit (INDEPENDENT_AMBULATORY_CARE_PROVIDER_SITE_OTHER): Payer: Medicare Other | Admitting: Family Medicine

## 2011-12-19 ENCOUNTER — Encounter: Payer: Self-pay | Admitting: Family Medicine

## 2011-12-19 VITALS — BP 114/70 | HR 100 | Temp 97.9°F | Ht 70.5 in | Wt 200.0 lb

## 2011-12-19 DIAGNOSIS — Z0000 Encounter for general adult medical examination without abnormal findings: Secondary | ICD-10-CM

## 2011-12-19 NOTE — Progress Notes (Signed)
  Subjective:    Patient ID: Justin Gallegos, male    DOB: 17-Sep-1928, 78 y.o.   MRN: [id number]  HPI 78 yr old male for a cpx. He feels great and has no complaints. He is never aware of his atrial fibrillation and he remains quite active.    Review of Systems  Constitutional: Negative.   HENT: Negative.   Eyes: Negative.   Respiratory: Negative.   Cardiovascular: Negative.   Gastrointestinal: Negative.   Genitourinary: Negative.   Musculoskeletal: Negative.   Skin: Negative.   Neurological: Negative.   Hematological: Negative.   Psychiatric/Behavioral: Negative.        Objective:   Physical Exam  Constitutional: He is oriented to person, place, and time. He appears well-developed and well-nourished. No distress.  HENT:  Head: Normocephalic and atraumatic.  Right Ear: External ear normal.  Left Ear: External ear normal.  Nose: Nose normal.  Mouth/Throat: Oropharynx is clear and moist. No oropharyngeal exudate.  Eyes: Conjunctivae and EOM are normal. Pupils are equal, round, and reactive to light. Right eye exhibits no discharge. Left eye exhibits no discharge. No scleral icterus.  Neck: Neck supple. No JVD present. No tracheal deviation present. No thyromegaly present.  Cardiovascular: Normal rate, regular rhythm, normal heart sounds and intact distal pulses.  Exam reveals no gallop and no friction rub.   No murmur heard.      EKG shows his typical atrial fibrillation with good rate control   Pulmonary/Chest: Effort normal and breath sounds normal. No respiratory distress. He has no wheezes. He has no rales. He exhibits no tenderness.  Abdominal: Soft. Bowel sounds are normal. He exhibits no distension and no mass. There is no tenderness. There is no rebound and no guarding.  Genitourinary: Rectum normal, prostate normal and penis normal. Guaiac negative stool. No penile tenderness.  Musculoskeletal: Normal range of motion. He exhibits no edema and no tenderness.   Lymphadenopathy:    He has no cervical adenopathy.  Neurological: He is alert and oriented to person, place, and time. He has normal reflexes. No cranial nerve deficit. He exhibits normal muscle tone. Coordination normal.  Skin: Skin is warm and dry. No rash noted. He is not diaphoretic. No erythema. No pallor.  Psychiatric: He has a normal mood and affect. His behavior is normal. Judgment and thought content normal.          Assessment & Plan:  Well exam.

## 2012-01-10 ENCOUNTER — Ambulatory Visit (INDEPENDENT_AMBULATORY_CARE_PROVIDER_SITE_OTHER): Payer: Medicare Other | Admitting: Family

## 2012-01-10 DIAGNOSIS — I4891 Unspecified atrial fibrillation: Secondary | ICD-10-CM

## 2012-01-10 LAB — POCT INR: INR: 3.4

## 2012-01-10 NOTE — Patient Instructions (Signed)
  Latest dosing instructions   Total Sun Mon Tue Wed Thu Fri Sat   30 2.5 mg 5 mg 5 mg 2.5 mg 5 mg 5 mg 5 mg    (5 mg0.5) (5 mg1) (5 mg1) (5 mg0.5) (5 mg1) (5 mg1) (5 mg1)      Hold todays dose!

## 2012-01-29 LAB — POCT INR: INR: 0

## 2012-01-31 ENCOUNTER — Ambulatory Visit (INDEPENDENT_AMBULATORY_CARE_PROVIDER_SITE_OTHER): Payer: Medicare Other | Admitting: Family

## 2012-01-31 DIAGNOSIS — I4891 Unspecified atrial fibrillation: Secondary | ICD-10-CM

## 2012-01-31 LAB — POCT INR: INR: 2.3

## 2012-01-31 NOTE — Patient Instructions (Addendum)
Continue the same Dose, 2.5 mg on wednesdays and sundays 5 mg on other days, check in 4 weeks    Latest dosing instructions   Total Sun Mon Tue Wed Thu Fri Sat   30 2.5 mg 5 mg 5 mg 2.5 mg 5 mg 5 mg 5 mg    (5 mg0.5) (5 mg1) (5 mg1) (5 mg0.5) (5 mg1) (5 mg1) (5 mg1)

## 2012-02-28 ENCOUNTER — Ambulatory Visit (INDEPENDENT_AMBULATORY_CARE_PROVIDER_SITE_OTHER): Payer: Medicare Other | Admitting: Family

## 2012-02-28 DIAGNOSIS — I4891 Unspecified atrial fibrillation: Secondary | ICD-10-CM

## 2012-02-28 LAB — POCT INR: INR: 3.3

## 2012-02-28 NOTE — Patient Instructions (Signed)
Hold Coumadin today. Continue the same Dose, 2.5 mg on wednesdays and sundays 5 mg on other days, check in 4 weeks    Latest dosing instructions   Total Sun Mon Tue Wed Thu Fri Sat   30 2.5 mg 5 mg 5 mg 2.5 mg 5 mg 5 mg 5 mg    (5 mg0.5) (5 mg1) (5 mg1) (5 mg0.5) (5 mg1) (5 mg1) (5 mg1)

## 2012-03-24 ENCOUNTER — Ambulatory Visit (INDEPENDENT_AMBULATORY_CARE_PROVIDER_SITE_OTHER): Payer: Medicare Other | Admitting: Family

## 2012-03-24 DIAGNOSIS — I4891 Unspecified atrial fibrillation: Secondary | ICD-10-CM

## 2012-03-24 LAB — POCT INR: INR: 3.9

## 2012-03-24 NOTE — Patient Instructions (Addendum)
  Hold Coumadin today and tomorrow.  Monday, Wednesday, and Friday 2.5mg  and 5mg  all other days.    Latest dosing instructions   Total Sun Mon Tue Wed Thu Fri Sat   27.5 5 mg 2.5 mg 5 mg 2.5 mg 5 mg 2.5 mg 5 mg    (5 mg1) (5 mg0.5) (5 mg1) (5 mg0.5) (5 mg1) (5 mg0.5) (5 mg1)

## 2012-03-25 ENCOUNTER — Other Ambulatory Visit: Payer: Self-pay | Admitting: Internal Medicine

## 2012-04-01 ENCOUNTER — Other Ambulatory Visit: Payer: Self-pay | Admitting: Family Medicine

## 2012-04-08 ENCOUNTER — Ambulatory Visit (INDEPENDENT_AMBULATORY_CARE_PROVIDER_SITE_OTHER): Payer: Medicare Other | Admitting: Family

## 2012-04-08 ENCOUNTER — Telehealth: Payer: Self-pay | Admitting: Family Medicine

## 2012-04-08 DIAGNOSIS — I4891 Unspecified atrial fibrillation: Secondary | ICD-10-CM

## 2012-04-08 LAB — POCT INR: INR: 3.1

## 2012-04-08 NOTE — Telephone Encounter (Signed)
Patient is requesting a refill on Flonase Nasal Spray.  Pharmacy is [facility] in [place]

## 2012-04-08 NOTE — Patient Instructions (Signed)
Hold coumadin today only.  Monday, Wednesday, and Friday 2.5mg  and 5mg  all other days. Recheck in 4 weeks.    Latest dosing instructions   Total Sun Mon Tue Wed Thu Fri Sat   27.5 5 mg 2.5 mg 5 mg 2.5 mg 5 mg 2.5 mg 5 mg    (5 mg1) (5 mg0.5) (5 mg1) (5 mg0.5) (5 mg1) (5 mg0.5) (5 mg1)

## 2012-04-10 MED ORDER — FLUTICASONE PROPIONATE 50 MCG/ACT NA SUSP: 2.0000 | NASAL | Status: DC | PRN

## 2012-04-10 NOTE — Telephone Encounter (Signed)
I sent script e-scribe. 

## 2012-04-24 ENCOUNTER — Other Ambulatory Visit: Payer: Self-pay | Admitting: Family Medicine

## 2012-05-06 ENCOUNTER — Ambulatory Visit (INDEPENDENT_AMBULATORY_CARE_PROVIDER_SITE_OTHER): Payer: Medicare Other | Admitting: Family

## 2012-05-06 DIAGNOSIS — I4891 Unspecified atrial fibrillation: Secondary | ICD-10-CM

## 2012-05-06 NOTE — Patient Instructions (Signed)
Hold coumadin today only.  Monday, Wednesday, and Friday 2.5mg and 5mg all other days. Recheck in 4 weeks.    Latest dosing instructions   Total Sun Mon Tue Wed Thu Fri Sat   27.5 5 mg 2.5 mg 5 mg 2.5 mg 5 mg 2.5 mg 5 mg    (5 mg1) (5 mg0.5) (5 mg1) (5 mg0.5) (5 mg1) (5 mg0.5) (5 mg1)        

## 2012-06-03 ENCOUNTER — Ambulatory Visit (INDEPENDENT_AMBULATORY_CARE_PROVIDER_SITE_OTHER): Payer: Medicare Other | Admitting: Family

## 2012-06-03 DIAGNOSIS — I4891 Unspecified atrial fibrillation: Secondary | ICD-10-CM

## 2012-06-03 LAB — POCT INR: INR: 2.8

## 2012-06-03 NOTE — Patient Instructions (Signed)
Monday, Wednesday, and Friday 2.5mg  and 5mg  all other days. Recheck in 4 weeks.     Latest dosing instructions   Total Sun Mon Tue Wed Thu Fri Sat   27.5 5 mg 2.5 mg 5 mg 2.5 mg 5 mg 2.5 mg 5 mg    (5 mg1) (5 mg0.5) (5 mg1) (5 mg0.5) (5 mg1) (5 mg0.5) (5 mg1)

## 2012-06-23 ENCOUNTER — Other Ambulatory Visit: Payer: Self-pay | Admitting: Internal Medicine

## 2012-06-24 ENCOUNTER — Other Ambulatory Visit: Payer: Self-pay | Admitting: Cardiology

## 2012-06-24 MED ORDER — METOPROLOL SUCCINATE ER 25 MG PO TB24: ORAL_TABLET | ORAL | Status: DC

## 2012-06-25 NOTE — Telephone Encounter (Signed)
Fax Received. Refill Completed. Janaysia Mcleroy Chowoe (R.M.A)   

## 2012-07-01 ENCOUNTER — Ambulatory Visit (INDEPENDENT_AMBULATORY_CARE_PROVIDER_SITE_OTHER): Payer: Medicare Other | Admitting: Family

## 2012-07-01 DIAGNOSIS — I4891 Unspecified atrial fibrillation: Secondary | ICD-10-CM

## 2012-07-01 NOTE — Patient Instructions (Addendum)
Hold Coumadin today only. Monday, Wednesday, and Friday 5mg .  All other days, 2.5mg . Recheck in 4 weeks.    Latest dosing instructions   Total [name] Tue Wed Thu Fri Sat   25 2.5 mg 5 mg 2.5 mg 5 mg 2.5 mg 5 mg 2.5 mg    (5 mg0.5) (5 mg1) (5 mg0.5) (5 mg1) (5 mg0.5) (5 mg1) (5 mg0.5)

## 2012-07-24 ENCOUNTER — Encounter: Payer: Self-pay | Admitting: Internal Medicine

## 2012-07-24 ENCOUNTER — Ambulatory Visit (INDEPENDENT_AMBULATORY_CARE_PROVIDER_SITE_OTHER): Payer: Medicare Other | Admitting: Internal Medicine

## 2012-07-24 VITALS — BP 136/81 | HR 89 | Ht 70.0 in | Wt 202.0 lb

## 2012-07-24 DIAGNOSIS — I4891 Unspecified atrial fibrillation: Secondary | ICD-10-CM

## 2012-07-24 DIAGNOSIS — R0989 Other specified symptoms and signs involving the circulatory and respiratory systems: Secondary | ICD-10-CM

## 2012-07-24 MED ORDER — METOPROLOL SUCCINATE ER 25 MG PO TB24: ORAL_TABLET | ORAL | Status: DC

## 2012-07-24 MED ORDER — ATORVASTATIN CALCIUM 40 MG PO TABS: 40.0000 mg | ORAL_TABLET | Freq: Every day | ORAL | Status: DC

## 2012-07-24 NOTE — Patient Instructions (Addendum)
STOP Simvastatin  START Lipitor 40mg  every evening  Fasting lab work at [facility] office in 2 months  Your physician has requested that you have a carotid duplex. This test is an ultrasound of the carotid arteries in your neck. It looks at blood flow through these arteries that supply the brain with blood. Allow one hour for this exam. There are no restrictions or special instructions.  Your physician wants you to follow-up in: 12 months You will receive a reminder letter in the mail two months in advance. If you don't receive a letter, please call our office to schedule the follow-up appointment.

## 2012-07-24 NOTE — Progress Notes (Addendum)
HPI Patinet is an 78 yo with a history of afib and hyperlipidemia  I saw him in clinic in Dec 2012 Last lipids in May LDL was 122,  Trig were 216 Since seen denies palpitations.   Does get tired with activity but no signif change No CP NO dizziness. Allergies  Allergen Reactions  . Amoxicillin-Pot Clavulanate     REACTION: throat swelling    Current Outpatient Prescriptions  Medication Sig Dispense Refill  . B Complex Vitamins (VITAMIN B COMPLEX PO) Take by mouth. TAKE 1 TAB DAILY      . fenofibrate 160 MG tablet TAKE ONE TABLET BY MOUTH EVERY DAY  30 tablet  11  . fish oil-omega-3 fatty acids 1000 MG capsule Take 1 g by mouth daily.        . fluticasone (FLONASE) 50 MCG/ACT nasal spray Place 2 sprays into the nose as needed.  16 g  11  . metoprolol succinate (TOPROL-XL) 25 MG 24 hr tablet       . simvastatin (ZOCOR) 40 MG tablet TAKE ONE TABLET BY MOUTH EVERY DAY  30 tablet  5  . warfarin (COUMADIN) 5 MG tablet AS DIRECTED        Past Medical History  Diagnosis Date  . Hyperlipidemia   . Hypertension   . Arthritis   . Low back pain   . TIA (transient ischemic attack)   . Atrial fibrillation     sees Dr. Dietrich Pates   . Hemorrhoids   . Cancer     ureteral, per Dr. Laverle Patter     Past Surgical History  Procedure Date  . Appendectomy   . Tonsillectomy   . Hernia repair   . Nephrectomy   . Ureterectomy   . Cataract extraction, bilateral 2013    per Dr. Dione Booze   . Colonoscopy 06-28-10    per Dr. Leone Payor, benign polyps and severe diverticulosis, no repeats needed     Family History  Problem Relation Age of Onset  . Coronary artery disease      fhx  . Stroke      fhx    History   Social History  . Marital Status: Married    Spouse Name: N/A    Number of Children: N/A  . Years of Education: N/A   Occupational History  . Not on file.   Social History Main Topics  . Smoking status: Never Smoker   . Smokeless tobacco: Never Used  . Alcohol Use: No  . Drug  Use: No  . Sexually Active: Not on file   Other Topics Concern  . Not on file   Social History Narrative  . No narrative on file    Review of Systems:  All systems reviewed.  They are negative to the above problem except as previously stated.  Vital Signs: Ht 5\' 10"  (1.778 m)  Wt 202 lb (91.627 kg)  BMI 28.98 kg/m2 BP 136/81  P 89 Physical Exam Patient is in NAD HEENT:  Normocephalic, atraumatic. EOMI, PERRLA.  Neck: JVP is normal.  R carotid brut  Lungs: clear to auscultation. No rales no wheezes.  Heart: Regular rate and rhythm. Normal S1, S2. No S3.   No significant murmurs. PMI not displaced.  Abdomen:  Supple, nontender. Normal bowel sounds. No masses. No hepatomegaly.  Extremities:   Good distal pulses throughout. No lower extremity edema.  Musculoskeletal :moving all extremities.  Neuro:   alert and oriented x3.  CN II-XII grossly intact.  EKG  [phone]  Assessment and Plan:  1.  Atrial fibrillation.  Continue rate control and coumadin.  2  Dyspneal  I am not convinced this represents angina.  Follow  3.  HTN  BP is good.    4.  HL LDL was high on last check  Will switch to lipitor 40  F/U with panel  5.  Carotid bruit  Set up for USN  F/U in 12 months. Stay active.

## 2012-07-29 ENCOUNTER — Ambulatory Visit (INDEPENDENT_AMBULATORY_CARE_PROVIDER_SITE_OTHER): Payer: Medicare Other | Admitting: Family

## 2012-07-29 DIAGNOSIS — I4891 Unspecified atrial fibrillation: Secondary | ICD-10-CM

## 2012-08-11 ENCOUNTER — Encounter (INDEPENDENT_AMBULATORY_CARE_PROVIDER_SITE_OTHER): Payer: Medicare Other

## 2012-08-11 DIAGNOSIS — I6529 Occlusion and stenosis of unspecified carotid artery: Secondary | ICD-10-CM

## 2012-08-11 DIAGNOSIS — R0989 Other specified symptoms and signs involving the circulatory and respiratory systems: Secondary | ICD-10-CM

## 2012-08-26 ENCOUNTER — Ambulatory Visit (INDEPENDENT_AMBULATORY_CARE_PROVIDER_SITE_OTHER): Payer: Medicare Other | Admitting: Family

## 2012-08-26 DIAGNOSIS — I4891 Unspecified atrial fibrillation: Secondary | ICD-10-CM

## 2012-08-26 LAB — POCT INR: INR: 4.4

## 2012-08-26 NOTE — Patient Instructions (Addendum)
Hold Coumadin for 2 days. Then resume at 1 tab Monday and Wed and 0.5 all other days. Recheck in 2  Weeks    Latest dosing instructions   Total Sun Mon Tue Wed Thu Fri Sat   22.5 2.5 mg 5 mg 2.5 mg 5 mg 2.5 mg 2.5 mg 2.5 mg    (5 mg0.5) (5 mg1) (5 mg0.5) (5 mg1) (5 mg0.5) (5 mg0.5) (5 mg0.5)

## 2012-09-08 ENCOUNTER — Ambulatory Visit (INDEPENDENT_AMBULATORY_CARE_PROVIDER_SITE_OTHER): Payer: Medicare Other | Admitting: Family

## 2012-09-08 DIAGNOSIS — I4891 Unspecified atrial fibrillation: Secondary | ICD-10-CM

## 2012-09-08 NOTE — Patient Instructions (Signed)
1 tab Monday and Wed and 0.5 all other days. Recheck in 3  Weeks    Latest dosing instructions   Total Sun Mon Tue Wed Thu Fri Sat   22.5 2.5 mg 5 mg 2.5 mg 5 mg 2.5 mg 2.5 mg 2.5 mg    (5 mg0.5) (5 mg1) (5 mg0.5) (5 mg1) (5 mg0.5) (5 mg0.5) (5 mg0.5)

## 2012-09-09 ENCOUNTER — Encounter: Payer: Medicare Other | Admitting: Family

## 2012-09-24 ENCOUNTER — Other Ambulatory Visit: Payer: Medicare Other

## 2012-09-24 ENCOUNTER — Encounter: Payer: Medicare Other | Admitting: Family

## 2012-09-30 ENCOUNTER — Other Ambulatory Visit: Payer: Self-pay | Admitting: *Deleted

## 2012-09-30 ENCOUNTER — Encounter: Payer: Medicare Other | Admitting: Family

## 2012-09-30 ENCOUNTER — Other Ambulatory Visit (INDEPENDENT_AMBULATORY_CARE_PROVIDER_SITE_OTHER): Payer: Medicare Other

## 2012-09-30 DIAGNOSIS — E785 Hyperlipidemia, unspecified: Secondary | ICD-10-CM

## 2012-09-30 DIAGNOSIS — I10 Essential (primary) hypertension: Secondary | ICD-10-CM

## 2012-09-30 DIAGNOSIS — I4891 Unspecified atrial fibrillation: Secondary | ICD-10-CM

## 2012-09-30 DIAGNOSIS — R351 Nocturia: Secondary | ICD-10-CM

## 2012-09-30 LAB — POCT INR: INR: 2

## 2012-09-30 LAB — CBC WITH DIFFERENTIAL/PLATELET
Basophils Absolute: 0 10*3/uL (ref 0.0–0.1)
HCT: 48.3 % (ref 39.0–52.0)
Lymphs Abs: 2.1 10*3/uL (ref 0.7–4.0)
Monocytes Relative: 13.1 % — ABNORMAL HIGH (ref 3.0–12.0)
Neutrophils Relative %: 49.9 % (ref 43.0–77.0)
Platelets: 167 10*3/uL (ref 150.0–400.0)
RDW: 14.1 % (ref 11.5–14.6)

## 2012-09-30 LAB — LIPID PANEL
HDL: 31.6 mg/dL — ABNORMAL LOW (ref >39.00)
Total CHOL/HDL Ratio: 5
VLDL: 38.2 mg/dL (ref 0.0–40.0)

## 2012-09-30 LAB — BASIC METABOLIC PANEL
CO2: 26 mEq/L (ref 19–32)
Calcium: 9.6 mg/dL (ref 8.4–10.5)
GFR: 35.82 mL/min — ABNORMAL LOW (ref >60.00)
Potassium: 5.2 mEq/L — ABNORMAL HIGH (ref 3.5–5.1)
Sodium: 140 mEq/L (ref 135–145)

## 2012-09-30 LAB — HEPATIC FUNCTION PANEL
Alkaline Phosphatase: 55 U/L (ref 39–117)
Bilirubin, Direct: 0.2 mg/dL (ref 0.0–0.3)

## 2012-09-30 LAB — TSH: TSH: 2.66 u[IU]/mL (ref 0.35–5.50)

## 2012-10-01 ENCOUNTER — Ambulatory Visit (INDEPENDENT_AMBULATORY_CARE_PROVIDER_SITE_OTHER): Payer: Medicare Other | Admitting: Family

## 2012-10-01 ENCOUNTER — Telehealth: Payer: Self-pay | Admitting: Family

## 2012-10-01 DIAGNOSIS — I4891 Unspecified atrial fibrillation: Secondary | ICD-10-CM

## 2012-10-01 LAB — POCT INR: INR: 2

## 2012-10-01 NOTE — Telephone Encounter (Signed)
Pt aware to continue same dose and appt scheduled for 4 wks

## 2012-10-01 NOTE — Patient Instructions (Signed)
1 tab Monday and Wed and 0.5 all other days. Recheck in 4 weeks  Anticoagulation Dose Instructions as of 10/01/2012     [name] Tue Wed Thu Fri Sat   New Dose 2.5 mg 5 mg 2.5 mg 5 mg 2.5 mg 2.5 mg 2.5 mg    Description       1 tab Monday and Wed and 0.5 all other days. Recheck in 4 weeks

## 2012-10-01 NOTE — Telephone Encounter (Signed)
Pt has some questions for you: Pt wants to know if he should stay on same dosage for the next month? When do you want pt to come back? Pls call him on his cell: [phone]

## 2012-10-20 ENCOUNTER — Other Ambulatory Visit: Payer: Self-pay | Admitting: *Deleted

## 2012-10-20 DIAGNOSIS — I10 Essential (primary) hypertension: Secondary | ICD-10-CM

## 2012-10-21 ENCOUNTER — Other Ambulatory Visit: Payer: Self-pay

## 2012-10-21 MED ORDER — WARFARIN SODIUM 5 MG PO TABS: 5.0000 mg | ORAL_TABLET | Freq: Every day | ORAL | Status: DC

## 2012-10-27 ENCOUNTER — Encounter: Payer: Medicare Other | Admitting: Family

## 2012-10-29 ENCOUNTER — Other Ambulatory Visit: Payer: Self-pay | Admitting: Family

## 2012-10-29 ENCOUNTER — Ambulatory Visit (INDEPENDENT_AMBULATORY_CARE_PROVIDER_SITE_OTHER): Payer: Medicare Other | Admitting: Family

## 2012-10-29 DIAGNOSIS — I10 Essential (primary) hypertension: Secondary | ICD-10-CM

## 2012-10-29 DIAGNOSIS — I4891 Unspecified atrial fibrillation: Secondary | ICD-10-CM

## 2012-10-29 LAB — BASIC METABOLIC PANEL
BUN: 30 mg/dL — ABNORMAL HIGH (ref 6–23)
CO2: 25 mEq/L (ref 19–32)
Calcium: 8.6 mg/dL (ref 8.4–10.5)
Chloride: 106 mEq/L (ref 96–112)
Creatinine, Ser: 2.1 mg/dL — ABNORMAL HIGH (ref 0.4–1.5)

## 2012-10-29 NOTE — Patient Instructions (Addendum)
Take an extra 1/2 tab today and tomorrow. Then continue 1 tab Monday and Wed and 0.5 all other days. Recheck in 3 weeks  Anticoagulation Dose Instructions as of 10/29/2012     [name] Tue Wed Thu Fri Sat   New Dose 2.5 mg 5 mg 2.5 mg 5 mg 2.5 mg 2.5 mg 2.5 mg    Description       Take an extra 1/2 tab today and tomorrow. Then continue 1 tab Monday and Wed and 0.5 all other days. Recheck in 3 weeks

## 2012-11-17 ENCOUNTER — Other Ambulatory Visit: Payer: Self-pay | Admitting: Family Medicine

## 2012-11-18 ENCOUNTER — Ambulatory Visit (INDEPENDENT_AMBULATORY_CARE_PROVIDER_SITE_OTHER): Payer: Medicare Other | Admitting: Family

## 2012-11-18 DIAGNOSIS — I4891 Unspecified atrial fibrillation: Secondary | ICD-10-CM

## 2012-11-18 LAB — POCT INR: INR: 2

## 2012-11-18 NOTE — Patient Instructions (Addendum)
Continue 1 tab Monday and Wed and 0.5 all other days. Recheck in 4  Weeks  Anticoagulation Dose Instructions as of 11/18/2012     Justin Gallegos Tue Wed Thu Fri Sat   New Dose 2.5 mg 5 mg 2.5 mg 5 mg 2.5 mg 2.5 mg 2.5 mg    Description       Continue 1 tab Monday and Wed and 0.5 all other days. Recheck in 4  weeks

## 2012-11-19 ENCOUNTER — Encounter: Payer: Medicare Other | Admitting: Family

## 2012-12-16 ENCOUNTER — Ambulatory Visit (INDEPENDENT_AMBULATORY_CARE_PROVIDER_SITE_OTHER): Payer: Medicare Other | Admitting: Family

## 2012-12-16 DIAGNOSIS — I4891 Unspecified atrial fibrillation: Secondary | ICD-10-CM

## 2012-12-16 NOTE — Patient Instructions (Addendum)
Continue 1 tab Monday and Wed and 0.5 all other days. Recheck in 4  Weeks  Anticoagulation Dose Instructions as of 12/16/2012     Justin Gallegos Tue Wed Thu Fri Sat   New Dose 2.5 mg 5 mg 2.5 mg 5 mg 2.5 mg 2.5 mg 2.5 mg    Description       Continue 1 tab Monday and Wed and 0.5 all other days. Recheck in 4  weeks

## 2013-01-11 ENCOUNTER — Ambulatory Visit (INDEPENDENT_AMBULATORY_CARE_PROVIDER_SITE_OTHER): Payer: Medicare Other | Admitting: Family

## 2013-01-11 DIAGNOSIS — I4891 Unspecified atrial fibrillation: Secondary | ICD-10-CM

## 2013-01-11 NOTE — Patient Instructions (Addendum)
Continue 1 tab Monday and Wed and 0.5 all other days. Recheck in 6  Weeks  Anticoagulation Dose Instructions as of 01/11/2013     [name] Tue Wed Thu Fri Sat   New Dose 2.5 mg 5 mg 2.5 mg 5 mg 2.5 mg 2.5 mg 2.5 mg    Description       Continue 1 tab Monday and Wed and 0.5 all other days. Recheck in 6  weeks

## 2013-01-18 ENCOUNTER — Other Ambulatory Visit: Payer: Self-pay | Admitting: Family Medicine

## 2013-01-18 NOTE — Telephone Encounter (Signed)
Can we refill this? 

## 2013-02-03 ENCOUNTER — Other Ambulatory Visit (INDEPENDENT_AMBULATORY_CARE_PROVIDER_SITE_OTHER): Payer: Medicare Other

## 2013-02-03 DIAGNOSIS — I10 Essential (primary) hypertension: Secondary | ICD-10-CM

## 2013-02-03 DIAGNOSIS — E785 Hyperlipidemia, unspecified: Secondary | ICD-10-CM

## 2013-02-03 DIAGNOSIS — Z0000 Encounter for general adult medical examination without abnormal findings: Secondary | ICD-10-CM

## 2013-02-03 LAB — POCT URINALYSIS DIPSTICK
Glucose, UA: NEGATIVE
Nitrite, UA: NEGATIVE
Spec Grav, UA: 1.015
Urobilinogen, UA: 0.2

## 2013-02-03 LAB — CBC WITH DIFFERENTIAL/PLATELET
Basophils Relative: 0.7 % (ref 0.0–3.0)
Eosinophils Absolute: 0.1 10*3/uL (ref 0.0–0.7)
HCT: 47.3 % (ref 39.0–52.0)
Hemoglobin: 16 g/dL (ref 13.0–17.0)
Lymphocytes Relative: 34.4 % (ref 12.0–46.0)
Lymphs Abs: 2 10*3/uL (ref 0.7–4.0)
MCHC: 33.8 g/dL (ref 30.0–36.0)
MCV: 94.3 fl (ref 78.0–100.0)
Monocytes Absolute: 0.8 10*3/uL (ref 0.1–1.0)
Neutro Abs: 2.8 10*3/uL (ref 1.4–7.7)
RBC: 5.02 Mil/uL (ref 4.22–5.81)

## 2013-02-03 LAB — TSH: TSH: 2.18 u[IU]/mL (ref 0.35–5.50)

## 2013-02-03 LAB — BASIC METABOLIC PANEL
BUN: 22 mg/dL (ref 6–23)
Calcium: 9.1 mg/dL (ref 8.4–10.5)
Chloride: 108 mEq/L (ref 96–112)
Creatinine, Ser: 1.8 mg/dL — ABNORMAL HIGH (ref 0.4–1.5)

## 2013-02-03 LAB — HEPATIC FUNCTION PANEL
ALT: 20 U/L (ref 0–53)
AST: 27 U/L (ref 0–37)
Bilirubin, Direct: 0.1 mg/dL (ref 0.0–0.3)
Total Bilirubin: 0.8 mg/dL (ref 0.3–1.2)

## 2013-02-03 LAB — PSA: PSA: 2.01 ng/mL (ref 0.10–4.00)

## 2013-02-03 LAB — LIPID PANEL
Cholesterol: 173 mg/dL (ref 0–200)
LDL Cholesterol: 101 mg/dL — ABNORMAL HIGH (ref 0–99)

## 2013-02-04 NOTE — Progress Notes (Signed)
Quick Note:  I spoke with pt ______ 

## 2013-02-10 ENCOUNTER — Encounter: Payer: Self-pay | Admitting: Family Medicine

## 2013-02-10 ENCOUNTER — Ambulatory Visit (INDEPENDENT_AMBULATORY_CARE_PROVIDER_SITE_OTHER): Payer: Medicare Other | Admitting: Family Medicine

## 2013-02-10 ENCOUNTER — Ambulatory Visit (INDEPENDENT_AMBULATORY_CARE_PROVIDER_SITE_OTHER)
Admission: RE | Admit: 2013-02-10 | Discharge: 2013-02-10 | Disposition: A | Discharge status: Home / Self Care | Payer: Medicare Other | Source: Ambulatory Visit | Attending: Family Medicine | Admitting: Family Medicine

## 2013-02-10 VITALS — BP 118/70 | HR 91 | Temp 98.3°F | Ht 70.5 in | Wt 199.0 lb

## 2013-02-10 DIAGNOSIS — Z0000 Encounter for general adult medical examination without abnormal findings: Secondary | ICD-10-CM

## 2013-02-10 DIAGNOSIS — R05 Cough: Secondary | ICD-10-CM

## 2013-02-10 MED ORDER — ATORVASTATIN CALCIUM 40 MG PO TABS: 40.0000 mg | ORAL_TABLET | Freq: Every day | ORAL | Status: DC

## 2013-02-10 MED ORDER — METOPROLOL SUCCINATE ER 25 MG PO TB24: ORAL_TABLET | ORAL | Status: DC

## 2013-02-10 MED ORDER — FLUTICASONE PROPIONATE 50 MCG/ACT NA SUSP: 2.0000 | NASAL | Status: DC | PRN

## 2013-02-10 MED ORDER — FENOFIBRATE 160 MG PO TABS: 160.0000 mg | ORAL_TABLET | Freq: Every day | ORAL | Status: DC

## 2013-02-10 NOTE — Progress Notes (Signed)
  Subjective:    Patient ID: Justin Gallegos, male    DOB: May 02, 1929, 79 y.o.   MRN: [id number]  HPI 79 yr old male for a cpx. He feels well. He mentions a frequent dry cough at night over the past few months. No SOB or wheezing or chest pain.   Review of Systems  Constitutional: Negative.   HENT: Negative.   Eyes: Negative.   Respiratory: Positive for cough. Negative for apnea, choking, chest tightness, shortness of breath, wheezing and stridor.   Cardiovascular: Negative.   Gastrointestinal: Negative.   Genitourinary: Negative.   Musculoskeletal: Negative.   Skin: Negative.   Neurological: Negative.   Psychiatric/Behavioral: Negative.        Objective:   Physical Exam  Constitutional: He is oriented to person, place, and time. He appears well-developed and well-nourished. No distress.  HENT:  Head: Normocephalic and atraumatic.  Right Ear: External ear normal.  Left Ear: External ear normal.  Nose: Nose normal.  Mouth/Throat: Oropharynx is clear and moist. No oropharyngeal exudate.  Eyes: Conjunctivae and EOM are normal. Pupils are equal, round, and reactive to light. Right eye exhibits no discharge. Left eye exhibits no discharge. No scleral icterus.  Neck: Neck supple. No JVD present. No tracheal deviation present. No thyromegaly present.  Cardiovascular: Normal rate, regular rhythm, normal heart sounds and intact distal pulses.  Exam reveals no gallop and no friction rub.   No murmur heard. Pulmonary/Chest: Effort normal and breath sounds normal. No respiratory distress. He has no wheezes. He has no rales. He exhibits no tenderness.  Abdominal: Soft. Bowel sounds are normal. He exhibits no distension and no mass. There is no tenderness. There is no rebound and no guarding.  Genitourinary: Rectum normal, prostate normal and penis normal. Guaiac negative stool. No penile tenderness.  Musculoskeletal: Normal range of motion. He exhibits no edema and no tenderness.   Lymphadenopathy:    He has no cervical adenopathy.  Neurological: He is alert and oriented to person, place, and time. He has normal reflexes. No cranial nerve deficit. He exhibits normal muscle tone. Coordination normal.  Skin: Skin is warm and dry. No rash noted. He is not diaphoretic. No erythema. No pallor.  Psychiatric: He has a normal mood and affect. His behavior is normal. Judgment and thought content normal.          Assessment & Plan:  Well exam. Get a CXR today.

## 2013-02-15 NOTE — Progress Notes (Signed)
Quick Note:  I spoke with pt ______ 

## 2013-03-01 ENCOUNTER — Ambulatory Visit: Payer: Medicare Other

## 2013-03-01 ENCOUNTER — Ambulatory Visit (INDEPENDENT_AMBULATORY_CARE_PROVIDER_SITE_OTHER): Payer: Medicare Other | Admitting: General Practice

## 2013-03-01 ENCOUNTER — Encounter: Payer: Medicare Other | Admitting: Family

## 2013-03-01 DIAGNOSIS — I4891 Unspecified atrial fibrillation: Secondary | ICD-10-CM

## 2013-03-01 LAB — POCT INR: INR: 2

## 2013-04-07 ENCOUNTER — Encounter: Payer: Self-pay | Admitting: Family Medicine

## 2013-04-07 ENCOUNTER — Ambulatory Visit (INDEPENDENT_AMBULATORY_CARE_PROVIDER_SITE_OTHER): Payer: Medicare Other | Admitting: Family Medicine

## 2013-04-07 VITALS — BP 120/72 | HR 92 | Wt 200.0 lb

## 2013-04-07 DIAGNOSIS — R5381 Other malaise: Secondary | ICD-10-CM

## 2013-04-07 DIAGNOSIS — R55 Syncope and collapse: Secondary | ICD-10-CM

## 2013-04-07 DIAGNOSIS — I4891 Unspecified atrial fibrillation: Secondary | ICD-10-CM

## 2013-04-07 DIAGNOSIS — R531 Weakness: Secondary | ICD-10-CM

## 2013-04-07 LAB — CBC WITH DIFFERENTIAL/PLATELET
Basophils Absolute: 0 10*3/uL (ref 0.0–0.1)
Eosinophils Relative: 0.9 % (ref 0.0–5.0)
MCV: 92 fl (ref 78.0–100.0)
Monocytes Absolute: 0.6 10*3/uL (ref 0.1–1.0)
Neutrophils Relative %: 52.4 % (ref 43.0–77.0)
Platelets: 171 10*3/uL (ref 150.0–400.0)
RDW: 14.2 % (ref 11.5–14.6)
WBC: 5.2 10*3/uL (ref 4.5–10.5)

## 2013-04-07 LAB — BASIC METABOLIC PANEL
CO2: 27 mEq/L (ref 19–32)
Chloride: 108 mEq/L (ref 96–112)
Creatinine, Ser: 1.8 mg/dL — ABNORMAL HIGH (ref 0.4–1.5)
Glucose, Bld: 114 mg/dL — ABNORMAL HIGH (ref 70–99)

## 2013-04-07 LAB — CK: Total CK: 261 U/L — ABNORMAL HIGH (ref 7–232)

## 2013-04-07 NOTE — Progress Notes (Signed)
  Subjective:    Patient ID: Justin Gallegos, male    DOB: 1929/06/19, 79 y.o.   MRN: [id number]  HPI Here for two things. First over the past few months he has had weakness in the legs. He has been on Lipitor for some time now. He had a cpx here last month which turned out well. Also he describes an episode one week ago where he passed out briefly. He has on his knees working in his garden in the afternoon. He remembers it was hot. He felt suddenly dizzy and fell forward on his face and chest. He thinks he may have been out for a few minutes and then came to. He felt very weak and it took a few more minutes before he had the strength to stand up. He denies any chest pains or SOB or palpitations. He has felt fine ever since. Of note he sees Dr. Tenny Craw for stable atrial fibrillation and had an EKG there last December. He had carotid dopplers last December which showed stable lesions bilaterally of less than 40%. He does note he had a "TIA" some years ago where he briefly passed out.    Review of Systems  Respiratory: Negative.   Cardiovascular: Negative.   Gastrointestinal: Negative.   Neurological: Positive for dizziness and weakness. Negative for tremors, seizures, syncope, facial asymmetry, speech difficulty, light-headedness, numbness and headaches.       Objective:   Physical Exam  Constitutional: He is oriented to person, place, and time. He appears well-developed and well-nourished. No distress.  HENT:  Head: Normocephalic and atraumatic.  Right Ear: External ear normal.  Left Ear: External ear normal.  Nose: Nose normal.  Mouth/Throat: Oropharynx is clear and moist.  Eyes: Conjunctivae are normal. Pupils are equal, round, and reactive to light.  Neck: No thyromegaly present.  Cardiovascular: Normal rate, normal heart sounds and intact distal pulses.  Exam reveals no gallop and no friction rub.   No murmur heard. Irregular rhythm  Pulmonary/Chest: Effort normal and breath sounds  normal.  Lymphadenopathy:    He has no cervical adenopathy.  Neurological: He is alert and oriented to person, place, and time. He has normal reflexes. No cranial nerve deficit. He exhibits normal muscle tone. Coordination normal.          Assessment & Plan:  The syncopal spell was likely due to getting overheated and dehydrated, but we need to consider the possibility of another TIA. Get labs today and set up a brain MRI soon. His muscular weakness could be related to this statin so, so we will check a CK level

## 2013-04-09 NOTE — Progress Notes (Signed)
Quick Note:  I spoke with pt and sent over results. ______

## 2013-04-09 NOTE — Progress Notes (Signed)
Quick Note:  I also put a copy of labs in mail, per pt request. ______

## 2013-04-15 ENCOUNTER — Ambulatory Visit (INDEPENDENT_AMBULATORY_CARE_PROVIDER_SITE_OTHER): Payer: Medicare Other | Admitting: General Practice

## 2013-04-15 ENCOUNTER — Other Ambulatory Visit: Payer: Self-pay | Admitting: Family

## 2013-04-15 DIAGNOSIS — I4891 Unspecified atrial fibrillation: Secondary | ICD-10-CM

## 2013-04-15 LAB — POCT INR: INR: 1.9

## 2013-04-16 ENCOUNTER — Ambulatory Visit
Admission: RE | Admit: 2013-04-16 | Discharge: 2013-04-16 | Disposition: A | Discharge status: Home / Self Care | Payer: Medicare Other | Source: Ambulatory Visit | Attending: Family Medicine | Admitting: Family Medicine

## 2013-04-16 DIAGNOSIS — R55 Syncope and collapse: Secondary | ICD-10-CM

## 2013-04-16 NOTE — Progress Notes (Signed)
Quick Note:  I spoke with pt ______ 

## 2013-05-27 ENCOUNTER — Ambulatory Visit (INDEPENDENT_AMBULATORY_CARE_PROVIDER_SITE_OTHER): Payer: Medicare Other | Admitting: General Practice

## 2013-05-27 DIAGNOSIS — Z7901 Long term (current) use of anticoagulants: Secondary | ICD-10-CM

## 2013-05-27 DIAGNOSIS — I4891 Unspecified atrial fibrillation: Secondary | ICD-10-CM

## 2013-05-27 DIAGNOSIS — Z23 Encounter for immunization: Secondary | ICD-10-CM

## 2013-07-05 ENCOUNTER — Ambulatory Visit (INDEPENDENT_AMBULATORY_CARE_PROVIDER_SITE_OTHER): Payer: Medicare Other | Admitting: General Practice

## 2013-07-05 DIAGNOSIS — I4891 Unspecified atrial fibrillation: Secondary | ICD-10-CM

## 2013-07-05 DIAGNOSIS — Z7901 Long term (current) use of anticoagulants: Secondary | ICD-10-CM

## 2013-07-05 NOTE — Progress Notes (Signed)
Pre-visit discussion using our clinic review tool. No additional management support is needed unless otherwise documented below in the visit note.  

## 2013-07-21 ENCOUNTER — Encounter (HOSPITAL_COMMUNITY): Payer: Medicare Other

## 2013-07-21 ENCOUNTER — Ambulatory Visit (INDEPENDENT_AMBULATORY_CARE_PROVIDER_SITE_OTHER): Payer: Medicare Other | Admitting: Physician Assistant

## 2013-07-21 ENCOUNTER — Encounter: Payer: Self-pay | Admitting: Physician Assistant

## 2013-07-21 VITALS — BP 120/90 | HR 104 | Ht 71.5 in | Wt 201.0 lb

## 2013-07-21 DIAGNOSIS — I10 Essential (primary) hypertension: Secondary | ICD-10-CM

## 2013-07-21 DIAGNOSIS — I4891 Unspecified atrial fibrillation: Secondary | ICD-10-CM

## 2013-07-21 DIAGNOSIS — E785 Hyperlipidemia, unspecified: Secondary | ICD-10-CM

## 2013-07-21 DIAGNOSIS — L299 Pruritus, unspecified: Secondary | ICD-10-CM | POA: Insufficient documentation

## 2013-07-21 DIAGNOSIS — R0989 Other specified symptoms and signs involving the circulatory and respiratory systems: Secondary | ICD-10-CM

## 2013-07-21 NOTE — Assessment & Plan Note (Signed)
He is due for repeat carotid Dopplers. We'll schedule.

## 2013-07-21 NOTE — Patient Instructions (Addendum)
Your physician recommends that you return for lab work can have it done at Primary care office : Fasting LIPID profile , LFT  Your physician wants you to follow-up in: 1 year with DR. Tenny Craw You will receive a reminder letter in the mail two months in advance. If you don't receive a letter, please call our office to schedule the follow-up appointment.   Your physician recommends that you continue on your current medications as directed. Please refer to the Current Medication list given to you today.  Your physician recommends you follow a low sodium diet 2 Gram Low Sodium Diet A 2 gram sodium diet restricts the amount of sodium in the diet to no more than 2 g or 2000 mg daily. Limiting the amount of sodium is often used to help lower blood pressure. It is important if you have heart, liver, or kidney problems. Many foods contain sodium for flavor and sometimes as a preservative. When the amount of sodium in a diet needs to be low, it is important to know what to look for when choosing foods and drinks. The following includes some information and guidelines to help make it easier for you to adapt to a low sodium diet. QUICK TIPS  Do not add salt to food.  Avoid convenience items and fast food.  Choose unsalted snack foods.  Buy lower sodium products, often labeled as "lower sodium" or "no salt added."  Check food labels to learn how much sodium is in 1 serving.  When eating at a restaurant, ask that your food be prepared with less salt or none, if possible. READING FOOD LABELS FOR SODIUM INFORMATION The nutrition facts label is a good place to find how much sodium is in foods. Look for products with no more than 500 to 600 mg of sodium per meal and no more than 150 mg per serving. Remember that 2 g = 2000 mg. The food label may also list foods as:  Sodium-free: Less than 5 mg in a serving.  Very low sodium: 35 mg or less in a serving.  Low-sodium: 140 mg or less in a serving.  Light in  sodium: 50% less sodium in a serving. For example, if a food that usually has 300 mg of sodium is changed to become light in sodium, it will have 150 mg of sodium.  Reduced sodium: 25% less sodium in a serving. For example, if a food that usually has 400 mg of sodium is changed to reduced sodium, it will have 300 mg of sodium. CHOOSING FOODS Grains  Avoid: Salted crackers and snack items. Some cereals, including instant hot cereals. Bread stuffing and biscuit mixes. Seasoned rice or pasta mixes.  Choose: Unsalted snack items. Low-sodium cereals, oats, puffed wheat and rice, shredded wheat. English muffins and bread. Pasta. Meats  Avoid: Salted, canned, smoked, spiced, pickled meats, including fish and poultry. Bacon, ham, sausage, cold cuts, hot dogs, anchovies.  Choose: Low-sodium canned tuna and salmon. Fresh or frozen meat, poultry, and fish. Dairy  Avoid: Processed cheese and spreads. Cottage cheese. Buttermilk and condensed milk. Regular cheese.  Choose: Milk. Low-sodium cottage cheese. Yogurt. Sour cream. Low-sodium cheese. Fruits and Vegetables  Avoid: Regular canned vegetables. Regular canned tomato sauce and paste. Frozen vegetables in sauces. Olives. Rosita Fire. Relishes. Sauerkraut.  Choose: Low-sodium canned vegetables. Low-sodium tomato sauce and paste. Frozen or fresh vegetables. Fresh and frozen fruit. Condiments  Avoid: Canned and packaged gravies. Worcestershire sauce. Tartar sauce. Barbecue sauce. Soy sauce. Steak sauce. Ketchup. Onion,  garlic, and table salt. Meat flavorings and tenderizers.  Choose: Fresh and dried herbs and spices. Low-sodium varieties of mustard and ketchup. Lemon juice. Tabasco sauce. Horseradish. SAMPLE 2 GRAM SODIUM MEAL PLAN Breakfast / Sodium (mg)  1 cup low-fat milk / 143 mg  2 slices whole-wheat toast / 270 mg  1 tbs heart-healthy margarine / 153 mg  1 hard-boiled egg / 139 mg  1 small orange / 0 mg Lunch / Sodium (mg)  1 cup raw  carrots / 76 mg   cup hummus / 298 mg  1 cup low-fat milk / 143 mg   cup red grapes / 2 mg  1 whole-wheat pita bread / 356 mg Dinner / Sodium (mg)  1 cup whole-wheat pasta / 2 mg  1 cup low-sodium tomato sauce / 73 mg  3 oz lean ground beef / 57 mg  1 small side salad (1 cup raw spinach leaves,  cup cucumber,  cup yellow bell pepper) with 1 tsp olive oil and 1 tsp red wine vinegar / 25 mg Snack / Sodium (mg)  1 container low-fat vanilla yogurt / 107 mg  3 graham cracker squares / 127 mg Nutrient Analysis  Calories: 2033  Protein: 77 g  Carbohydrate: 282 g  Fat: 72 g  Sodium: 1971 mg Document Released: 07/29/2005 Document Revised: 10/21/2011 Document Reviewed: 10/30/2009 [employer] Patient Information 2014 [place], [place].

## 2013-07-21 NOTE — Assessment & Plan Note (Signed)
Patient's heart rhythm was up a little when he first got here but came down nicely. He is on Coumadin and metoprolol. No changes today.

## 2013-07-21 NOTE — Assessment & Plan Note (Signed)
Followed by primary care.   

## 2013-07-21 NOTE — Assessment & Plan Note (Signed)
Patient is complaining of itching of his lower legs and ankles when he lays down at night. He has no rash or dry skin. He is asking for prescription for hydrocortisone 2%. I asked him to discuss this with his primary care.

## 2013-07-21 NOTE — Progress Notes (Signed)
HPI:   This is a pleasant 79 year old male patient Dr. Huston Foley who is here for yearly followup of chronic atrial fibrillation and hyperlipidemia. He denies any cardiac problems in the past year. 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 He denies chest pain, palpitations, dyspnea, dyspnea on exertion, dizziness, or presyncope. His atorvastatin was stopped 6 months ago by his primary physician because of leg cramps. His cholesterol back in June was 173 triglycerides 191 LDL 101. They have not been checked since his atorvastatin was stopped. He says he does eat eggs every day and also uses salt regularly including canned soups.   Allergies -- Amoxicillin-Pot Clavulanate    --  REACTION: throat swelling  Current Outpatient Prescriptions on File Prior to Visit: fenofibrate 160 MG tablet, Take 1 tablet (160 mg total) by mouth daily., Disp: 90 tablet, Rfl: 3 fish oil-omega-3 fatty acids 1000 MG capsule, Take 1 g by mouth daily.  , Disp: , Rfl:  fluticasone (FLONASE) 50 MCG/ACT nasal spray, Place 2 sprays into the nose as needed., Disp: 48 g, Rfl: 3 metoprolol succinate (TOPROL-XL) 25 MG 24 hr tablet, One half tab every day, Disp: 90 tablet, Rfl: 3 warfarin (COUMADIN) 5 MG tablet, Take 5 mg by mouth daily. Except 5 days a week take 2.5 mg, Disp: , Rfl:  warfarin (COUMADIN) 5 MG tablet, TAKE ONE TABLET BY MOUTH ONCE DAILY AS DIRECTED, Disp: 90 tablet, Rfl: 0  No current facility-administered medications on file prior to visit.   Past Medical History:   Hyperlipidemia                                               Hypertension                                                 Arthritis                                                    Low back pain                                                TIA (transient ischemic attack)                              Atrial fibrillation                                            Comment:sees Dr. Dietrich Pates    Hemorrhoids  Cancer                                                         Comment:ureteral, per Dr. Laverle Patter   Past Surgical History:   APPENDECTOMY                                                  TONSILLECTOMY                                                 HERNIA REPAIR                                                 NEPHRECTOMY                                                   URETERECTOMY                                                  CATARACT EXTRACTION, BILATERAL                   2013           Comment:per Dr. Dione Booze    COLONOSCOPY                                      06-28-10       Comment:per Dr. Leone Payor, benign polyps and severe               diverticulosis, no repeats needed   Review of patient's family history indicates:   Coronary artery disease                                   Comment: fhx   Stroke                                                    Comment: fhx   Social History   Marital Status: Married             Spouse Name:                      Years of Education:                 Number of children:  Occupational History   None on file  Social History Main Topics   Smoking Status: Never Smoker                     Smokeless Status: Never Used                       Alcohol Use: No             Drug Use: No             Sexual Activity: Not on file        Other Topics            Concern   None on file  Social History Narrative   None on file    ROS: Complains of itching of his ankles when he lays down at night. No evidence of rash or dry skin. Was using his wife's hydrocortisone 2% and asked for prescription. I asked him to discuss with primary care.   PHYSICAL EXAM: Well-nournished, in no acute distress. Neck: Right carotid bruit No JVD, HJR, or thyroid enlargement  Lungs: No tachypnea, clear without wheezing, rales, or rhonchi  Cardiovascular: Irregular irregular, PMI not  displaced, 1/6 systolic murmur at the lateral border, no gallops, bruit, thrill, or heave.  Abdomen: BS normal. Soft without organomegaly, masses, lesions or tenderness.  Extremities: Trace of edema on the right lower leg otherwise lower extremities without cyanosis, clubbing. Good distal pulses bilateral  SKin: Warm, no lesions or rashes   Musculoskeletal: No deformities  Neuro: no focal signs  BP 120/90  Pulse 104  Ht 5' 11.5" (1.816 m)  Wt 201 lb (91.173 kg)  BMI 27.65 kg/m2   EKG: Atrial fibrillation 104 beats per minute nonspecific ST-T wave changes, no acute change

## 2013-07-21 NOTE — Assessment & Plan Note (Signed)
Patient's lipid profile in June was improved but his atorvastatin was stopped 6 months ago because of leg cramps. He needs his lipid profile rechecked. He will have it done his primary care.

## 2013-07-21 NOTE — Assessment & Plan Note (Signed)
Blood pressure is low on the high side today. Recommend 2 g sodium diet.

## 2013-07-28 ENCOUNTER — Other Ambulatory Visit (INDEPENDENT_AMBULATORY_CARE_PROVIDER_SITE_OTHER): Payer: Medicare Other

## 2013-07-28 DIAGNOSIS — E785 Hyperlipidemia, unspecified: Secondary | ICD-10-CM

## 2013-07-28 LAB — LIPID PANEL
Total CHOL/HDL Ratio: 8
VLDL: 43.2 mg/dL — ABNORMAL HIGH (ref 0.0–40.0)

## 2013-07-28 LAB — HEPATIC FUNCTION PANEL
Alkaline Phosphatase: 54 U/L (ref 39–117)
Bilirubin, Direct: 0 mg/dL (ref 0.0–0.3)

## 2013-08-02 ENCOUNTER — Ambulatory Visit: Payer: Medicare Other | Admitting: Internal Medicine

## 2013-08-11 ENCOUNTER — Ambulatory Visit (HOSPITAL_COMMUNITY): Payer: Medicare Other | Attending: Internal Medicine

## 2013-08-11 DIAGNOSIS — I10 Essential (primary) hypertension: Secondary | ICD-10-CM | POA: Insufficient documentation

## 2013-08-11 DIAGNOSIS — I6529 Occlusion and stenosis of unspecified carotid artery: Secondary | ICD-10-CM | POA: Insufficient documentation

## 2013-08-11 DIAGNOSIS — I658 Occlusion and stenosis of other precerebral arteries: Secondary | ICD-10-CM | POA: Insufficient documentation

## 2013-08-11 DIAGNOSIS — Z8673 Personal history of transient ischemic attack (TIA), and cerebral infarction without residual deficits: Secondary | ICD-10-CM | POA: Insufficient documentation

## 2013-08-11 DIAGNOSIS — E785 Hyperlipidemia, unspecified: Secondary | ICD-10-CM | POA: Insufficient documentation

## 2013-08-16 ENCOUNTER — Ambulatory Visit (INDEPENDENT_AMBULATORY_CARE_PROVIDER_SITE_OTHER): Payer: Medicare Other | Admitting: General Practice

## 2013-08-16 DIAGNOSIS — I4891 Unspecified atrial fibrillation: Secondary | ICD-10-CM

## 2013-08-16 LAB — POCT INR: INR: 1.8

## 2013-08-16 NOTE — Progress Notes (Signed)
Pre-visit discussion using our clinic review tool. No additional management support is needed unless otherwise documented below in the visit note.  

## 2013-08-17 ENCOUNTER — Other Ambulatory Visit: Payer: Self-pay | Admitting: General Practice

## 2013-08-17 ENCOUNTER — Other Ambulatory Visit: Payer: Self-pay | Admitting: Family

## 2013-08-17 MED ORDER — WARFARIN SODIUM 5 MG PO TABS: ORAL_TABLET | ORAL | Status: DC

## 2013-08-23 ENCOUNTER — Ambulatory Visit (INDEPENDENT_AMBULATORY_CARE_PROVIDER_SITE_OTHER): Payer: Medicare Other | Admitting: Family Medicine

## 2013-08-23 ENCOUNTER — Encounter: Payer: Self-pay | Admitting: Family Medicine

## 2013-08-23 VITALS — BP 124/78 | HR 111 | Temp 98.9°F | Wt 203.0 lb

## 2013-08-23 DIAGNOSIS — E785 Hyperlipidemia, unspecified: Secondary | ICD-10-CM

## 2013-08-23 DIAGNOSIS — M545 Low back pain, unspecified: Secondary | ICD-10-CM

## 2013-08-23 NOTE — Progress Notes (Signed)
Pre visit review using our clinic review tool, if applicable. No additional management support is needed unless otherwise documented below in the visit note. 

## 2013-08-23 NOTE — Progress Notes (Signed)
   Subjective:    Patient ID: Justin Gallegos, male    DOB: 05/02/1929, 80 y.o.   MRN: [id number]  HPI Here for 3 things. First he developed a mild sharp pain in the right lower back 3 days ago. It feels better today. Heat and Tylenol help a lot. Second he asks if he needs another pneumonia shot after getting one 8 years ago. Last he wants to discuss his recent high lipid levels. He stopped taking Lipitor 6 months ago due to myalgias and he asks if he could try taking this 3 days a week.    Review of Systems  Constitutional: Negative.   Respiratory: Negative.   Cardiovascular: Negative.   Musculoskeletal: Positive for back pain.       Objective:   Physical Exam  Constitutional: He appears well-developed and well-nourished.  Pulmonary/Chest: Effort normal and breath sounds normal.  Abdominal: Soft. Bowel sounds are normal.  Musculoskeletal: Normal range of motion. He exhibits no edema and no tenderness.          Assessment & Plan:  His back strain seems to be resolving. Recheck prn. He will not need any further pneumonia vaccines. We will get back on Lipitor but take it only 3 days a week.

## 2013-09-27 ENCOUNTER — Ambulatory Visit (INDEPENDENT_AMBULATORY_CARE_PROVIDER_SITE_OTHER): Payer: Medicare Other | Admitting: General Practice

## 2013-09-27 DIAGNOSIS — Z5181 Encounter for therapeutic drug level monitoring: Secondary | ICD-10-CM | POA: Insufficient documentation

## 2013-09-27 DIAGNOSIS — I4891 Unspecified atrial fibrillation: Secondary | ICD-10-CM

## 2013-09-27 LAB — POCT INR: INR: 2.4

## 2013-09-27 NOTE — Progress Notes (Signed)
Pre-visit discussion using our clinic review tool. No additional management support is needed unless otherwise documented below in the visit note.  

## 2013-11-08 ENCOUNTER — Ambulatory Visit (INDEPENDENT_AMBULATORY_CARE_PROVIDER_SITE_OTHER): Payer: Medicare Other | Admitting: General Practice

## 2013-11-08 DIAGNOSIS — I4891 Unspecified atrial fibrillation: Secondary | ICD-10-CM

## 2013-11-08 DIAGNOSIS — Z5181 Encounter for therapeutic drug level monitoring: Secondary | ICD-10-CM

## 2013-11-08 LAB — POCT INR: INR: 2

## 2013-11-08 NOTE — Progress Notes (Signed)
Pre visit review using our clinic review tool, if applicable. No additional management support is needed unless otherwise documented below in the visit note. 

## 2013-11-24 ENCOUNTER — Other Ambulatory Visit (HOSPITAL_COMMUNITY): Payer: Self-pay | Admitting: Urology

## 2013-11-24 DIAGNOSIS — C679 Malignant neoplasm of bladder, unspecified: Secondary | ICD-10-CM

## 2013-11-24 DIAGNOSIS — C669 Malignant neoplasm of unspecified ureter: Secondary | ICD-10-CM

## 2013-11-24 DIAGNOSIS — R310 Gross hematuria: Secondary | ICD-10-CM

## 2013-12-06 ENCOUNTER — Ambulatory Visit (HOSPITAL_COMMUNITY)
Admission: RE | Admit: 2013-12-06 | Discharge: 2013-12-06 | Disposition: A | Discharge status: Home / Self Care | Payer: Medicare Other | Source: Ambulatory Visit | Attending: Urology | Admitting: Urology

## 2013-12-06 DIAGNOSIS — R319 Hematuria, unspecified: Secondary | ICD-10-CM | POA: Insufficient documentation

## 2013-12-06 DIAGNOSIS — C669 Malignant neoplasm of unspecified ureter: Secondary | ICD-10-CM

## 2013-12-06 DIAGNOSIS — C679 Malignant neoplasm of bladder, unspecified: Secondary | ICD-10-CM

## 2013-12-06 DIAGNOSIS — Z8551 Personal history of malignant neoplasm of bladder: Secondary | ICD-10-CM | POA: Insufficient documentation

## 2013-12-06 DIAGNOSIS — R310 Gross hematuria: Secondary | ICD-10-CM

## 2013-12-06 DIAGNOSIS — Z905 Acquired absence of kidney: Secondary | ICD-10-CM | POA: Insufficient documentation

## 2013-12-20 ENCOUNTER — Ambulatory Visit: Payer: Medicare Other

## 2013-12-20 ENCOUNTER — Ambulatory Visit (INDEPENDENT_AMBULATORY_CARE_PROVIDER_SITE_OTHER): Payer: Medicare Other | Admitting: Family

## 2013-12-20 DIAGNOSIS — I4891 Unspecified atrial fibrillation: Secondary | ICD-10-CM

## 2013-12-20 DIAGNOSIS — Z5181 Encounter for therapeutic drug level monitoring: Secondary | ICD-10-CM

## 2013-12-20 LAB — POCT INR: INR: 2.1

## 2013-12-20 NOTE — Patient Instructions (Signed)
Continue to take 1/2 tablet all days except take 1 tablet on M/W/F.  Re-check patient in 6 weeks per patient request.  Anticoagulation Dose Instructions as of 12/20/2013     Justin Gallegos Tue Wed Thu Fri Sat   New Dose 2.5 mg 5 mg 2.5 mg 5 mg 2.5 mg 5 mg 2.5 mg    Description       Continue to take 1/2 tablet all days except take 1 tablet on M/W/F.  Re-check patient in 6 weeks per patient request.

## 2014-01-10 ENCOUNTER — Telehealth: Payer: Self-pay | Admitting: Family Medicine

## 2014-01-10 NOTE — Telephone Encounter (Signed)
[facility]I [facility]. Pt goes twice a wk at 20 minutes each times.

## 2014-01-31 ENCOUNTER — Ambulatory Visit: Payer: Medicare Other

## 2014-01-31 ENCOUNTER — Ambulatory Visit (INDEPENDENT_AMBULATORY_CARE_PROVIDER_SITE_OTHER): Payer: Medicare Other | Admitting: Family

## 2014-01-31 DIAGNOSIS — I4891 Unspecified atrial fibrillation: Secondary | ICD-10-CM

## 2014-01-31 DIAGNOSIS — Z5181 Encounter for therapeutic drug level monitoring: Secondary | ICD-10-CM

## 2014-01-31 LAB — POCT INR: INR: 3

## 2014-01-31 NOTE — Patient Instructions (Signed)
Eat a few extra servings of green veggies. Continue to take 1/2 tablet all days except take 1 tablet on M/W/F.  Re-check patient in 6 weeks per patient request.  Anticoagulation Dose Instructions as of 01/31/2014     Dorene Grebe Tue Wed Thu Fri Sat   New Dose 2.5 mg 5 mg 2.5 mg 5 mg 2.5 mg 5 mg 2.5 mg    Description       Eat a few extra servings of green veggies. Continue to take 1/2 tablet all days except take 1 tablet on M/W/F.  Re-check patient in 6 weeks per patient request.

## 2014-02-14 ENCOUNTER — Other Ambulatory Visit: Payer: Self-pay | Admitting: Family Medicine

## 2014-03-14 ENCOUNTER — Ambulatory Visit (INDEPENDENT_AMBULATORY_CARE_PROVIDER_SITE_OTHER): Payer: Medicare Other | Admitting: Family

## 2014-03-14 ENCOUNTER — Ambulatory Visit: Payer: Medicare Other

## 2014-03-14 DIAGNOSIS — I4891 Unspecified atrial fibrillation: Secondary | ICD-10-CM

## 2014-03-14 DIAGNOSIS — Z5181 Encounter for therapeutic drug level monitoring: Secondary | ICD-10-CM

## 2014-03-14 LAB — POCT INR: INR: 2.6

## 2014-03-14 NOTE — Patient Instructions (Signed)
Continue to take 1/2 tablet all days except take 1 tablet on M/W/F.  Re-check patient in 6 weeks per patient request.  Anticoagulation Dose Instructions as of 03/14/2014     Justin Gallegos Tue Wed Thu Fri Sat   New Dose 2.5 mg 5 mg 2.5 mg 5 mg 2.5 mg 5 mg 2.5 mg    Description       Continue to take 1/2 tablet all days except take 1 tablet on M/W/F.  Re-check patient in 6 weeks per patient request.

## 2014-04-05 ENCOUNTER — Encounter: Payer: Self-pay | Admitting: Family Medicine

## 2014-04-05 ENCOUNTER — Ambulatory Visit (INDEPENDENT_AMBULATORY_CARE_PROVIDER_SITE_OTHER): Payer: Medicare Other | Admitting: Family Medicine

## 2014-04-05 VITALS — BP 132/87 | HR 80 | Temp 98.3°F | Ht 71.5 in | Wt 200.0 lb

## 2014-04-05 DIAGNOSIS — L290 Pruritus ani: Secondary | ICD-10-CM | POA: Insufficient documentation

## 2014-04-05 DIAGNOSIS — B356 Tinea cruris: Secondary | ICD-10-CM | POA: Insufficient documentation

## 2014-04-05 MED ORDER — KETOCONAZOLE 2 % EX CREA: 1.0000 "application " | TOPICAL_CREAM | Freq: Two times a day (BID) | CUTANEOUS | Status: DC

## 2014-04-05 MED ORDER — HYDROCORTISONE 2.5 % RE CREA: 1.0000 "application " | TOPICAL_CREAM | Freq: Two times a day (BID) | RECTAL | Status: DC

## 2014-04-05 NOTE — Progress Notes (Signed)
   Subjective:    Patient ID: Justin Gallegos, male    DOB: [birth year], 80 y.o.   MRN: [id number]  HPI Here for 2 things. First he uses Proctocol cream for rectal itching and he needs refills. Also for the past month he has had an itchy rash over the penis and scrotum.    Review of Systems  Constitutional: Negative.   Skin: Positive for rash.       Objective:   Physical Exam  Constitutional: He appears well-developed and well-nourished.  Skin:  The scrotum, foreskin, and glans of the penis have a red splotchy macular rash           Assessment & Plan:  Treat the tinea with Ketoconazole cream.

## 2014-04-05 NOTE — Progress Notes (Signed)
Pre visit review using our clinic review tool, if applicable. No additional management support is needed unless otherwise documented below in the visit note. 

## 2014-04-20 ENCOUNTER — Encounter: Payer: Self-pay | Admitting: Family Medicine

## 2014-04-20 ENCOUNTER — Ambulatory Visit (INDEPENDENT_AMBULATORY_CARE_PROVIDER_SITE_OTHER): Payer: Medicare Other | Admitting: Family Medicine

## 2014-04-20 VITALS — BP 110/70 | HR 64 | Temp 97.8°F | Wt 201.0 lb

## 2014-04-20 DIAGNOSIS — IMO0002 Reserved for concepts with insufficient information to code with codable children: Secondary | ICD-10-CM

## 2014-04-20 DIAGNOSIS — S76219A Strain of adductor muscle, fascia and tendon of unspecified thigh, initial encounter: Secondary | ICD-10-CM

## 2014-04-20 NOTE — Patient Instructions (Addendum)
I would ice area for 15-20 minutes 3-4 x a day. You can take tylenol as needed for pain. Avoid your exercise program for tomorrow.   Check in with [place] if not improving in next 2-3 days or if symptoms worsen or if you have new symptoms.   Thanks, Dr. Yong Channel  Groin Strain A groin strain (also called a groin pull) is an injury to the muscles or tendon on the upper inner part of the thigh. These muscles are called the adductor muscles or groin muscles. They are responsible for moving the leg across the body. A muscle strain occurs when a muscle is overstretched and some muscle fibers are torn. A groin strain can range from mild to severe depending on how many muscle fibers are affected and whether the muscle fibers are partially or completely torn.  Groin strains usually occur during exercise or participation in sports. The injury often happens when a sudden, violent force is placed on a muscle, stretching the muscle too far. A strain is more likely to occur when your muscles are not warmed up or if you are not properly conditioned. Depending on the severity of the groin strain, recovery time may vary from a few weeks to several weeks. Severe injuries often require 4-6 weeks for recovery. In these cases, complete healing can take 4-5 months.  CAUSES   Stretching the groin muscles too far or too suddenly, often during side-to-side motion with an abrupt change in direction.  Putting repeated stress on the groin muscles over a long period of time.  Performing vigorous activity without properly stretching the groin muscles beforehand. SYMPTOMS   Pain and tenderness in the groin area. This begins as sharp pain and persists as a dull ache.  Popping or snapping feeling when the injury occurs (for severe strains).  Swelling or bruising.  Muscle spasms.  Weakness in the leg.  Stiffness in the groin area with decreased ability to move the affected muscles. DIAGNOSIS  Your caregiver will perform a  physical exam to diagnose a groin strain. You will be asked about your symptoms and how the injury occurred. X-rays are sometimes needed to rule out a broken bone or cartilage problems. Your caregiver may order a CT scan or MRI if a complete muscle tear is suspected. TREATMENT  A groin strain will often heal on its own. Your caregiver may prescribe medicines to help manage pain and swelling (anti-inflammatory medicine). You may be told to use crutches for the first few days to minimize your pain. HOME CARE INSTRUCTIONS   Rest. Do not use the strained muscle if it causes pain.  Put ice on the injured area.  Put ice in a plastic bag.  Place a towel between your skin and the bag.  Leave the ice on for 15-20 minutes, every 2-3 hours. Do this for the first 2 days after the injury.  Only take over-the-counter or prescription medicines as directed by your caregiver.  Wrap the injured area with an elastic bandage as directed by your caregiver.  Keep the injured leg raised (elevated).  Walk, stretch, and perform range-of-motion exercises to improve blood flow to the injured area. Only perform these activities if you can do so without any pain. To prevent muscle strains:  Warm up before exercise.  Develop proper conditioning and strength in the groin muscles. SEEK IMMEDIATE MEDICAL CARE IF:   You have increased pain or swelling in the affected area.   Your symptoms are not improving or are getting worse.  MAKE SURE YOU:   Understand these instructions.  Will watch your condition.  Will get help right away if you are not doing well or get worse. Document Released: 03/26/2004 Document Revised: 07/15/2012 Document Reviewed: 04/01/2012 [facility] Patient Information 2015 [place], [place]. This information is not intended to replace advice given to you by your health care provider. Make sure you discuss any questions you have with your health care provider.

## 2014-04-20 NOTE — Progress Notes (Signed)
  Garret Reddish, MD Phone: [phone]  Subjective:   Justin Gallegos is a 80 y.o. year old very pleasant male patient who presents with the following:  Right thigh pain Patient admits to an intense exercise program for his age bracket. He participated in this yesterday. Describes no pain during exercise. This morning he woke up and noted right upper thigh pain near his groin. He states the pain is worse with standing or walking. Pain can get up to 9/10 rated as aching. Improves with sitting down. He is not tried any therapy other than icing the area once. He has no pain in his actual groin and has no bulge in his groin. He does feel like his leg is swollen in the upper right area. There is no bruising or bleeding. He denies hitting the area.no worsening of pain as has been consistent since this morning ROS-no fever chills or expanding redness.Patient denies chest pain, shortness of breath, dyspnea on exertion, orthopnea, PND, recent immobilization, history DVT, active cancer, unilateral calf swelling, superficial veins present, swelling of entire leg, localized tenderness in calf, pitting edema greater in symptomatic leg, paralysis, history DVT.  Past Medical History-history of TIA, history of only one kidney per patient, renal insufficiency, atrial fibrillation on Coumadin, hypertension, hyperlipidemia  Medications- reviewed and updated Current Outpatient Prescriptions  Medication Sig Dispense Refill  . atorvastatin (LIPITOR) 40 MG tablet Take 40 mg by mouth 4 (four) times a week.      . fenofibrate 160 MG tablet TAKE ONE TABLET BY MOUTH ONCE DAILY  90 tablet  0  . fish oil-omega-3 fatty acids 1000 MG capsule Take 1 g by mouth daily.        . metoprolol succinate (TOPROL-XL) 25 MG 24 hr tablet TAKE ONE-HALF TABLET BY MOUTH ONCE DAILY  90 tablet  0  . warfarin (COUMADIN) 5 MG tablet 5 mg. Take 5 mg three times a week and 2.5 mg all other days      . fluticasone (FLONASE) 50 MCG/ACT nasal  spray Place 2 sprays into the nose as needed.  48 g  3  . hydrocortisone (PROCTOSOL HC) 2.5 % rectal cream Place 1 application rectally 2 (two) times daily.  30 g  5  . ketoconazole (NIZORAL) 2 % cream Apply 1 application topically 2 (two) times daily.  30 g  5   No current facility-administered medications for this visit.    Objective: BP 110/70  Pulse 64  Temp(Src) 97.8 F (36.6 C)  Wt 201 lb (91.173 kg) Gen: NAD, resting comfortably in chair, takes his pants off with little difficulty Abdomen: soft/nontender/nondistended/normal bowel sounds.  Groin-no hernia Ext: Trace edema bilateral ankles with equal calf size. No pain with hip flexion or extension or abduction. Patient does have pain with adduction worse if he lifts his leg and then adducts the leg. There is some swelling in the upper thigh without discrete mass or bulge. Skin: warm, dry, no rash  Assessment/Plan:  Groin strain Suspect strain with exercise yesterday. Advised ice, taking a break from leg exercises, Tylenol as needed. He does have swelling in his leg but is diffuse so I doubt a hematoma which is a concern as he is on Coumadin. Area is not pulsatile and an atypical area for aneurysm. Discussed following up with [name] if no improvement

## 2014-04-22 ENCOUNTER — Encounter: Payer: Self-pay | Admitting: Family Medicine

## 2014-04-22 ENCOUNTER — Ambulatory Visit (INDEPENDENT_AMBULATORY_CARE_PROVIDER_SITE_OTHER): Payer: Medicare Other | Admitting: Family Medicine

## 2014-04-22 VITALS — BP 130/81 | HR 74 | Temp 98.0°F | Ht 71.5 in | Wt 204.0 lb

## 2014-04-22 DIAGNOSIS — T148XXA Other injury of unspecified body region, initial encounter: Secondary | ICD-10-CM

## 2014-04-22 DIAGNOSIS — Z7901 Long term (current) use of anticoagulants: Secondary | ICD-10-CM

## 2014-04-22 LAB — CBC WITH DIFFERENTIAL/PLATELET
BASOS PCT: 0.3 % (ref 0.0–3.0)
Basophils Absolute: 0 10*3/uL (ref 0.0–0.1)
EOS PCT: 0.6 % (ref 0.0–5.0)
Eosinophils Absolute: 0 10*3/uL (ref 0.0–0.7)
HCT: 42.6 % (ref 39.0–52.0)
HEMOGLOBIN: 14.6 g/dL (ref 13.0–17.0)
LYMPHS ABS: 2 10*3/uL (ref 0.7–4.0)
Lymphocytes Relative: 27.9 % (ref 12.0–46.0)
MCHC: 34.2 g/dL (ref 30.0–36.0)
MCV: 92.8 fl (ref 78.0–100.0)
MONO ABS: 0.9 10*3/uL (ref 0.1–1.0)
Monocytes Relative: 11.9 % (ref 3.0–12.0)
NEUTROS ABS: 4.3 10*3/uL (ref 1.4–7.7)
Neutrophils Relative %: 59.3 % (ref 43.0–77.0)
Platelets: 167 10*3/uL (ref 150.0–400.0)
RBC: 4.59 Mil/uL (ref 4.22–5.81)
RDW: 13.8 % (ref 11.5–15.5)
WBC: 7.3 10*3/uL (ref 4.0–10.5)

## 2014-04-22 LAB — POCT INR: INR: 3.1

## 2014-04-22 MED ORDER — WARFARIN SODIUM 5 MG PO TABS: 2.5000 mg | ORAL_TABLET | Freq: Every day | ORAL | Status: DC

## 2014-04-22 MED ORDER — HYDROCODONE-ACETAMINOPHEN 10-325 MG PO TABS: 1.0000 | ORAL_TABLET | Freq: Four times a day (QID) | ORAL | Status: DC | PRN

## 2014-04-22 NOTE — Progress Notes (Signed)
Pre visit review using our clinic review tool, if applicable. No additional management support is needed unless otherwise documented below in the visit note. 

## 2014-04-22 NOTE — Progress Notes (Signed)
   Subjective:    Patient ID: Justin Gallegos, male    DOB: July 21, 1929, 80 y.o.   MRN: [id number]  HPI Here for worsening pain in the right thigh. This started 3 days ago, the day after his exercise routine. He takes an exercise class twice a week involving leg and arm exercises. He was seen here on 04-20-14 with some tenderness in the thigh but nothing visible. It was felt likely to be a muscle strain and he has rested since then. However the thigh has become more swollen and he has a good deal more pain. His last INR was on target at 2.6 on 03-14-14 and he has been on his current dose of Coumadin for a long time now.    Review of Systems  Constitutional: Negative.   Respiratory: Negative.   Cardiovascular: Positive for leg swelling. Negative for chest pain and palpitations.       Objective:   Physical Exam  Constitutional:  Walks slowly with pain   Musculoskeletal:  The right thigh is swollen and quite tender. The calf is not involved at all. There is extensive ecchymosis along the posterior right thigh and in the popliteal space.           Assessment & Plan:  He has a hematoma from a torn groin muscle. He will rest and use ice packs 3-4 times a day. We will reduce his Coumadin dose to 2.5 mg daily for the time being. Get an INR and a CBC today. Recheck next week.

## 2014-04-25 ENCOUNTER — Ambulatory Visit: Payer: Medicare Other | Admitting: Family

## 2014-05-11 ENCOUNTER — Encounter: Payer: Self-pay | Admitting: Family Medicine

## 2014-05-11 ENCOUNTER — Ambulatory Visit (INDEPENDENT_AMBULATORY_CARE_PROVIDER_SITE_OTHER): Payer: Medicare Other | Admitting: Family Medicine

## 2014-05-11 VITALS — BP 141/90 | HR 107 | Temp 98.1°F | Ht 71.0 in | Wt 195.0 lb

## 2014-05-11 DIAGNOSIS — Z125 Encounter for screening for malignant neoplasm of prostate: Secondary | ICD-10-CM

## 2014-05-11 DIAGNOSIS — Z7901 Long term (current) use of anticoagulants: Secondary | ICD-10-CM

## 2014-05-11 DIAGNOSIS — E785 Hyperlipidemia, unspecified: Secondary | ICD-10-CM

## 2014-05-11 DIAGNOSIS — Z23 Encounter for immunization: Secondary | ICD-10-CM

## 2014-05-11 DIAGNOSIS — Z0000 Encounter for general adult medical examination without abnormal findings: Secondary | ICD-10-CM

## 2014-05-11 LAB — POCT URINALYSIS DIPSTICK
BILIRUBIN UA: NEGATIVE
GLUCOSE UA: NEGATIVE
KETONES UA: NEGATIVE
Leukocytes, UA: NEGATIVE
Nitrite, UA: NEGATIVE
Protein, UA: NEGATIVE
RBC UA: NEGATIVE
Spec Grav, UA: 1.015
UROBILINOGEN UA: 1
pH, UA: 5.5

## 2014-05-11 LAB — HEPATIC FUNCTION PANEL
ALT: 14 U/L (ref 0–53)
AST: 25 U/L (ref 0–37)
Albumin: 3.9 g/dL (ref 3.5–5.2)
Alkaline Phosphatase: 60 U/L (ref 39–117)
BILIRUBIN TOTAL: 1.5 mg/dL — AB (ref 0.2–1.2)
Bilirubin, Direct: 0.3 mg/dL (ref 0.0–0.3)
Total Protein: 6.9 g/dL (ref 6.0–8.3)

## 2014-05-11 LAB — CBC WITH DIFFERENTIAL/PLATELET
BASOS PCT: 0.5 % (ref 0.0–3.0)
Basophils Absolute: 0 10*3/uL (ref 0.0–0.1)
EOS PCT: 0.6 % (ref 0.0–5.0)
Eosinophils Absolute: 0 10*3/uL (ref 0.0–0.7)
HEMATOCRIT: 44.9 % (ref 39.0–52.0)
HEMOGLOBIN: 14.9 g/dL (ref 13.0–17.0)
LYMPHS ABS: 1.7 10*3/uL (ref 0.7–4.0)
Lymphocytes Relative: 26 % (ref 12.0–46.0)
MCHC: 33.2 g/dL (ref 30.0–36.0)
MCV: 94.2 fl (ref 78.0–100.0)
MONOS PCT: 11.1 % (ref 3.0–12.0)
Monocytes Absolute: 0.7 10*3/uL (ref 0.1–1.0)
Neutro Abs: 3.9 10*3/uL (ref 1.4–7.7)
Neutrophils Relative %: 61.8 % (ref 43.0–77.0)
PLATELETS: 315 10*3/uL (ref 150.0–400.0)
RBC: 4.76 Mil/uL (ref 4.22–5.81)
RDW: 14.6 % (ref 11.5–15.5)
WBC: 6.4 10*3/uL (ref 4.0–10.5)

## 2014-05-11 LAB — BASIC METABOLIC PANEL
BUN: 27 mg/dL — ABNORMAL HIGH (ref 6–23)
CALCIUM: 9.1 mg/dL (ref 8.4–10.5)
CO2: 22 mEq/L (ref 19–32)
Chloride: 107 mEq/L (ref 96–112)
Creatinine, Ser: 1.9 mg/dL — ABNORMAL HIGH (ref 0.4–1.5)
GFR: 35.68 mL/min — AB (ref >60.00)
GLUCOSE: 90 mg/dL (ref 70–99)
Potassium: 5.4 mEq/L — ABNORMAL HIGH (ref 3.5–5.1)
SODIUM: 137 meq/L (ref 135–145)

## 2014-05-11 LAB — LIPID PANEL
Cholesterol: 217 mg/dL — ABNORMAL HIGH (ref 0–200)
HDL: 38.5 mg/dL — ABNORMAL LOW (ref >39.00)
LDL CALC: 143 mg/dL — AB (ref 0–99)
NONHDL: 178.5
Total CHOL/HDL Ratio: 6
Triglycerides: 180 mg/dL — ABNORMAL HIGH (ref 0.0–149.0)
VLDL: 36 mg/dL (ref 0.0–40.0)

## 2014-05-11 LAB — TSH: TSH: 2.64 u[IU]/mL (ref 0.35–4.50)

## 2014-05-11 LAB — PSA: PSA: 1.69 ng/mL (ref 0.10–4.00)

## 2014-05-11 MED ORDER — METOPROLOL SUCCINATE ER 25 MG PO TB24: ORAL_TABLET | ORAL | Status: DC

## 2014-05-11 MED ORDER — FENOFIBRATE 160 MG PO TABS: ORAL_TABLET | ORAL | Status: DC

## 2014-05-11 MED ORDER — ATORVASTATIN CALCIUM 40 MG PO TABS: 40.0000 mg | ORAL_TABLET | Freq: Every day | ORAL | Status: DC

## 2014-05-11 NOTE — Progress Notes (Signed)
   Subjective:    Patient ID: Justin Gallegos, male    DOB: [birth year], 80 y.o.   MRN: [id number]  HPI 80 yr old male for a cpx. He feels well except for the swelling and pain in the right leg, but this is improving every day. 3 weeks ago he developed a large hematoma in the right thigh and this resulted in extravasation of blood down into the lower leg. He has cut his Coumadin dose down to 1/2 a tablet daily per our instructions. He has not been exercising at all since then but he is anxious to get back in the gym when he is able.    Review of Systems  Constitutional: Negative.   HENT: Negative.   Eyes: Negative.   Respiratory: Negative.   Cardiovascular: Positive for leg swelling. Negative for chest pain and palpitations.  Gastrointestinal: Negative.   Genitourinary: Negative.   Musculoskeletal: Negative.   Skin: Negative.   Neurological: Negative.   Psychiatric/Behavioral: Negative.        Objective:   Physical Exam  Constitutional: He is oriented to person, place, and time. He appears well-developed and well-nourished. No distress.  HENT:  Head: Normocephalic and atraumatic.  Right Ear: External ear normal.  Left Ear: External ear normal.  Nose: Nose normal.  Mouth/Throat: Oropharynx is clear and moist. No oropharyngeal exudate.  Eyes: Conjunctivae and EOM are normal. Pupils are equal, round, and reactive to light. Right eye exhibits no discharge. Left eye exhibits no discharge. No scleral icterus.  Neck: Neck supple. No JVD present. No tracheal deviation present. No thyromegaly present.  Cardiovascular: Normal rate, normal heart sounds and intact distal pulses.  Exam reveals no gallop and no friction rub.   No murmur heard. Irregular rhythm   Pulmonary/Chest: Effort normal and breath sounds normal. No respiratory distress. He has no wheezes. He has no rales. He exhibits no tenderness.  Abdominal: Soft. Bowel sounds are normal. He exhibits no distension and no mass. There is  no tenderness. There is no rebound and no guarding.  Genitourinary: Rectum normal, prostate normal and penis normal. Guaiac negative stool. No penile tenderness.  Musculoskeletal: Normal range of motion. He exhibits no tenderness.  He still has some swelling and ecchymoses in the right thigh and calf, but this has improved quite a bit from our last exam.   Lymphadenopathy:    He has no cervical adenopathy.  Neurological: He is alert and oriented to person, place, and time. He has normal reflexes. No cranial nerve deficit. He exhibits normal muscle tone. Coordination normal.  Skin: Skin is warm and dry. No rash noted. He is not diaphoretic. No erythema. No pallor.  Psychiatric: He has a normal mood and affect. His behavior is normal. Judgment and thought content normal.          Assessment & Plan:  Well exam. Get fasting labs. Beginning today he will return to his previous Coumadin dose of taking a whole tablet on Mon, Wed, and Friday and taking a 1/2 tablet the other days. He will return for an INR in one week.

## 2014-05-11 NOTE — Progress Notes (Signed)
Pre visit review using our clinic review tool, if applicable. No additional management support is needed unless otherwise documented below in the visit note. 

## 2014-05-11 NOTE — Addendum Note (Signed)
Addended by: Aggie Hacker A on: 05/11/2014 10:38 AM   Modules accepted: Orders

## 2014-05-16 ENCOUNTER — Telehealth: Payer: Self-pay | Admitting: Family Medicine

## 2014-05-16 MED ORDER — WARFARIN SODIUM 5 MG PO TABS: 2.5000 mg | ORAL_TABLET | Freq: Every day | ORAL | Status: DC

## 2014-05-16 NOTE — Telephone Encounter (Signed)
[id number] - [place], [place] is requesting re-fill on warfarin (COUMADIN) 5 MG tablet

## 2014-05-16 NOTE — Telephone Encounter (Signed)
Done

## 2014-05-19 ENCOUNTER — Ambulatory Visit (INDEPENDENT_AMBULATORY_CARE_PROVIDER_SITE_OTHER): Payer: Medicare Other | Admitting: Family

## 2014-05-19 DIAGNOSIS — I4891 Unspecified atrial fibrillation: Secondary | ICD-10-CM

## 2014-05-19 DIAGNOSIS — Z5181 Encounter for therapeutic drug level monitoring: Secondary | ICD-10-CM

## 2014-05-19 LAB — POCT INR: INR: 1.8

## 2014-05-19 NOTE — Patient Instructions (Addendum)
Take an extra 1/2 tab today only (5 mg). Then continue 5 mg MWF and 2.5mg  all other days. Recheck in 4 weeks.   Anticoagulation Dose Instructions as of 05/19/2014     Justin Gallegos Tue Wed Thu Fri Sat   New Dose 2.5 mg 5 mg 2.5 mg 5 mg 2.5 mg 5 mg 2.5 mg    Description       Take an extra 1/2 tab today only (5 mg). Then continue 5 mg MWF and 2.5mg  all other days. Recheck in 4 weeks.

## 2014-06-20 ENCOUNTER — Ambulatory Visit (INDEPENDENT_AMBULATORY_CARE_PROVIDER_SITE_OTHER): Payer: Medicare Other | Admitting: Family

## 2014-06-20 DIAGNOSIS — I4891 Unspecified atrial fibrillation: Secondary | ICD-10-CM

## 2014-06-20 DIAGNOSIS — Z5181 Encounter for therapeutic drug level monitoring: Secondary | ICD-10-CM

## 2014-06-20 LAB — POCT INR: INR: 2.5

## 2014-06-20 NOTE — Patient Instructions (Addendum)
Continue 5 mg MWF and 2.5mg  all other days. Pt requests January appointmnent.    Anticoagulation Dose Instructions as of 06/20/2014      Justin Gallegos Tue Wed Thu Fri Sat   New Dose 2.5 mg 5 mg 2.5 mg 5 mg 2.5 mg 5 mg 2.5 mg    Description        Take an extra 1/2 tab today only (5 mg). Then continue 5 mg MWF and 2.5mg  all other days. Pt requests January appointmnent.

## 2014-07-14 ENCOUNTER — Encounter: Payer: Self-pay | Admitting: Family Medicine

## 2014-07-14 ENCOUNTER — Ambulatory Visit (INDEPENDENT_AMBULATORY_CARE_PROVIDER_SITE_OTHER): Payer: Medicare Other | Admitting: Family Medicine

## 2014-07-14 VITALS — BP 142/87 | HR 90 | Temp 97.8°F | Ht 71.0 in | Wt 200.0 lb

## 2014-07-14 DIAGNOSIS — I482 Chronic atrial fibrillation, unspecified: Secondary | ICD-10-CM

## 2014-07-14 DIAGNOSIS — T148XXA Other injury of unspecified body region, initial encounter: Secondary | ICD-10-CM

## 2014-07-14 DIAGNOSIS — T148 Other injury of unspecified body region: Secondary | ICD-10-CM

## 2014-07-14 NOTE — Progress Notes (Signed)
Pre visit review using our clinic review tool, if applicable. No additional management support is needed unless otherwise documented below in the visit note. 

## 2014-07-14 NOTE — Progress Notes (Signed)
   Subjective:    Patient ID: Justin Gallegos, male    DOB: 1928/10/30, 80 y.o.   MRN: [id number]  HPI Here asking advice about exercise. In early September he was taking part in an intense exercise program with a personal trainer, and he tore a muscle in the right leg. He developed an extensive hematoma which has been slowly resolving since then. We cut back his Coumadin dosing for awhile but he is now back to his usual dosing. His most recent INR was on target at 2.5. The leg feels fine.    Review of Systems  Constitutional: Negative.   Respiratory: Negative.   Cardiovascular: Negative.   Musculoskeletal: Negative.        Objective:   Physical Exam  Constitutional: He appears well-developed and well-nourished.  Cardiovascular: Normal rate, normal heart sounds and intact distal pulses.   Irregular rhythm   Pulmonary/Chest: Effort normal and breath sounds normal.  Musculoskeletal:  The right leg and foot are unremarkable          Assessment & Plan:  He is cleared to return to his exercise program, but he needs to make this less intensive. He will discuss this with his trainer. Recheck prn

## 2014-07-21 ENCOUNTER — Ambulatory Visit (INDEPENDENT_AMBULATORY_CARE_PROVIDER_SITE_OTHER): Payer: Medicare Other | Admitting: Internal Medicine

## 2014-07-21 ENCOUNTER — Encounter: Payer: Self-pay | Admitting: Internal Medicine

## 2014-07-21 VITALS — BP 120/90 | HR 109 | Ht 71.0 in | Wt 201.8 lb

## 2014-07-21 DIAGNOSIS — E785 Hyperlipidemia, unspecified: Secondary | ICD-10-CM

## 2014-07-21 DIAGNOSIS — I482 Chronic atrial fibrillation, unspecified: Secondary | ICD-10-CM

## 2014-07-21 MED ORDER — ROSUVASTATIN CALCIUM 10 MG PO TABS: 5.0000 mg | ORAL_TABLET | ORAL | Status: DC

## 2014-07-21 NOTE — Progress Notes (Signed)
HPI Patinet is an 80 yo with a history of afib and hyperlipidemia  I saw him in clinic in Dec 2013 She was seen my Gerrianne Scale 1 year ago He denies CP  Denies palpitations  Breathing has been stable    Allergies  Allergen Reactions  . Amoxicillin-Pot Clavulanate     REACTION: throat swelling    Current Outpatient Prescriptions  Medication Sig Dispense Refill  . atorvastatin (LIPITOR) 40 MG tablet Take 1 tablet (40 mg total) by mouth daily. (Patient taking differently: Take 40 mg by mouth once a week. 4 DAYS A WEEK) 90 tablet 3  . fenofibrate 160 MG tablet TAKE ONE TABLET BY MOUTH ONCE DAILY 90 tablet 3  . fish oil-omega-3 fatty acids 1000 MG capsule Take 1 g by mouth daily.      . fluticasone (FLONASE) 50 MCG/ACT nasal spray Place 2 sprays into the nose as needed. 48 g 3  . ketoconazole (NIZORAL) 2 % cream Apply 1 application topically 2 (two) times daily. 30 g 5  . metoprolol succinate (TOPROL-XL) 25 MG 24 hr tablet TAKE ONE-HALF TABLET BY MOUTH ONCE DAILY 90 tablet 3  . warfarin (COUMADIN) 5 MG tablet Take 0.5 tablets (2.5 mg total) by mouth daily. (Patient taking differently: Take by mouth daily. As directed) 60 tablet 0   No current facility-administered medications for this visit.    Past Medical History  Diagnosis Date  . Hyperlipidemia   . Hypertension   . Arthritis   . Low back pain   . TIA (transient ischemic attack)   . Atrial fibrillation     sees [name]   . Hemorrhoids   . Cancer     ureteral, per Dr. Alinda Money     Past Surgical History  Procedure Laterality Date  . Appendectomy    . Tonsillectomy    . Hernia repair    . Nephrectomy    . Ureterectomy    . Cataract extraction, bilateral  2013    per [name]   . Colonoscopy  06-28-10    per [name], benign polyps and severe diverticulosis, no repeats needed     Family History  Problem Relation Age of Onset  . Coronary artery disease      fhx  . Stroke      fhx    History   Social History   . Marital Status: Married    Spouse Name: N/A    Number of Children: N/A  . Years of Education: N/A   Occupational History  . Not on file.   Social History Main Topics  . Smoking status: Never Smoker   . Smokeless tobacco: Never Used  . Alcohol Use: No  . Drug Use: No  . Sexual Activity: Not on file   Other Topics Concern  . Not on file   Social History Narrative    Review of Systems:  All systems reviewed.  They are negative to the above problem except as previously stated.  Vital Signs: BP 120/90 mmHg  Pulse 109  Ht 5\' 11"  (1.803 m)  Wt 201 lb 12.8 oz (91.536 kg)  BMI 28.16 kg/m2 Physical Exam Patient is in NAD HEENT:  Normocephalic, atraumatic. EOMI, PERRLA.  Neck: JVP is normal.  R carotid brut  Lungs: clear to auscultation. No rales no wheezes.  Heart: Irregular rate and rhythm. Normal S1, S2. No S3.   No significant murmurs. PMI not displaced.  Abdomen:  Supple, nontender. Normal bowel sounds. No masses. No  hepatomegaly.  Extremities:   Good distal pulses throughout. No lower extremity edema.  Musculoskeletal :moving all extremities.  Neuro:   alert and oriented x3.  CN II-XII grossly intact.  EKG  Afib 109   Assessment and Plan:  1.  Atrial fibrillation.  Continue rate control and coumadin.Would get holter to confirm rate control  EKG with resting rate above 10  2  Dyspneal  I am not convinced this represents angina.  Follow  3.  HTN  Diastolic is a little high  I would not make changes    4.  HL LDL was high on last check  He is achy on lipitor 4x per wk  I have recomm 5 mg 3x per wk  Follow up lipid in 8 wks  LDL needs to be tighter    5.  Carotid bruit   Mild plaquing in carotids  Stable  Needs tighter lipid control  F/U in 12 months. Stay active.

## 2014-07-21 NOTE — Patient Instructions (Signed)
Your physician has recommended you make the following change in your medication:  1.) stop lipitor 2.) start Crestor 10 mg tablets--take 1/2 tablet every MON, WED AND FRI Your physician recommends that you return for lab work in: [place] OF January. Your physician wants you to follow-up in: [place].  You will receive a reminder letter in the mail two months in advance. If you don't receive a letter, please call our office to schedule the follow-up appointment.

## 2014-07-22 ENCOUNTER — Other Ambulatory Visit: Payer: Self-pay | Admitting: Family Medicine

## 2014-08-01 ENCOUNTER — Other Ambulatory Visit: Payer: Self-pay | Admitting: Family

## 2014-08-02 ENCOUNTER — Telehealth: Payer: Self-pay | Admitting: *Deleted

## 2014-08-02 NOTE — Telephone Encounter (Signed)
Ok per [place], pharmacy notified

## 2014-08-02 NOTE — Telephone Encounter (Signed)
Fax received from pharmacy that warfarin is now a narrow therapeutic index drug and they need permission to change to a different mfg. They would like to change from [employer] to [employer].  [employer] is no longer making wafarin.  Please call Gwen Her

## 2014-08-15 ENCOUNTER — Ambulatory Visit (INDEPENDENT_AMBULATORY_CARE_PROVIDER_SITE_OTHER): Payer: Self-pay | Admitting: Family

## 2014-08-15 DIAGNOSIS — I482 Chronic atrial fibrillation, unspecified: Secondary | ICD-10-CM

## 2014-08-15 DIAGNOSIS — Z5181 Encounter for therapeutic drug level monitoring: Secondary | ICD-10-CM

## 2014-08-15 LAB — POCT INR: INR: 2.3

## 2014-08-15 NOTE — Patient Instructions (Signed)
Continue 5 mg MWF and 2.5mg  all other days. Recheck in 6 weeks.   Anticoagulation Dose Instructions as of 08/15/2014      Justin Gallegos Tue Wed Thu Fri Sat   New Dose 2.5 mg 5 mg 2.5 mg 5 mg 2.5 mg 5 mg 2.5 mg    Description        Continue 5 mg MWF and 2.5mg  all other days. Recheck in 6 weeks.

## 2014-08-19 ENCOUNTER — Telehealth: Payer: Self-pay | Admitting: Family Medicine

## 2014-08-19 DIAGNOSIS — I482 Chronic atrial fibrillation, unspecified: Secondary | ICD-10-CM

## 2014-08-19 DIAGNOSIS — L989 Disorder of the skin and subcutaneous tissue, unspecified: Secondary | ICD-10-CM

## 2014-08-19 DIAGNOSIS — C669 Malignant neoplasm of unspecified ureter: Secondary | ICD-10-CM

## 2014-08-19 NOTE — Telephone Encounter (Signed)
Patient is seeing Dr Dorris Carnes cardiologist on 09/07/14 and Dr Raynelle Bring [facility] in the future and he see Dr Marvel Plan at [facility]. He has [facility] insurance and needs a referral for all these MD's please.

## 2014-08-22 NOTE — Telephone Encounter (Signed)
Referrals were done

## 2014-08-23 NOTE — Telephone Encounter (Signed)
I left a message with below information.  

## 2014-09-07 ENCOUNTER — Other Ambulatory Visit (INDEPENDENT_AMBULATORY_CARE_PROVIDER_SITE_OTHER): Payer: Commercial Managed Care - HMO | Admitting: *Deleted

## 2014-09-07 DIAGNOSIS — I482 Chronic atrial fibrillation, unspecified: Secondary | ICD-10-CM

## 2014-09-07 DIAGNOSIS — E785 Hyperlipidemia, unspecified: Secondary | ICD-10-CM

## 2014-09-07 LAB — LIPID PANEL
Cholesterol: 176 mg/dL (ref 0–200)
HDL: 31.6 mg/dL — AB (ref >39.00)
NONHDL: 144.4
TRIGLYCERIDES: 278 mg/dL — AB (ref 0.0–149.0)
Total CHOL/HDL Ratio: 6
VLDL: 55.6 mg/dL — ABNORMAL HIGH (ref 0.0–40.0)

## 2014-09-07 LAB — LDL CHOLESTEROL, DIRECT: LDL DIRECT: 121 mg/dL

## 2014-09-16 ENCOUNTER — Ambulatory Visit: Payer: Commercial Managed Care - HMO | Admitting: Internal Medicine

## 2014-09-19 ENCOUNTER — Other Ambulatory Visit: Payer: Self-pay | Admitting: General Practice

## 2014-09-19 ENCOUNTER — Telehealth: Payer: Self-pay

## 2014-09-19 ENCOUNTER — Telehealth: Payer: Self-pay | Admitting: General Practice

## 2014-09-19 MED ORDER — WARFARIN SODIUM 5 MG PO TABS: ORAL_TABLET | ORAL | Status: DC

## 2014-09-19 NOTE — Telephone Encounter (Signed)
Rx request for:   fenofibrate 160 mg tablet Metoprolol tartrate 25 mg tablet  Pharm: [facility]   [place]:  please advise on Warfarin sodium 5 mg tablet

## 2014-09-20 ENCOUNTER — Other Ambulatory Visit: Payer: Self-pay

## 2014-09-20 ENCOUNTER — Other Ambulatory Visit: Payer: Self-pay | Admitting: General Practice

## 2014-09-20 MED ORDER — METOPROLOL SUCCINATE ER 25 MG PO TB24: ORAL_TABLET | ORAL | Status: DC

## 2014-09-20 MED ORDER — FENOFIBRATE 160 MG PO TABS: ORAL_TABLET | ORAL | Status: DC

## 2014-09-20 NOTE — Telephone Encounter (Signed)
Pharmacy has changed to [facility]. Refill requests for FENOFIBRATE 160MG  TABLET, METOPROLOL 25MG  TABLET, WARFARIN 5MG  TABLET

## 2014-09-20 NOTE — Telephone Encounter (Signed)
Faxed medication verification to [facility].

## 2014-09-20 NOTE — Telephone Encounter (Signed)
I sent 2 scripts e-scribe and Justin Gallegos had already refill the Coumadin on 09/19/14.

## 2014-09-22 MED ORDER — WARFARIN SODIUM 5 MG PO TABS: ORAL_TABLET | ORAL | Status: DC

## 2014-10-03 ENCOUNTER — Ambulatory Visit (INDEPENDENT_AMBULATORY_CARE_PROVIDER_SITE_OTHER): Payer: Commercial Managed Care - HMO | Admitting: General Practice

## 2014-10-03 DIAGNOSIS — Z5181 Encounter for therapeutic drug level monitoring: Secondary | ICD-10-CM

## 2014-10-03 LAB — POCT INR: INR: 2.3

## 2014-10-03 NOTE — Progress Notes (Signed)
Pre visit review using our clinic review tool, if applicable. No additional management support is needed unless otherwise documented below in the visit note. 

## 2014-10-04 ENCOUNTER — Telehealth: Payer: Self-pay

## 2014-10-04 NOTE — Telephone Encounter (Signed)
[facility] refill request for FENOFIBRATE 160MG , METOPROLOL 25MG , and WAFARIN 5MG  for 90 day supplies

## 2014-10-05 MED ORDER — FENOFIBRATE 160 MG PO TABS: ORAL_TABLET | ORAL | Status: DC

## 2014-10-05 MED ORDER — METOPROLOL SUCCINATE ER 25 MG PO TB24: ORAL_TABLET | ORAL | Status: DC

## 2014-10-05 NOTE — Addendum Note (Signed)
Addended by: Aggie Hacker A on: 10/05/2014 02:41 PM   Modules accepted: Orders

## 2014-10-05 NOTE — Telephone Encounter (Signed)
Pt needs refill on Warfarin to [facility] mail order. I sent scripts e-scribe for Fenofibrate & Metoprolol to mail order.

## 2014-10-06 ENCOUNTER — Other Ambulatory Visit: Payer: Self-pay | Admitting: General Practice

## 2014-10-06 MED ORDER — WARFARIN SODIUM 5 MG PO TABS: ORAL_TABLET | ORAL | Status: DC

## 2014-11-14 ENCOUNTER — Ambulatory Visit (INDEPENDENT_AMBULATORY_CARE_PROVIDER_SITE_OTHER): Payer: Commercial Managed Care - HMO | Admitting: General Practice

## 2014-11-14 DIAGNOSIS — Z5181 Encounter for therapeutic drug level monitoring: Secondary | ICD-10-CM | POA: Diagnosis not present

## 2014-11-14 LAB — POCT INR: INR: 2.4

## 2014-11-14 NOTE — Progress Notes (Signed)
Pre visit review using our clinic review tool, if applicable. No additional management support is needed unless otherwise documented below in the visit note. 

## 2014-12-06 ENCOUNTER — Other Ambulatory Visit: Payer: Self-pay | Admitting: Family Medicine

## 2014-12-26 ENCOUNTER — Ambulatory Visit (INDEPENDENT_AMBULATORY_CARE_PROVIDER_SITE_OTHER): Payer: Commercial Managed Care - HMO | Admitting: General Practice

## 2014-12-26 DIAGNOSIS — I4891 Unspecified atrial fibrillation: Secondary | ICD-10-CM

## 2014-12-26 DIAGNOSIS — Z5181 Encounter for therapeutic drug level monitoring: Secondary | ICD-10-CM | POA: Diagnosis not present

## 2014-12-26 LAB — POCT INR: INR: 2

## 2014-12-26 NOTE — Progress Notes (Signed)
Pre visit review using our clinic review tool, if applicable. No additional management support is needed unless otherwise documented below in the visit note. 

## 2015-01-10 ENCOUNTER — Telehealth: Payer: Self-pay | Admitting: Internal Medicine

## 2015-01-10 MED ORDER — ROSUVASTATIN CALCIUM 10 MG PO TABS: 5.0000 mg | ORAL_TABLET | ORAL | Status: DC

## 2015-01-10 NOTE — Telephone Encounter (Signed)
WAS GIVEN  CRESTOR SAMPLES AND NOW OUT-DOES HE NEED AN RX OR COME OFF OF IT-USES [facility] [address] [place] CALL [phone]

## 2015-01-10 NOTE — Telephone Encounter (Signed)
Spoke with pt and he states that he is out of his Crestor and needs a refill sent in. Verified pharmacy and pt asked that I send it in to the mail order pharmacy listed in chart. Informed pt that I would send this over. Pt verbalized understanding.

## 2015-01-19 ENCOUNTER — Other Ambulatory Visit: Payer: Self-pay | Admitting: *Deleted

## 2015-01-19 MED ORDER — ROSUVASTATIN CALCIUM 10 MG PO TABS: 5.0000 mg | ORAL_TABLET | ORAL | Status: DC

## 2015-01-20 ENCOUNTER — Other Ambulatory Visit: Payer: Self-pay | Admitting: *Deleted

## 2015-01-20 MED ORDER — ROSUVASTATIN CALCIUM 10 MG PO TABS: 5.0000 mg | ORAL_TABLET | ORAL | Status: DC

## 2015-02-03 ENCOUNTER — Other Ambulatory Visit: Payer: Self-pay | Admitting: Family Medicine

## 2015-02-06 ENCOUNTER — Ambulatory Visit (INDEPENDENT_AMBULATORY_CARE_PROVIDER_SITE_OTHER): Payer: Commercial Managed Care - HMO | Admitting: General Practice

## 2015-02-06 DIAGNOSIS — Z5181 Encounter for therapeutic drug level monitoring: Secondary | ICD-10-CM

## 2015-02-06 DIAGNOSIS — I4891 Unspecified atrial fibrillation: Secondary | ICD-10-CM

## 2015-02-06 LAB — POCT INR: INR: 2

## 2015-02-06 NOTE — Progress Notes (Signed)
Pre visit review using our clinic review tool, if applicable. No additional management support is needed unless otherwise documented below in the visit note. 

## 2015-03-20 ENCOUNTER — Ambulatory Visit (INDEPENDENT_AMBULATORY_CARE_PROVIDER_SITE_OTHER): Payer: Commercial Managed Care - HMO | Admitting: General Practice

## 2015-03-20 DIAGNOSIS — Z5181 Encounter for therapeutic drug level monitoring: Secondary | ICD-10-CM | POA: Diagnosis not present

## 2015-03-20 DIAGNOSIS — I4891 Unspecified atrial fibrillation: Secondary | ICD-10-CM

## 2015-03-20 LAB — POCT INR: INR: 2.1

## 2015-03-20 NOTE — Progress Notes (Signed)
Pre visit review using our clinic review tool, if applicable. No additional management support is needed unless otherwise documented below in the visit note. 

## 2015-04-05 ENCOUNTER — Other Ambulatory Visit: Payer: Self-pay | Admitting: Family Medicine

## 2015-04-06 ENCOUNTER — Telehealth: Payer: Self-pay | Admitting: Family Medicine

## 2015-04-06 NOTE — Telephone Encounter (Signed)
90 days of metoprolol sent to the pharmacy.  Pt due for yearly wellness in Sept 2016.  Will send a message to scheduling.

## 2015-04-06 NOTE — Telephone Encounter (Signed)
Pt scheduled  

## 2015-04-06 NOTE — Telephone Encounter (Signed)
This patient is due for his yearly wellness visit with [name] in Sept.  Please help the pt to make that appointment.  Thanks!

## 2015-04-10 ENCOUNTER — Other Ambulatory Visit: Payer: Self-pay | Admitting: General Practice

## 2015-04-10 MED ORDER — WARFARIN SODIUM 5 MG PO TABS
ORAL_TABLET | ORAL | Status: DC
Start: 2015-04-10 — End: 2015-11-20

## 2015-05-01 ENCOUNTER — Ambulatory Visit (INDEPENDENT_AMBULATORY_CARE_PROVIDER_SITE_OTHER): Payer: Commercial Managed Care - HMO | Admitting: General Practice

## 2015-05-01 DIAGNOSIS — Z5181 Encounter for therapeutic drug level monitoring: Secondary | ICD-10-CM | POA: Diagnosis not present

## 2015-05-01 DIAGNOSIS — I4891 Unspecified atrial fibrillation: Secondary | ICD-10-CM | POA: Diagnosis not present

## 2015-05-01 LAB — POCT INR: INR: 2.1

## 2015-05-01 NOTE — Progress Notes (Signed)
Pre visit review using our clinic review tool, if applicable. No additional management support is needed unless otherwise documented below in the visit note. 

## 2015-05-10 ENCOUNTER — Encounter: Payer: Self-pay | Admitting: Family Medicine

## 2015-05-10 ENCOUNTER — Ambulatory Visit (INDEPENDENT_AMBULATORY_CARE_PROVIDER_SITE_OTHER): Payer: Commercial Managed Care - HMO | Admitting: Family Medicine

## 2015-05-10 VITALS — BP 135/88 | HR 107 | Ht 71.0 in | Wt 202.0 lb

## 2015-05-10 DIAGNOSIS — Z0000 Encounter for general adult medical examination without abnormal findings: Secondary | ICD-10-CM | POA: Diagnosis not present

## 2015-05-10 DIAGNOSIS — Z125 Encounter for screening for malignant neoplasm of prostate: Secondary | ICD-10-CM

## 2015-05-10 DIAGNOSIS — Z23 Encounter for immunization: Secondary | ICD-10-CM

## 2015-05-10 DIAGNOSIS — R7989 Other specified abnormal findings of blood chemistry: Secondary | ICD-10-CM

## 2015-05-10 LAB — PSA: PSA: 1.14 ng/mL (ref 0.10–4.00)

## 2015-05-10 LAB — POCT URINALYSIS DIPSTICK
BILIRUBIN UA: NEGATIVE
Blood, UA: NEGATIVE
GLUCOSE UA: NEGATIVE
KETONES UA: NEGATIVE
LEUKOCYTES UA: NEGATIVE
Nitrite, UA: NEGATIVE
PH UA: 6
Protein, UA: NEGATIVE
Spec Grav, UA: 1.02
Urobilinogen, UA: 0.2

## 2015-05-10 LAB — HEPATIC FUNCTION PANEL
ALBUMIN: 4 g/dL (ref 3.5–5.2)
ALK PHOS: 57 U/L (ref 39–117)
ALT: 17 U/L (ref 0–53)
AST: 23 U/L (ref 0–37)
BILIRUBIN DIRECT: 0.2 mg/dL (ref 0.0–0.3)
Total Bilirubin: 0.9 mg/dL (ref 0.2–1.2)
Total Protein: 6.4 g/dL (ref 6.0–8.3)

## 2015-05-10 LAB — CBC WITH DIFFERENTIAL/PLATELET
BASOS PCT: 0.3 % (ref 0.0–3.0)
Basophils Absolute: 0 10*3/uL (ref 0.0–0.1)
EOS ABS: 0.1 10*3/uL (ref 0.0–0.7)
Eosinophils Relative: 0.9 % (ref 0.0–5.0)
HCT: 52 % (ref 39.0–52.0)
Hemoglobin: 17.6 g/dL — ABNORMAL HIGH (ref 13.0–17.0)
LYMPHS ABS: 2.5 10*3/uL (ref 0.7–4.0)
Lymphocytes Relative: 36 % (ref 12.0–46.0)
MCHC: 33.9 g/dL (ref 30.0–36.0)
MCV: 93.8 fl (ref 78.0–100.0)
MONO ABS: 0.9 10*3/uL (ref 0.1–1.0)
Monocytes Relative: 12.6 % — ABNORMAL HIGH (ref 3.0–12.0)
NEUTROS ABS: 3.4 10*3/uL (ref 1.4–7.7)
Neutrophils Relative %: 50.2 % (ref 43.0–77.0)
PLATELETS: 161 10*3/uL (ref 150.0–400.0)
RBC: 5.54 Mil/uL (ref 4.22–5.81)
RDW: 14.1 % (ref 11.5–15.5)
WBC: 6.8 10*3/uL (ref 4.0–10.5)

## 2015-05-10 LAB — BASIC METABOLIC PANEL
BUN: 27 mg/dL — AB (ref 6–23)
CHLORIDE: 106 meq/L (ref 96–112)
CO2: 28 mEq/L (ref 19–32)
Calcium: 9.2 mg/dL (ref 8.4–10.5)
Creatinine, Ser: 1.76 mg/dL — ABNORMAL HIGH (ref 0.40–1.50)
GFR: 39.36 mL/min — ABNORMAL LOW (ref >60.00)
Glucose, Bld: 89 mg/dL (ref 70–99)
POTASSIUM: 5.2 meq/L — AB (ref 3.5–5.1)
SODIUM: 141 meq/L (ref 135–145)

## 2015-05-10 LAB — LIPID PANEL
CHOL/HDL RATIO: 5
CHOLESTEROL: 174 mg/dL (ref 0–200)
HDL: 37.4 mg/dL — ABNORMAL LOW (ref >39.00)
NonHDL: 136.42
Triglycerides: 209 mg/dL — ABNORMAL HIGH (ref 0.0–149.0)
VLDL: 41.8 mg/dL — ABNORMAL HIGH (ref 0.0–40.0)

## 2015-05-10 LAB — LDL CHOLESTEROL, DIRECT: LDL DIRECT: 124 mg/dL

## 2015-05-10 LAB — TSH: TSH: 1.86 u[IU]/mL (ref 0.35–4.50)

## 2015-05-10 MED ORDER — METOPROLOL SUCCINATE ER 25 MG PO TB24: 12.5000 mg | ORAL_TABLET | Freq: Every day | ORAL | Status: DC

## 2015-05-10 MED ORDER — FENOFIBRATE 160 MG PO TABS: 160.0000 mg | ORAL_TABLET | Freq: Every day | ORAL | Status: DC

## 2015-05-10 MED ORDER — ROSUVASTATIN CALCIUM 10 MG PO TABS: 5.0000 mg | ORAL_TABLET | ORAL | Status: DC

## 2015-05-10 NOTE — Progress Notes (Signed)
Pre visit review using our clinic review tool, if applicable. No additional management support is needed unless otherwise documented below in the visit note. 

## 2015-05-10 NOTE — Progress Notes (Signed)
   Subjective:    Patient ID: Justin Gallegos, male    DOB: 09/12/1928, 81 y.o.   MRN: [id number]  HPI 81 yr old male for a cpx. He feels well and has no complaints. He gets regular INR checks and he sees [name] once a year. He works with a Physiological scientist twice a week.    Review of Systems  Constitutional: Negative.   HENT: Negative.   Eyes: Negative.   Respiratory: Negative.   Cardiovascular: Negative.   Gastrointestinal: Negative.   Genitourinary: Negative.   Musculoskeletal: Negative.   Skin: Negative.   Neurological: Negative.   Psychiatric/Behavioral: Negative.        Objective:   Physical Exam  Constitutional: He is oriented to person, place, and time. He appears well-developed and well-nourished. No distress.  HENT:  Head: Normocephalic and atraumatic.  Right Ear: External ear normal.  Left Ear: External ear normal.  Nose: Nose normal.  Mouth/Throat: Oropharynx is clear and moist. No oropharyngeal exudate.  Eyes: Conjunctivae and EOM are normal. Pupils are equal, round, and reactive to light. Right eye exhibits no discharge. Left eye exhibits no discharge. No scleral icterus.  Neck: Neck supple. No JVD present. No tracheal deviation present. No thyromegaly present.  Cardiovascular: Normal rate, normal heart sounds and intact distal pulses.  Exam reveals no gallop and no friction rub.   No murmur heard. Irregular rhythm  Pulmonary/Chest: Effort normal and breath sounds normal. No respiratory distress. He has no wheezes. He has no rales. He exhibits no tenderness.  Abdominal: Soft. Bowel sounds are normal. He exhibits no distension and no mass. There is no tenderness. There is no rebound and no guarding.  Genitourinary: Rectum normal, prostate normal and penis normal. Guaiac negative stool. No penile tenderness.  Musculoskeletal: Normal range of motion. He exhibits no edema or tenderness.  Lymphadenopathy:    He has no cervical adenopathy.  Neurological: He is  alert and oriented to person, place, and time. He has normal reflexes. No cranial nerve deficit. He exhibits normal muscle tone. Coordination normal.  Skin: Skin is warm and dry. No rash noted. He is not diaphoretic. No erythema. No pallor.  Psychiatric: He has a normal mood and affect. His behavior is normal. Judgment and thought content normal.          Assessment & Plan:  Well exam. We discussed diet and exercise advice. Get fasting labs.

## 2015-06-12 ENCOUNTER — Ambulatory Visit (INDEPENDENT_AMBULATORY_CARE_PROVIDER_SITE_OTHER): Payer: Commercial Managed Care - HMO | Admitting: General Practice

## 2015-06-12 DIAGNOSIS — Z5181 Encounter for therapeutic drug level monitoring: Secondary | ICD-10-CM

## 2015-06-12 LAB — POCT INR: INR: 1.7

## 2015-06-12 NOTE — Progress Notes (Signed)
Pre visit review using our clinic review tool, if applicable. No additional management support is needed unless otherwise documented below in the visit note. 

## 2015-07-21 ENCOUNTER — Ambulatory Visit (INDEPENDENT_AMBULATORY_CARE_PROVIDER_SITE_OTHER): Payer: Commercial Managed Care - HMO | Admitting: Internal Medicine

## 2015-07-21 ENCOUNTER — Encounter: Payer: Self-pay | Admitting: Internal Medicine

## 2015-07-21 VITALS — BP 132/84 | HR 93 | Ht 71.0 in | Wt 202.8 lb

## 2015-07-21 DIAGNOSIS — I10 Essential (primary) hypertension: Secondary | ICD-10-CM

## 2015-07-21 MED ORDER — ROSUVASTATIN CALCIUM 20 MG PO TABS: 10.0000 mg | ORAL_TABLET | Freq: Every day | ORAL | Status: DC

## 2015-07-21 NOTE — Patient Instructions (Signed)
Your physician has recommended you make the following change in your medication:  1.) increase crestor to 10 mg daily  Your physician wants you to follow-up in: 1 year with [name].  You will receive a reminder letter in the mail two months in advance. If you don't receive a letter, please call our office to schedule the follow-up appointment.

## 2015-07-21 NOTE — Progress Notes (Signed)
Cardiology Office Note   Date:  07/21/2015   ID:  Justin, Gallegos [birth year], MRN [id number]  PCP:  Laurey Morale, MD  Cardiologist:   Dorris Carnes, MD   No chief complaint on file.     History of Present Illness: Justin Gallegos is a 81 y.o. male with a history of atrial fib and HL  I saw him in Dec 2015   Since seen he has been doing OK  Brerathing OK  No CP  No palpitations.  No change in his activities         Current Outpatient Prescriptions  Medication Sig Dispense Refill  . fenofibrate 160 MG tablet Take 1 tablet (160 mg total) by mouth daily. 90 tablet 3  . fish oil-omega-3 fatty acids 1000 MG capsule Take 1 g by mouth daily.      . fluticasone (FLONASE) 50 MCG/ACT nasal spray USE TWO SPRAYS IN EACH NOSTRIL AS NEEDED 16 g 0  . ketoconazole (NIZORAL) 2 % cream Apply 1 application topically 2 (two) times daily. (Patient taking differently: Apply 1 application topically as needed (RASH). ) 30 g 5  . metoprolol succinate (TOPROL-XL) 25 MG 24 hr tablet Take 0.5 tablets (12.5 mg total) by mouth daily. 45 tablet 3  . rosuvastatin (CRESTOR) 10 MG tablet Take 0.5 tablets (5 mg total) by mouth every other day. 45 tablet 3  . warfarin (COUMADIN) 5 MG tablet Take as directed by anticoagulation clinic 90 tablet 1   No current facility-administered medications for this visit.    Allergies:   Amoxicillin-pot clavulanate   Past Medical History  Diagnosis Date  . Hyperlipidemia   . Hypertension   . Arthritis   . Low back pain   . TIA (transient ischemic attack)   . Atrial fibrillation [facility])     sees [name]   . Hemorrhoids   . Cancer [facility])     ureteral, per Dr. Alinda Money     Past Surgical History  Procedure Laterality Date  . Appendectomy    . Tonsillectomy    . Hernia repair    . Nephrectomy    . Ureterectomy    . Cataract extraction, bilateral  2013    per [name]   . Colonoscopy  06-28-10    per [name], benign polyps and severe diverticulosis,  no repeats needed      Social History:  The patient  reports that he has never smoked. He has never used smokeless tobacco. He reports that he does not drink alcohol or use illicit drugs.   Family History:  The patient's family history includes Coronary artery disease in an other family member; Stroke in his father and another family member.    ROS:  Please see the history of present illness. All other systems are reviewed and  Negative to the above problem except as noted.    PHYSICAL EXAM: VS:  BP 132/84 mmHg  Pulse 93  Ht 5\' 11"  (1.803 m)  Wt 91.989 kg (202 lb 12.8 oz)  BMI 28.30 kg/m2  GEN: Well nourished, well developed, in no acute distress HEENT: normal Neck: no JVD, carotid bruits, or masses Cardiac:  Irreg irreg; no murmurs, rubs, or gallops,no edema  Respiratory:  clear to auscultation bilaterally, normal work of breathing GI: soft, nontender, nondistended, + BS  No hepatomegaly  MS: no deformity Moving all extremities   Skin: warm and dry, no rash Neuro:  Strength and sensation are intact Psych: euthymic mood, full  affect   EKG:  EKG is ordered today.  Atrial fib  93 bpm  T wave inversion III, AVF, V5/ V6.   Lipid Panel    Component Value Date/Time   CHOL 174 05/10/2015 1057   TRIG 209.0* 05/10/2015 1057   TRIG 178* 07/24/2006 0829   HDL 37.40* 05/10/2015 1057   CHOLHDL 5 05/10/2015 1057   CHOLHDL 4.2 CALC 07/24/2006 0829   VLDL 41.8* 05/10/2015 1057   LDLCALC 143* 05/11/2014 0952   LDLDIRECT 124.0 05/10/2015 1057      Wt Readings from Last 3 Encounters:  07/21/15 91.989 kg (202 lb 12.8 oz)  05/10/15 91.627 kg (202 lb)  07/21/14 91.536 kg (201 lb 12.8 oz)      ASSESSMENT AND PLAN:  1  Atrial fib  Continue rate control  Currently on coumadin  Discussed NOACs  Pt to review with insurance  Call  2HL  LDL coulp be lower  I would recomm increasing lipitor to 20 mg   Take 1/2 every other day  F/U with labs this spring  F/U in 1 year       Signed, Dorris Carnes, MD  07/21/2015 [place], [place],   [address] Phone: [phone]; Fax: [phone]

## 2015-07-24 ENCOUNTER — Ambulatory Visit (INDEPENDENT_AMBULATORY_CARE_PROVIDER_SITE_OTHER): Payer: Commercial Managed Care - HMO | Admitting: General Practice

## 2015-07-24 DIAGNOSIS — Z5181 Encounter for therapeutic drug level monitoring: Secondary | ICD-10-CM | POA: Diagnosis not present

## 2015-07-24 LAB — POCT INR: INR: 2.1

## 2015-07-24 NOTE — Progress Notes (Signed)
Pre visit review using our clinic review tool, if applicable. No additional management support is needed unless otherwise documented below in the visit note. 

## 2015-08-02 ENCOUNTER — Other Ambulatory Visit: Payer: Self-pay | Admitting: Internal Medicine

## 2015-08-02 DIAGNOSIS — I6523 Occlusion and stenosis of bilateral carotid arteries: Secondary | ICD-10-CM

## 2015-08-17 ENCOUNTER — Ambulatory Visit (HOSPITAL_COMMUNITY)
Admission: RE | Admit: 2015-08-17 | Discharge: 2015-08-17 | Disposition: A | Discharge status: Home / Self Care | Payer: PPO | Source: Ambulatory Visit | Attending: Cardiology | Admitting: Cardiology

## 2015-08-17 DIAGNOSIS — I499 Cardiac arrhythmia, unspecified: Secondary | ICD-10-CM | POA: Diagnosis not present

## 2015-08-17 DIAGNOSIS — I10 Essential (primary) hypertension: Secondary | ICD-10-CM | POA: Insufficient documentation

## 2015-08-17 DIAGNOSIS — I6523 Occlusion and stenosis of bilateral carotid arteries: Secondary | ICD-10-CM | POA: Insufficient documentation

## 2015-08-17 DIAGNOSIS — E785 Hyperlipidemia, unspecified: Secondary | ICD-10-CM | POA: Diagnosis not present

## 2015-08-25 ENCOUNTER — Ambulatory Visit (INDEPENDENT_AMBULATORY_CARE_PROVIDER_SITE_OTHER): Payer: PPO | Admitting: Family Medicine

## 2015-08-25 ENCOUNTER — Encounter: Payer: Self-pay | Admitting: Family Medicine

## 2015-08-25 VITALS — BP 130/88 | HR 103 | Temp 98.0°F | Ht 71.0 in | Wt 202.0 lb

## 2015-08-25 DIAGNOSIS — J209 Acute bronchitis, unspecified: Secondary | ICD-10-CM | POA: Diagnosis not present

## 2015-08-25 MED ORDER — AZITHROMYCIN 250 MG PO TABS: ORAL_TABLET | ORAL | Status: DC

## 2015-08-25 MED ORDER — HYDROCODONE-HOMATROPINE 5-1.5 MG/5ML PO SYRP: 5.0000 mL | ORAL_SOLUTION | ORAL | Status: DC | PRN

## 2015-08-25 NOTE — Progress Notes (Signed)
   Subjective:    Patient ID: Justin Gallegos, male    DOB: Oct 15, 1928, 82 y.o.   MRN: [id number]  HPI Here for one week of PND, chest tightness and coughing up yellow sputum. No fever.    Review of Systems  Constitutional: Negative.   HENT: Positive for congestion and postnasal drip. Negative for ear pain, sinus pressure and sore throat.   Eyes: Negative.   Respiratory: Positive for cough and chest tightness.        Objective:   Physical Exam  Constitutional: He appears well-developed and well-nourished.  HENT:  Right Ear: External ear normal.  Left Ear: External ear normal.  Nose: Nose normal.  Mouth/Throat: Oropharynx is clear and moist.  Eyes: Conjunctivae are normal.  Neck: No thyromegaly present.  Pulmonary/Chest: Effort normal. No respiratory distress. He has no wheezes. He has no rales.  Scattered rhonchi   Lymphadenopathy:    He has no cervical adenopathy.          Assessment & Plan:  Bronchitis, treat with a Zpack.

## 2015-08-25 NOTE — Progress Notes (Signed)
Pre visit review using our clinic review tool, if applicable. No additional management support is needed unless otherwise documented below in the visit note. 

## 2015-08-28 ENCOUNTER — Ambulatory Visit (INDEPENDENT_AMBULATORY_CARE_PROVIDER_SITE_OTHER): Payer: PPO | Admitting: General Practice

## 2015-08-28 DIAGNOSIS — I4891 Unspecified atrial fibrillation: Secondary | ICD-10-CM | POA: Diagnosis not present

## 2015-08-28 DIAGNOSIS — Z5181 Encounter for therapeutic drug level monitoring: Secondary | ICD-10-CM

## 2015-08-28 LAB — POCT INR: INR: 2.1

## 2015-08-30 DIAGNOSIS — H524 Presbyopia: Secondary | ICD-10-CM | POA: Diagnosis not present

## 2015-08-30 DIAGNOSIS — Z961 Presence of intraocular lens: Secondary | ICD-10-CM | POA: Diagnosis not present

## 2015-08-30 DIAGNOSIS — H5201 Hypermetropia, right eye: Secondary | ICD-10-CM | POA: Diagnosis not present

## 2015-08-30 DIAGNOSIS — H52223 Regular astigmatism, bilateral: Secondary | ICD-10-CM | POA: Diagnosis not present

## 2015-08-30 DIAGNOSIS — Z9849 Cataract extraction status, unspecified eye: Secondary | ICD-10-CM | POA: Diagnosis not present

## 2015-09-04 ENCOUNTER — Ambulatory Visit: Payer: Commercial Managed Care - HMO

## 2015-10-09 ENCOUNTER — Ambulatory Visit (INDEPENDENT_AMBULATORY_CARE_PROVIDER_SITE_OTHER): Payer: PPO | Admitting: General Practice

## 2015-10-09 DIAGNOSIS — I4891 Unspecified atrial fibrillation: Secondary | ICD-10-CM

## 2015-10-09 DIAGNOSIS — Z5181 Encounter for therapeutic drug level monitoring: Secondary | ICD-10-CM | POA: Diagnosis not present

## 2015-10-09 LAB — POCT INR: INR: 2.3

## 2015-10-09 NOTE — Progress Notes (Signed)
Pre visit review using our clinic review tool, if applicable. No additional management support is needed unless otherwise documented below in the visit note. 

## 2015-11-20 ENCOUNTER — Ambulatory Visit (INDEPENDENT_AMBULATORY_CARE_PROVIDER_SITE_OTHER): Payer: PPO | Admitting: General Practice

## 2015-11-20 ENCOUNTER — Other Ambulatory Visit: Payer: Self-pay | Admitting: General Practice

## 2015-11-20 DIAGNOSIS — Z5181 Encounter for therapeutic drug level monitoring: Secondary | ICD-10-CM | POA: Diagnosis not present

## 2015-11-20 DIAGNOSIS — I4891 Unspecified atrial fibrillation: Secondary | ICD-10-CM | POA: Diagnosis not present

## 2015-11-20 LAB — POCT INR: INR: 2.7

## 2015-11-20 MED ORDER — WARFARIN SODIUM 5 MG PO TABS: ORAL_TABLET | ORAL | Status: DC

## 2015-11-20 MED ORDER — METOPROLOL SUCCINATE ER 25 MG PO TB24: 12.5000 mg | ORAL_TABLET | Freq: Every day | ORAL | Status: DC

## 2015-11-20 MED ORDER — ROSUVASTATIN CALCIUM 10 MG PO TABS: 10.0000 mg | ORAL_TABLET | Freq: Every day | ORAL | Status: DC

## 2015-11-20 NOTE — Progress Notes (Signed)
Pre visit review using our clinic review tool, if applicable. No additional management support is needed unless otherwise documented below in the visit note. 

## 2016-01-01 ENCOUNTER — Ambulatory Visit (INDEPENDENT_AMBULATORY_CARE_PROVIDER_SITE_OTHER): Payer: PPO | Admitting: General Practice

## 2016-01-01 DIAGNOSIS — Z5181 Encounter for therapeutic drug level monitoring: Secondary | ICD-10-CM

## 2016-01-01 DIAGNOSIS — I4891 Unspecified atrial fibrillation: Secondary | ICD-10-CM | POA: Diagnosis not present

## 2016-01-01 LAB — POCT INR: INR: 2.4

## 2016-01-01 NOTE — Progress Notes (Signed)
Pre visit review using our clinic review tool, if applicable. No additional management support is needed unless otherwise documented below in the visit note. 

## 2016-02-19 ENCOUNTER — Ambulatory Visit (INDEPENDENT_AMBULATORY_CARE_PROVIDER_SITE_OTHER): Payer: PPO | Admitting: General Practice

## 2016-02-19 ENCOUNTER — Other Ambulatory Visit: Payer: Self-pay | Admitting: General Practice

## 2016-02-19 DIAGNOSIS — I4891 Unspecified atrial fibrillation: Secondary | ICD-10-CM

## 2016-02-19 DIAGNOSIS — Z5181 Encounter for therapeutic drug level monitoring: Secondary | ICD-10-CM | POA: Diagnosis not present

## 2016-02-19 LAB — POCT INR: INR: 2.5

## 2016-02-19 MED ORDER — FENOFIBRATE 160 MG PO TABS: 160.0000 mg | ORAL_TABLET | Freq: Every day | ORAL | Status: DC

## 2016-02-19 NOTE — Progress Notes (Signed)
I agree with this plan.

## 2016-02-19 NOTE — Progress Notes (Signed)
Pre visit review using our clinic review tool, if applicable. No additional management support is needed unless otherwise documented below in the visit note. 

## 2016-03-11 ENCOUNTER — Ambulatory Visit (INDEPENDENT_AMBULATORY_CARE_PROVIDER_SITE_OTHER): Payer: PPO | Admitting: Family Medicine

## 2016-03-11 ENCOUNTER — Encounter: Payer: Self-pay | Admitting: Family Medicine

## 2016-03-11 VITALS — BP 138/88 | HR 102 | Temp 97.9°F | Ht 71.0 in | Wt 205.0 lb

## 2016-03-11 DIAGNOSIS — I10 Essential (primary) hypertension: Secondary | ICD-10-CM | POA: Diagnosis not present

## 2016-03-11 DIAGNOSIS — E785 Hyperlipidemia, unspecified: Secondary | ICD-10-CM | POA: Diagnosis not present

## 2016-03-11 DIAGNOSIS — R29898 Other symptoms and signs involving the musculoskeletal system: Secondary | ICD-10-CM | POA: Diagnosis not present

## 2016-03-11 NOTE — Progress Notes (Signed)
Pre visit review using our clinic review tool, if applicable. No additional management support is needed unless otherwise documented below in the visit note. 

## 2016-03-11 NOTE — Progress Notes (Signed)
   Subjective:    Patient ID: Justin Gallegos, male    DOB: 1929-07-19, 82 y.o.   MRN: [id number]  HPI Here to talk about muscle weakness, especially in the legs. This started a few months ago not long after he was switched from Lipitor to Crestor by [name]. He is now using a cane to steady himself when he walks. There is no pain involved. His BP is stable at home.    Review of Systems  Respiratory: Negative.   Cardiovascular: Negative.   Neurological: Positive for weakness. Negative for numbness.       Objective:   Physical Exam  Constitutional: He is oriented to person, place, and time. He appears well-developed and well-nourished.  Cardiovascular: Normal rate, normal heart sounds and intact distal pulses.   No murmur heard. Irregular rhythm   Pulmonary/Chest: Effort normal and breath sounds normal.  Musculoskeletal: He exhibits no edema.  Neurological: He is alert and oriented to person, place, and time.          Assessment & Plan:  Weakness especially in the larger muscles. I agree this could be a side effect of his statin. He will stop the Crestor for one month and see if it goes away. Laurey Morale, MD

## 2016-04-01 ENCOUNTER — Ambulatory Visit (INDEPENDENT_AMBULATORY_CARE_PROVIDER_SITE_OTHER): Payer: PPO | Admitting: General Practice

## 2016-04-01 DIAGNOSIS — Z5181 Encounter for therapeutic drug level monitoring: Secondary | ICD-10-CM

## 2016-04-01 DIAGNOSIS — I4891 Unspecified atrial fibrillation: Secondary | ICD-10-CM

## 2016-04-01 LAB — POCT INR: INR: 1.9

## 2016-04-17 ENCOUNTER — Encounter: Payer: Self-pay | Admitting: Family Medicine

## 2016-04-17 ENCOUNTER — Ambulatory Visit (INDEPENDENT_AMBULATORY_CARE_PROVIDER_SITE_OTHER): Payer: PPO | Admitting: Family Medicine

## 2016-04-17 VITALS — BP 138/88 | HR 100 | Temp 98.3°F | Ht 71.0 in | Wt 205.0 lb

## 2016-04-17 DIAGNOSIS — I10 Essential (primary) hypertension: Secondary | ICD-10-CM

## 2016-04-17 DIAGNOSIS — R531 Weakness: Secondary | ICD-10-CM | POA: Diagnosis not present

## 2016-04-17 DIAGNOSIS — Z23 Encounter for immunization: Secondary | ICD-10-CM

## 2016-04-17 DIAGNOSIS — E785 Hyperlipidemia, unspecified: Secondary | ICD-10-CM

## 2016-04-17 MED ORDER — ATORVASTATIN CALCIUM 40 MG PO TABS
40.0000 mg | ORAL_TABLET | Freq: Every day | ORAL | 3 refills | Status: DC
Start: 2016-04-17 — End: 2017-04-14

## 2016-04-17 NOTE — Progress Notes (Signed)
   Subjective:    Patient ID: Justin Gallegos, male    DOB: [birth year], 82 y.o.   MRN: [id number]  HPI Here to follow up on muscular weakness, especially in the legs. We saw him in late July for this, and at that time he was so weak he needed a cane to walk. We felt this could be a side effect of the Crestor so I asked him to stop taking it. Sure enough, the weakness totally resolved within the first week. He now feels great and he no longer uses a cane.    Review of Systems  Constitutional: Negative.   Respiratory: Negative.   Cardiovascular: Negative.   Musculoskeletal: Negative.   Neurological: Negative.        Objective:   Physical Exam  Constitutional: He is oriented to person, place, and time. He appears well-developed and well-nourished.  Normal gait, he gets up and down from the chair easily   Cardiovascular: Normal rate, regular rhythm, normal heart sounds and intact distal pulses.   Pulmonary/Chest: Effort normal and breath sounds normal.  Neurological: He is alert and oriented to person, place, and time.          Assessment & Plan:  Transient muscle weakness as a side effect of Crestor, now resolved. He seemed to tolerate Lipitor well, so we will start him back on Lipitor 40 mg a day. Recheck labs in 90 days. Laurey Morale, MD

## 2016-04-17 NOTE — Progress Notes (Signed)
Pre visit review using our clinic review tool, if applicable. No additional management support is needed unless otherwise documented below in the visit note. 

## 2016-05-13 ENCOUNTER — Ambulatory Visit (INDEPENDENT_AMBULATORY_CARE_PROVIDER_SITE_OTHER): Payer: PPO | Admitting: General Practice

## 2016-05-13 DIAGNOSIS — Z5181 Encounter for therapeutic drug level monitoring: Secondary | ICD-10-CM | POA: Diagnosis not present

## 2016-05-13 DIAGNOSIS — I4891 Unspecified atrial fibrillation: Secondary | ICD-10-CM | POA: Diagnosis not present

## 2016-05-13 LAB — POCT INR: INR: 2.4

## 2016-05-16 ENCOUNTER — Other Ambulatory Visit: Payer: Self-pay | Admitting: Family Medicine

## 2016-06-21 ENCOUNTER — Ambulatory Visit (INDEPENDENT_AMBULATORY_CARE_PROVIDER_SITE_OTHER): Payer: PPO | Admitting: Family Medicine

## 2016-06-21 ENCOUNTER — Encounter: Payer: Self-pay | Admitting: Family Medicine

## 2016-06-21 ENCOUNTER — Ambulatory Visit: Payer: PPO | Admitting: Family Medicine

## 2016-06-21 VITALS — BP 100/80 | HR 89 | Temp 97.4°F | Ht 71.0 in | Wt 207.3 lb

## 2016-06-21 DIAGNOSIS — R109 Unspecified abdominal pain: Secondary | ICD-10-CM

## 2016-06-21 DIAGNOSIS — R10A0 Flank pain, unspecified side: Secondary | ICD-10-CM

## 2016-06-21 MED ORDER — TIZANIDINE HCL 2 MG PO TABS: 2.0000 mg | ORAL_TABLET | Freq: Three times a day (TID) | ORAL | 0 refills | Status: DC | PRN

## 2016-06-21 NOTE — Patient Instructions (Addendum)
BEFORE YOU LEAVE: -follow up: Please ensure that you have follow-up in about 2 weeks  Can try the muscle relaxer (zanaflex)and Tylenol per instructions as needed for pain. Gentle stretching.  Follow-up sooner if symptoms are worsening, new symptoms arise or your symptoms are not resolving with treatment.

## 2016-06-21 NOTE — Progress Notes (Signed)
Pre visit review using our clinic review tool, if applicable. No additional management support is needed unless otherwise documented below in the visit note. 

## 2016-06-21 NOTE — Progress Notes (Signed)
HPI:  Justin Gallegos is a pleasant 82 year old here for an acute visit for right side pain. He thinks he tweaked the muscles in this area about 1 week ago. He has had mild pain in this area only with certain movements of the hip since. Denies radiation of pain, malaise, weakness, numbness, changes in his bowels, dysuria, hematuria, fevers, nausea, vomiting, diarrhea, constipation or weight loss. He has had several hernia surgeries and a remote appendectomy. Denies any history of kidney stones. 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  ROS: See pertinent positives and negatives per HPI.  Past Medical History:  Diagnosis Date  . Arthritis   . Atrial fibrillation [facility])    sees Dr. Dorris Gallegos   . Cancer [facility])    ureteral, per Dr. Alinda Gallegos   . Hemorrhoids   . Hyperlipidemia   . Hypertension   . Low back pain   . TIA (transient ischemic attack)     Past Surgical History:  Procedure Laterality Date  . APPENDECTOMY    . CATARACT EXTRACTION, BILATERAL  2013   per Dr. Katy Gallegos   . COLONOSCOPY  06-28-10   per Dr. Carlean Gallegos, benign polyps and severe diverticulosis, no repeats needed   . HERNIA REPAIR    . NEPHRECTOMY    . TONSILLECTOMY    . URETERECTOMY      Family History  Problem Relation Age of Onset  . Coronary artery disease      fhx  . Stroke      fhx  . Stroke Father     Social History   Social History  . Marital status: Married    Spouse name: N/A  . Number of children: N/A  . Years of education: N/A   Social History Main Topics  . Smoking status: Never Smoker  . Smokeless tobacco: Never Used  . Alcohol use No  . Drug use: No  . Sexual activity: Not Asked   Other Topics Concern  . None   Social History Narrative  . None     Current Outpatient Prescriptions:  .  atorvastatin (LIPITOR) 40 MG tablet, Take 1 tablet (40 mg total) by mouth daily., Disp: 90 tablet, Rfl: 3 .  fenofibrate 160 MG tablet, Take 1 tablet (160 mg total) by mouth daily., Disp: 90 tablet, Rfl:  1 .  fish oil-omega-3 fatty acids 1000 MG capsule, Take 1 g by mouth daily.  , Disp: , Rfl:  .  fluticasone (FLONASE) 50 MCG/ACT nasal spray, USE TWO SPRAYS IN EACH NOSTRIL AS NEEDED, Disp: 16 g, Rfl: 0 .  metoprolol succinate (TOPROL-XL) 25 MG 24 hr tablet, TAKE ONE-HALF TABLET BY MOUTH ONCE DAILY, Disp: 45 tablet, Rfl: 1 .  warfarin (COUMADIN) 5 MG tablet, Take as directed by anticoagulation clinic, Disp: 90 tablet, Rfl: 1 .  tiZANidine (ZANAFLEX) 2 MG tablet, Take 1 tablet (2 mg total) by mouth every 8 (eight) hours as needed for muscle spasms., Disp: 20 tablet, Rfl: 0  EXAM:  Vitals:   06/21/16 1333  BP: 100/80  Pulse: 89  Temp: 97.4 F (36.3 C)    Body mass index is 28.91 kg/m.  GENERAL: vitals reviewed and listed above, alert, oriented, appears well hydrated and in no acute distress  HEENT: atraumatic, conjunttiva clear, no obvious abnormalities on inspection of external nose and ears  NECK: no obvious masses on inspection  LUNGS: clear to auscultation bilaterally, no wheezes, rales or rhonchi, good air movement  CV: HRRR, no peripheral edema  ABD: Mild tenderness to  palpation in the abdominal wall muscles over the lateral right iliac crest, no bony tenderness to palpation, this pain seems to worsen with contraction of these muscles, no other abdominal tenderness to palpation, no rebound or guarding, no CVA tenderness to palpation  MS: moves all extremities without noticeable abnormality, negative hip compression tests, does not seem to have pain over the right hip socket, normal range of motion of the right hip  PSYCH: pleasant and cooperative, no obvious depression or anxiety  ASSESSMENT AND PLAN:  Discussed the following assessment and plan:  Flank pain  -we discussed possible serious and likely etiologies, workup and treatment, treatment risks and return precautions, a CT of the abdomen and pelvis would likely be the next step to evaluate this further, but his  symptoms seem mild and seem to be be isolated to the abdominal wall muscles and he prefers to avoid imaging if possible -after this discussion, Justin Gallegos opted for a trial of muscle relaxer, Tylenol as needed for pain and gentle stretching with close follow-up for reevaluation -of course, we advised Justin Gallegos  to return or notify a doctor immediately if symptoms worsen or persist or new concerns arise.  Patient Instructions  BEFORE YOU LEAVE: -follow up: Please ensure that you have follow-up in about 2 weeks  Can try the muscle relaxer (zanaflex)and Tylenol per instructions as needed for pain. Gentle stretching.  Follow-up sooner if symptoms are worsening, new symptoms arise or your symptoms are not resolving with treatment.    Justin Gallegos R., DO

## 2016-06-24 ENCOUNTER — Ambulatory Visit (INDEPENDENT_AMBULATORY_CARE_PROVIDER_SITE_OTHER): Payer: PPO

## 2016-06-24 DIAGNOSIS — Z5181 Encounter for therapeutic drug level monitoring: Secondary | ICD-10-CM | POA: Diagnosis not present

## 2016-06-24 DIAGNOSIS — I4891 Unspecified atrial fibrillation: Secondary | ICD-10-CM

## 2016-06-24 LAB — POCT INR: INR: 2.8

## 2016-06-24 NOTE — Patient Instructions (Signed)
Pre visit review using our clinic review tool, if applicable. No additional management support is needed unless otherwise documented below in the visit note. 

## 2016-06-25 ENCOUNTER — Ambulatory Visit: Payer: PPO | Admitting: Family Medicine

## 2016-06-26 ENCOUNTER — Encounter: Payer: Self-pay | Admitting: Family Medicine

## 2016-06-26 ENCOUNTER — Ambulatory Visit (INDEPENDENT_AMBULATORY_CARE_PROVIDER_SITE_OTHER): Payer: PPO | Admitting: Family Medicine

## 2016-06-26 VITALS — BP 134/88 | HR 98 | Temp 97.9°F | Ht 71.0 in | Wt 205.0 lb

## 2016-06-26 DIAGNOSIS — S76211A Strain of adductor muscle, fascia and tendon of right thigh, initial encounter: Secondary | ICD-10-CM

## 2016-06-26 NOTE — Progress Notes (Signed)
Pre visit review using our clinic review tool, if applicable. No additional management support is needed unless otherwise documented below in the visit note. 

## 2016-06-26 NOTE — Progress Notes (Signed)
   Subjective:    Patient ID: Justin Gallegos, male    DOB: 01-Jan-1929, 82 y.o.   MRN: [id number]  HPI Here for 10 days of pain in the right groin. No change in urinations or BMs. 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 The pain bothers him standing or going up steps. No back pain. Using Tylenol prn. The pain was worst at first and now is easing up some.    Review of Systems  Constitutional: Negative.   Cardiovascular: Negative.   Gastrointestinal: Positive for abdominal pain. Negative for abdominal distention, anal bleeding, blood in stool, constipation, diarrhea, nausea, rectal pain and vomiting.  Genitourinary: Negative.        Objective:   Physical Exam  Constitutional: He appears well-developed and well-nourished.  Cardiovascular: Normal rate, regular rhythm, normal heart sounds and intact distal pulses.   Pulmonary/Chest: Effort normal and breath sounds normal.  Abdominal: Soft. Bowel sounds are normal. He exhibits no distension and no mass. There is no rebound and no guarding.  Tender in the right groin area, no masses   Genitourinary:  Genitourinary Comments: No hernias felt and no testicular tenderness           Assessment & Plan:  Right groin pain, probable muscular strain. Rest and Tylenol prn. He will take it easy with his workouts for several weeks.  Laurey Morale, MD

## 2016-07-17 DIAGNOSIS — C678 Malignant neoplasm of overlapping sites of bladder: Secondary | ICD-10-CM | POA: Diagnosis not present

## 2016-07-18 ENCOUNTER — Ambulatory Visit (INDEPENDENT_AMBULATORY_CARE_PROVIDER_SITE_OTHER): Payer: PPO | Admitting: Family Medicine

## 2016-07-18 ENCOUNTER — Encounter: Payer: Self-pay | Admitting: Family Medicine

## 2016-07-18 VITALS — BP 144/88 | HR 95 | Temp 97.9°F | Ht 71.0 in | Wt 205.0 lb

## 2016-07-18 DIAGNOSIS — Z0000 Encounter for general adult medical examination without abnormal findings: Secondary | ICD-10-CM | POA: Diagnosis not present

## 2016-07-18 LAB — CBC WITH DIFFERENTIAL/PLATELET
BASOS ABS: 0 10*3/uL (ref 0.0–0.1)
BASOS PCT: 0.6 % (ref 0.0–3.0)
EOS ABS: 0.1 10*3/uL (ref 0.0–0.7)
Eosinophils Relative: 0.9 % (ref 0.0–5.0)
HEMATOCRIT: 49 % (ref 39.0–52.0)
HEMOGLOBIN: 16.7 g/dL (ref 13.0–17.0)
LYMPHS PCT: 32.2 % (ref 12.0–46.0)
Lymphs Abs: 2 10*3/uL (ref 0.7–4.0)
MCHC: 34.1 g/dL (ref 30.0–36.0)
MCV: 91.6 fl (ref 78.0–100.0)
MONOS PCT: 12.8 % — AB (ref 3.0–12.0)
Monocytes Absolute: 0.8 10*3/uL (ref 0.1–1.0)
Neutro Abs: 3.4 10*3/uL (ref 1.4–7.7)
Neutrophils Relative %: 53.5 % (ref 43.0–77.0)
Platelets: 173 10*3/uL (ref 150.0–400.0)
RBC: 5.35 Mil/uL (ref 4.22–5.81)
RDW: 14.4 % (ref 11.5–15.5)
WBC: 6.4 10*3/uL (ref 4.0–10.5)

## 2016-07-18 LAB — POC URINALSYSI DIPSTICK (AUTOMATED)
BILIRUBIN UA: NEGATIVE
GLUCOSE UA: NEGATIVE
Ketones, UA: NEGATIVE
LEUKOCYTES UA: NEGATIVE
NITRITE UA: NEGATIVE
RBC UA: NEGATIVE
Spec Grav, UA: 1.02
UROBILINOGEN UA: 0.2
pH, UA: 6

## 2016-07-18 LAB — LIPID PANEL
CHOLESTEROL: 174 mg/dL (ref 0–200)
HDL: 40.8 mg/dL (ref >39.00)
LDL Cholesterol: 99 mg/dL (ref 0–99)
NonHDL: 133.57
TRIGLYCERIDES: 175 mg/dL — AB (ref 0.0–149.0)
Total CHOL/HDL Ratio: 4
VLDL: 35 mg/dL (ref 0.0–40.0)

## 2016-07-18 LAB — BASIC METABOLIC PANEL
BUN: 25 mg/dL — ABNORMAL HIGH (ref 6–23)
CHLORIDE: 106 meq/L (ref 96–112)
CO2: 26 mEq/L (ref 19–32)
Calcium: 9 mg/dL (ref 8.4–10.5)
Creatinine, Ser: 1.76 mg/dL — ABNORMAL HIGH (ref 0.40–1.50)
GFR: 39.24 mL/min — AB (ref >60.00)
Glucose, Bld: 88 mg/dL (ref 70–99)
POTASSIUM: 4.8 meq/L (ref 3.5–5.1)
SODIUM: 141 meq/L (ref 135–145)

## 2016-07-18 LAB — HEPATIC FUNCTION PANEL
ALBUMIN: 3.9 g/dL (ref 3.5–5.2)
ALK PHOS: 73 U/L (ref 39–117)
ALT: 17 U/L (ref 0–53)
AST: 22 U/L (ref 0–37)
BILIRUBIN DIRECT: 0.2 mg/dL (ref 0.0–0.3)
BILIRUBIN TOTAL: 0.8 mg/dL (ref 0.2–1.2)
Total Protein: 6.3 g/dL (ref 6.0–8.3)

## 2016-07-18 LAB — TSH: TSH: 2.02 u[IU]/mL (ref 0.35–4.50)

## 2016-07-18 LAB — PSA: PSA: 1.28 ng/mL (ref 0.10–4.00)

## 2016-07-18 NOTE — Progress Notes (Signed)
   Subjective:    Patient ID: Justin Gallegos, male    DOB: 1929-08-01, 82 y.o.   MRN: [id number]  HPI 82 yr old male for a well exam. He feels fine and has no concerns. He saw Dr. Alinda Money yesterday for a urologic exam and he sees [name] next week for a cardiologic exam.    Review of Systems  Constitutional: Negative.   HENT: Negative.   Eyes: Negative.   Respiratory: Negative.   Cardiovascular: Negative.   Gastrointestinal: Negative.   Genitourinary: Negative.   Musculoskeletal: Negative.   Skin: Negative.   Neurological: Negative.   Psychiatric/Behavioral: Negative.        Objective:   Physical Exam  Constitutional: He is oriented to person, place, and time. He appears well-developed and well-nourished. No distress.  HENT:  Head: Normocephalic and atraumatic.  Right Ear: External ear normal.  Left Ear: External ear normal.  Nose: Nose normal.  Mouth/Throat: Oropharynx is clear and moist. No oropharyngeal exudate.  Eyes: Conjunctivae and EOM are normal. Pupils are equal, round, and reactive to light. Right eye exhibits no discharge. Left eye exhibits no discharge. No scleral icterus.  Neck: Neck supple. No JVD present. No tracheal deviation present. No thyromegaly present.  Cardiovascular: Normal rate, normal heart sounds and intact distal pulses.  Exam reveals no gallop and no friction rub.   No murmur heard. Irregular rhythm   Pulmonary/Chest: Effort normal and breath sounds normal. No respiratory distress. He has no wheezes. He has no rales. He exhibits no tenderness.  Abdominal: Soft. Bowel sounds are normal. He exhibits no distension and no mass. There is no tenderness. There is no rebound and no guarding.  Musculoskeletal: Normal range of motion. He exhibits no edema or tenderness.  Lymphadenopathy:    He has no cervical adenopathy.  Neurological: He is alert and oriented to person, place, and time. He has normal reflexes. No cranial nerve deficit. He exhibits normal  muscle tone. Coordination normal.  Skin: Skin is warm and dry. No rash noted. He is not diaphoretic. No erythema. No pallor.  Psychiatric: He has a normal mood and affect. His behavior is normal. Judgment and thought content normal.          Assessment & Plan:  Well exam. Get fasting labs today. We discussed diet and exercise.  Laurey Morale, MD

## 2016-07-18 NOTE — Progress Notes (Signed)
Pre visit review using our clinic review tool, if applicable. No additional management support is needed unless otherwise documented below in the visit note. 

## 2016-07-21 NOTE — Progress Notes (Signed)
Cardiology Office Note   Date:  07/22/2016   ID:  Justin Gallegos, Justin Gallegos 06/21/1929, MRN [id number]  PCP:  Laurey Morale, MD  Cardiologist:   Dorris Carnes, MD   F/u of atrial fib      History of Present Illness: Justin Gallegos is a 82 y.o. male with a history of atrial fib, HL  I saw him in Dec 2016    CCmplains of cough for 1 week  No fevers or chlls  No sinus drainage  No SOB    Denies palptiations  No dizziness    Outpatient Medications Prior to Visit  Medication Sig Dispense Refill  . atorvastatin (LIPITOR) 40 MG tablet Take 1 tablet (40 mg total) by mouth daily. 90 tablet 3  . fenofibrate 160 MG tablet Take 1 tablet (160 mg total) by mouth daily. 90 tablet 1  . fish oil-omega-3 fatty acids 1000 MG capsule Take 1 g by mouth daily.      . fluticasone (FLONASE) 50 MCG/ACT nasal spray USE TWO SPRAYS IN EACH NOSTRIL AS NEEDED 16 g 0  . metoprolol succinate (TOPROL-XL) 25 MG 24 hr tablet TAKE ONE-HALF TABLET BY MOUTH ONCE DAILY 45 tablet 1  . warfarin (COUMADIN) 5 MG tablet Take as directed by anticoagulation clinic 90 tablet 1   No facility-administered medications prior to visit.      Allergies:   Amoxicillin-pot clavulanate   Past Medical History:  Diagnosis Date  . Arthritis   . Atrial fibrillation [facility])    sees [name]   . Cancer [facility])    ureteral, per Dr. Alinda Money   . Hemorrhoids   . Hyperlipidemia   . Hypertension   . Low back pain   . TIA (transient ischemic attack)     Past Surgical History:  Procedure Laterality Date  . APPENDECTOMY    . CATARACT EXTRACTION, BILATERAL  2013   per [name]   . COLONOSCOPY  06-28-10   per [name], benign polyps and severe diverticulosis, no repeats needed   . HERNIA REPAIR    . NEPHRECTOMY    . TONSILLECTOMY    . URETERECTOMY       Social History:  The patient  reports that he has never smoked. He has never used smokeless tobacco. He reports that he does not drink alcohol or use drugs.   Family  History:  The patient's family history includes Stroke in his father.    ROS:  Please see the history of present illness. All other systems are reviewed and  Negative to the above problem except as noted.    PHYSICAL EXAM: VS:  BP 132/84   Pulse (!) 115   Ht 5\' 11"  (1.803 m)   Wt 206 lb 12.8 oz (93.8 kg)   BMI 28.84 kg/m   GEN: Well nourished, well developed, in no acute distress  HEENT: normal  Neck: no JVD, carotid bruits, or masses Cardiac: RRR; no murmurs, rubs, or gallops,no edema  Respiratory:  Rhonchi LLL  Clears some  No wheezes  No ralers GI: soft, nontender, nondistended, + BS  No hepatomegaly  MS: no deformity Moving all extremities   Skin: warm and dry, no rash Neuro:  Strength and sensation are intact Psych: euthymic mood, full affect   EKG:  EKG is ordered today.  Atrial fib  115 bpm     Lipid Panel    Component Value Date/Time   CHOL 174 07/18/2016 0953   TRIG 175.0 (H) 07/18/2016 [id number]  TRIG 178 (H) 07/24/2006 0829   HDL 40.80 07/18/2016 0953   CHOLHDL 4 07/18/2016 0953   VLDL 35.0 07/18/2016 0953   LDLCALC 99 07/18/2016 0953   LDLDIRECT 124.0 05/10/2015 1057      Wt Readings from Last 3 Encounters:  07/22/16 206 lb 12.8 oz (93.8 kg)  07/18/16 205 lb (93 kg)  06/26/16 205 lb (93 kg)      ASSESSMENT AND PLAN:  1  Atrial fib   Chronic  Will set up for INR today hopefully  Send to [facility]   HR is irreg  Would like to set up for holter monitor  For 24 hour control   INR is 2.1  Will forward to [place] at [place] clinic    2  Cough  I dont think infectious  Discussed Robitussin    3  HL  LDL is good  Keep on same regimen   F/U in 1 year   Current medicines are reviewed at length with the patient today.  The patient does not have concerns regarding medicines.  Signed, Dorris Carnes, MD  07/22/2016 10:02 AM    [address], [place], [place]  [address] Phone: [phone]; Fax: [phone]

## 2016-07-22 ENCOUNTER — Ambulatory Visit (INDEPENDENT_AMBULATORY_CARE_PROVIDER_SITE_OTHER): Payer: PPO | Admitting: General Practice

## 2016-07-22 ENCOUNTER — Ambulatory Visit (INDEPENDENT_AMBULATORY_CARE_PROVIDER_SITE_OTHER): Payer: PPO | Admitting: Internal Medicine

## 2016-07-22 ENCOUNTER — Encounter: Payer: Self-pay | Admitting: Internal Medicine

## 2016-07-22 VITALS — BP 132/84 | HR 115 | Ht 71.0 in | Wt 206.8 lb

## 2016-07-22 DIAGNOSIS — I4891 Unspecified atrial fibrillation: Secondary | ICD-10-CM

## 2016-07-22 LAB — POCT INR: INR: 2.1

## 2016-07-22 NOTE — Patient Instructions (Signed)
Pre visit review using our clinic review tool, if applicable. No additional management support is needed unless otherwise documented below in the visit note. 

## 2016-07-22 NOTE — Patient Instructions (Signed)
Your physician recommends that you continue on your current medications as directed. Please refer to the Current Medication list given to you today.  Your physician has recommended that you wear a holter monitor. Holter monitors are medical devices that record the heart's electrical activity. Doctors most often use these monitors to diagnose arrhythmias. Arrhythmias are problems with the speed or rhythm of the heartbeat. The monitor is a small, portable device. You can wear one while you do your normal daily activities. This is usually used to diagnose what is causing palpitations/syncope (passing out).  OK TO SCHEDULE AFTER THE HOLIDAYS.  Your physician wants you to follow-up in: 1 year with [name].  You will receive a reminder letter in the mail two months in advance. If you don't receive a letter, please call our office to schedule the follow-up appointment.   YOUR INR TODAY IS 2.1.  We will contact [facility] to inform them.  Please call to make sure they are aware and adjust your dose if needed.

## 2016-07-29 ENCOUNTER — Ambulatory Visit: Payer: PPO

## 2016-07-30 ENCOUNTER — Encounter (INDEPENDENT_AMBULATORY_CARE_PROVIDER_SITE_OTHER): Payer: PPO | Admitting: Ophthalmology

## 2016-07-30 DIAGNOSIS — H34833 Tributary (branch) retinal vein occlusion, bilateral, with macular edema: Secondary | ICD-10-CM | POA: Diagnosis not present

## 2016-07-30 DIAGNOSIS — H43813 Vitreous degeneration, bilateral: Secondary | ICD-10-CM | POA: Diagnosis not present

## 2016-07-30 DIAGNOSIS — H35033 Hypertensive retinopathy, bilateral: Secondary | ICD-10-CM

## 2016-07-30 DIAGNOSIS — H353111 Nonexudative age-related macular degeneration, right eye, early dry stage: Secondary | ICD-10-CM

## 2016-07-30 DIAGNOSIS — I10 Essential (primary) hypertension: Secondary | ICD-10-CM | POA: Diagnosis not present

## 2016-08-20 ENCOUNTER — Other Ambulatory Visit: Payer: Self-pay | Admitting: Family Medicine

## 2016-08-29 ENCOUNTER — Encounter (INDEPENDENT_AMBULATORY_CARE_PROVIDER_SITE_OTHER): Payer: PPO | Admitting: Ophthalmology

## 2016-08-30 ENCOUNTER — Encounter (INDEPENDENT_AMBULATORY_CARE_PROVIDER_SITE_OTHER): Payer: PPO | Admitting: Ophthalmology

## 2016-08-30 ENCOUNTER — Encounter: Payer: Self-pay | Admitting: Internal Medicine

## 2016-08-30 DIAGNOSIS — H35033 Hypertensive retinopathy, bilateral: Secondary | ICD-10-CM

## 2016-08-30 DIAGNOSIS — H43813 Vitreous degeneration, bilateral: Secondary | ICD-10-CM | POA: Diagnosis not present

## 2016-08-30 DIAGNOSIS — I10 Essential (primary) hypertension: Secondary | ICD-10-CM

## 2016-08-30 DIAGNOSIS — H34832 Tributary (branch) retinal vein occlusion, left eye, with macular edema: Secondary | ICD-10-CM | POA: Diagnosis not present

## 2016-08-30 DIAGNOSIS — H353111 Nonexudative age-related macular degeneration, right eye, early dry stage: Secondary | ICD-10-CM | POA: Diagnosis not present

## 2016-09-02 ENCOUNTER — Ambulatory Visit (INDEPENDENT_AMBULATORY_CARE_PROVIDER_SITE_OTHER): Payer: PPO | Admitting: General Practice

## 2016-09-02 DIAGNOSIS — I4891 Unspecified atrial fibrillation: Secondary | ICD-10-CM

## 2016-09-02 DIAGNOSIS — Z5181 Encounter for therapeutic drug level monitoring: Secondary | ICD-10-CM

## 2016-09-02 LAB — POCT INR: INR: 2.4

## 2016-09-02 NOTE — Patient Instructions (Signed)
Pre visit review using our clinic review tool, if applicable. No additional management support is needed unless otherwise documented below in the visit note. 

## 2016-09-04 ENCOUNTER — Ambulatory Visit (INDEPENDENT_AMBULATORY_CARE_PROVIDER_SITE_OTHER): Payer: PPO

## 2016-09-04 DIAGNOSIS — I4891 Unspecified atrial fibrillation: Secondary | ICD-10-CM

## 2016-09-06 ENCOUNTER — Telehealth: Payer: Self-pay | Admitting: Internal Medicine

## 2016-09-06 DIAGNOSIS — I10 Essential (primary) hypertension: Secondary | ICD-10-CM

## 2016-09-06 DIAGNOSIS — I4891 Unspecified atrial fibrillation: Secondary | ICD-10-CM

## 2016-09-06 DIAGNOSIS — R94111 Abnormal electroretinogram [ERG]: Secondary | ICD-10-CM

## 2016-09-06 NOTE — Telephone Encounter (Signed)
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

## 2016-09-09 ENCOUNTER — Telehealth: Payer: Self-pay | Admitting: General Practice

## 2016-09-09 NOTE — Addendum Note (Signed)
Addended by: Rodman Key on: 09/09/2016 04:01 PM   Modules accepted: Orders

## 2016-09-09 NOTE — Telephone Encounter (Signed)
Placed orders for echo and carotid ultrasound. Called patient to inform.  He is aware someone will be calling him to schedule both studies. He states his coumadin clinic has already called him to adjust medication based on Dr. Alan Ripper recommendation.  He is appreciative for the call.  He states per his eye dr he has a blood clot behind his left eye.  Will have document from opthalmology scanned into his chart.

## 2016-09-09 NOTE — Telephone Encounter (Signed)
Spoke with patient and instructed him to increase coumadin dosage to 2.5 mg daily except 5 mg on Mon/Wed/Friday/Saturday.  Goal range has been changed from 2.0 - 3.0 to 2.5 to 3.0 per [name].  Please next scheduled appointment.  Patient verbalized understanding.

## 2016-09-12 ENCOUNTER — Other Ambulatory Visit: Payer: Self-pay | Admitting: *Deleted

## 2016-09-12 MED ORDER — METOPROLOL SUCCINATE ER 25 MG PO TB24: 25.0000 mg | ORAL_TABLET | Freq: Every day | ORAL | 3 refills | Status: DC

## 2016-09-12 NOTE — Progress Notes (Signed)
See holter monitor results.

## 2016-09-25 ENCOUNTER — Encounter (INDEPENDENT_AMBULATORY_CARE_PROVIDER_SITE_OTHER): Payer: PPO | Admitting: Ophthalmology

## 2016-09-25 DIAGNOSIS — I10 Essential (primary) hypertension: Secondary | ICD-10-CM | POA: Diagnosis not present

## 2016-09-25 DIAGNOSIS — H43813 Vitreous degeneration, bilateral: Secondary | ICD-10-CM | POA: Diagnosis not present

## 2016-09-25 DIAGNOSIS — H35033 Hypertensive retinopathy, bilateral: Secondary | ICD-10-CM

## 2016-09-25 DIAGNOSIS — H34833 Tributary (branch) retinal vein occlusion, bilateral, with macular edema: Secondary | ICD-10-CM | POA: Diagnosis not present

## 2016-09-25 DIAGNOSIS — H34232 Retinal artery branch occlusion, left eye: Secondary | ICD-10-CM | POA: Diagnosis not present

## 2016-09-30 ENCOUNTER — Ambulatory Visit (HOSPITAL_COMMUNITY)
Admission: RE | Admit: 2016-09-30 | Discharge: 2016-09-30 | Disposition: A | Discharge status: Home / Self Care | Payer: PPO | Source: Ambulatory Visit | Attending: Internal Medicine | Admitting: Internal Medicine

## 2016-09-30 DIAGNOSIS — I10 Essential (primary) hypertension: Secondary | ICD-10-CM | POA: Diagnosis not present

## 2016-09-30 DIAGNOSIS — R94111 Abnormal electroretinogram [ERG]: Secondary | ICD-10-CM

## 2016-09-30 DIAGNOSIS — I4891 Unspecified atrial fibrillation: Secondary | ICD-10-CM

## 2016-09-30 DIAGNOSIS — I6523 Occlusion and stenosis of bilateral carotid arteries: Secondary | ICD-10-CM | POA: Insufficient documentation

## 2016-10-09 ENCOUNTER — Ambulatory Visit (HOSPITAL_COMMUNITY): Payer: PPO | Attending: Cardiovascular Disease

## 2016-10-09 ENCOUNTER — Other Ambulatory Visit: Payer: Self-pay

## 2016-10-09 DIAGNOSIS — I10 Essential (primary) hypertension: Secondary | ICD-10-CM

## 2016-10-09 DIAGNOSIS — I517 Cardiomegaly: Secondary | ICD-10-CM | POA: Insufficient documentation

## 2016-10-09 DIAGNOSIS — I340 Nonrheumatic mitral (valve) insufficiency: Secondary | ICD-10-CM | POA: Diagnosis not present

## 2016-10-09 DIAGNOSIS — R9439 Abnormal result of other cardiovascular function study: Secondary | ICD-10-CM | POA: Insufficient documentation

## 2016-10-09 DIAGNOSIS — I4891 Unspecified atrial fibrillation: Secondary | ICD-10-CM

## 2016-10-09 DIAGNOSIS — R94111 Abnormal electroretinogram [ERG]: Secondary | ICD-10-CM

## 2016-10-14 ENCOUNTER — Ambulatory Visit (INDEPENDENT_AMBULATORY_CARE_PROVIDER_SITE_OTHER): Payer: PPO | Admitting: General Practice

## 2016-10-14 ENCOUNTER — Ambulatory Visit (INDEPENDENT_AMBULATORY_CARE_PROVIDER_SITE_OTHER): Payer: PPO | Admitting: Family Medicine

## 2016-10-14 ENCOUNTER — Other Ambulatory Visit: Payer: Self-pay | Admitting: General Practice

## 2016-10-14 ENCOUNTER — Encounter: Payer: Self-pay | Admitting: Family Medicine

## 2016-10-14 VITALS — BP 146/102 | HR 94 | Temp 98.2°F | Ht 71.0 in | Wt 208.0 lb

## 2016-10-14 DIAGNOSIS — Z7901 Long term (current) use of anticoagulants: Secondary | ICD-10-CM

## 2016-10-14 DIAGNOSIS — I481 Persistent atrial fibrillation: Secondary | ICD-10-CM

## 2016-10-14 DIAGNOSIS — I4819 Other persistent atrial fibrillation: Secondary | ICD-10-CM

## 2016-10-14 DIAGNOSIS — Z5181 Encounter for therapeutic drug level monitoring: Secondary | ICD-10-CM | POA: Diagnosis not present

## 2016-10-14 DIAGNOSIS — I10 Essential (primary) hypertension: Secondary | ICD-10-CM

## 2016-10-14 DIAGNOSIS — H348322 Tributary (branch) retinal vein occlusion, left eye, stable: Secondary | ICD-10-CM

## 2016-10-14 DIAGNOSIS — I4891 Unspecified atrial fibrillation: Secondary | ICD-10-CM

## 2016-10-14 LAB — POCT INR: INR: 3.4

## 2016-10-14 MED ORDER — WARFARIN SODIUM 1 MG PO TABS: ORAL_TABLET | ORAL | 0 refills | Status: DC

## 2016-10-14 NOTE — Progress Notes (Signed)
   Subjective:    Patient ID: Justin Gallegos, male    DOB: 24-Mar-1929, 83 y.o.   MRN: [id number]  HPI Here asking for advice with his wife. He developed blurring vision several months ago and his optometrist referred him to Dr. Tempie Hoist. 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 So far he has had 3 of these injections. He was also found to have some inflammation in the right eye a few weeks ago, and he gave him a steroid injection to the right eye. No headache or other neurologic symptoms. His vision is poor now so his wife is driving him around. He has been checking his Coumadin levels regularly and these are being closely monitored. He recently had an ECHO per [name] and this shows good systolic function and very little change from the last one. He also had carotid dopplers which showed 1-39% plaque on each side, again no change from last time. The dose of his Metoprolol was increased to 50 mg from 25mg . At home his pulse averages 85-90 and his BP averages 140s to 150s over 80s.    Review of Systems  Constitutional: Negative.   HENT: Negative.   Eyes: Positive for visual disturbance. Negative for photophobia, pain, discharge, redness and itching.  Respiratory: Negative.   Cardiovascular: Negative.   Neurological: Negative.        Objective:   Physical Exam  Constitutional: He is oriented to person, place, and time. He appears well-developed and well-nourished.  Walks with a cane   Eyes: Conjunctivae and EOM are normal. Pupils are equal, round, and reactive to light.  Neck: Neck supple. No thyromegaly present.  Cardiovascular: Normal rate, normal heart sounds and intact distal pulses.   Irregular rhythm   Pulmonary/Chest: Effort normal and breath sounds normal.  Lymphadenopathy:    He has no cervical adenopathy.  Neurological: He is alert and oriented to person, place, and time. No cranial nerve deficit.           Assessment & Plan:  His atrial fibrillation and HTN seem to be stable. He now has a branch vein occlusion and he is getting Avastin injections per Dr. Zigmund Daniel. He will continue the current regimen. I asked them to request that Dr. Zigmund Daniel send me office whenever they see him.  Alysia Penna, MD

## 2016-10-14 NOTE — Progress Notes (Signed)
Pre visit review using our clinic review tool, if applicable. No additional management support is needed unless otherwise documented below in the visit note. 

## 2016-10-14 NOTE — Patient Instructions (Signed)
Pre visit review using our clinic review tool, if applicable. No additional management support is needed unless otherwise documented below in the visit note. 

## 2016-10-14 NOTE — Patient Instructions (Signed)
WE NOW OFFER   [facility] FAST TRACK!!!  SAME DAY Appointments for ACUTE CARE  Such as: Sprains, Injuries, cuts, abrasions, rashes, muscle pain, joint pain, back pain Colds, flu, sore throats, headache, allergies, cough, fever  Ear pain, sinus and eye infections Abdominal pain, nausea, vomiting, diarrhea, upset stomach Animal/insect bites  3 Easy Ways to Schedule: Walk-In Scheduling Call in scheduling Mychart Sign-up: [email or web address]         

## 2016-10-30 ENCOUNTER — Encounter (INDEPENDENT_AMBULATORY_CARE_PROVIDER_SITE_OTHER): Payer: PPO | Admitting: Ophthalmology

## 2016-10-30 DIAGNOSIS — H35033 Hypertensive retinopathy, bilateral: Secondary | ICD-10-CM | POA: Diagnosis not present

## 2016-10-30 DIAGNOSIS — H34833 Tributary (branch) retinal vein occlusion, bilateral, with macular edema: Secondary | ICD-10-CM | POA: Diagnosis not present

## 2016-10-30 DIAGNOSIS — I10 Essential (primary) hypertension: Secondary | ICD-10-CM | POA: Diagnosis not present

## 2016-10-30 DIAGNOSIS — H34233 Retinal artery branch occlusion, bilateral: Secondary | ICD-10-CM

## 2016-10-30 DIAGNOSIS — H43813 Vitreous degeneration, bilateral: Secondary | ICD-10-CM | POA: Diagnosis not present

## 2016-11-06 DIAGNOSIS — Z961 Presence of intraocular lens: Secondary | ICD-10-CM | POA: Diagnosis not present

## 2016-11-11 ENCOUNTER — Ambulatory Visit (INDEPENDENT_AMBULATORY_CARE_PROVIDER_SITE_OTHER): Payer: PPO | Admitting: General Practice

## 2016-11-11 DIAGNOSIS — H40053 Ocular hypertension, bilateral: Secondary | ICD-10-CM | POA: Diagnosis not present

## 2016-11-11 DIAGNOSIS — Z5181 Encounter for therapeutic drug level monitoring: Secondary | ICD-10-CM | POA: Diagnosis not present

## 2016-11-11 DIAGNOSIS — I4891 Unspecified atrial fibrillation: Secondary | ICD-10-CM

## 2016-11-11 DIAGNOSIS — H348332 Tributary (branch) retinal vein occlusion, bilateral, stable: Secondary | ICD-10-CM | POA: Diagnosis not present

## 2016-11-11 DIAGNOSIS — H34233 Retinal artery branch occlusion, bilateral: Secondary | ICD-10-CM | POA: Diagnosis not present

## 2016-11-11 DIAGNOSIS — Z961 Presence of intraocular lens: Secondary | ICD-10-CM | POA: Diagnosis not present

## 2016-11-11 LAB — POCT INR: INR: 3

## 2016-11-11 NOTE — Patient Instructions (Signed)
Pre visit review using our clinic review tool, if applicable. No additional management support is needed unless otherwise documented below in the visit note. 

## 2016-11-25 ENCOUNTER — Other Ambulatory Visit: Payer: Self-pay | Admitting: Family Medicine

## 2016-11-27 DIAGNOSIS — L820 Inflamed seborrheic keratosis: Secondary | ICD-10-CM | POA: Diagnosis not present

## 2016-11-27 DIAGNOSIS — D1801 Hemangioma of skin and subcutaneous tissue: Secondary | ICD-10-CM | POA: Diagnosis not present

## 2016-11-27 DIAGNOSIS — L570 Actinic keratosis: Secondary | ICD-10-CM | POA: Diagnosis not present

## 2016-11-27 DIAGNOSIS — Z85828 Personal history of other malignant neoplasm of skin: Secondary | ICD-10-CM | POA: Diagnosis not present

## 2016-11-27 DIAGNOSIS — L814 Other melanin hyperpigmentation: Secondary | ICD-10-CM | POA: Diagnosis not present

## 2016-11-27 DIAGNOSIS — L821 Other seborrheic keratosis: Secondary | ICD-10-CM | POA: Diagnosis not present

## 2016-11-28 ENCOUNTER — Encounter (INDEPENDENT_AMBULATORY_CARE_PROVIDER_SITE_OTHER): Payer: PPO | Admitting: Ophthalmology

## 2016-11-28 DIAGNOSIS — I10 Essential (primary) hypertension: Secondary | ICD-10-CM | POA: Diagnosis not present

## 2016-11-28 DIAGNOSIS — H43813 Vitreous degeneration, bilateral: Secondary | ICD-10-CM

## 2016-11-28 DIAGNOSIS — H35033 Hypertensive retinopathy, bilateral: Secondary | ICD-10-CM | POA: Diagnosis not present

## 2016-11-28 DIAGNOSIS — H34833 Tributary (branch) retinal vein occlusion, bilateral, with macular edema: Secondary | ICD-10-CM | POA: Diagnosis not present

## 2016-11-28 DIAGNOSIS — H34233 Retinal artery branch occlusion, bilateral: Secondary | ICD-10-CM

## 2016-12-02 DIAGNOSIS — H40053 Ocular hypertension, bilateral: Secondary | ICD-10-CM | POA: Diagnosis not present

## 2016-12-02 DIAGNOSIS — H348332 Tributary (branch) retinal vein occlusion, bilateral, stable: Secondary | ICD-10-CM | POA: Diagnosis not present

## 2016-12-02 DIAGNOSIS — H34233 Retinal artery branch occlusion, bilateral: Secondary | ICD-10-CM | POA: Diagnosis not present

## 2016-12-02 DIAGNOSIS — Z961 Presence of intraocular lens: Secondary | ICD-10-CM | POA: Diagnosis not present

## 2016-12-09 ENCOUNTER — Ambulatory Visit (INDEPENDENT_AMBULATORY_CARE_PROVIDER_SITE_OTHER): Payer: PPO | Admitting: General Practice

## 2016-12-09 DIAGNOSIS — Z5181 Encounter for therapeutic drug level monitoring: Secondary | ICD-10-CM

## 2016-12-09 DIAGNOSIS — I4891 Unspecified atrial fibrillation: Secondary | ICD-10-CM

## 2016-12-09 LAB — POCT INR: INR: 3.5

## 2016-12-09 NOTE — Patient Instructions (Signed)
Pre visit review using our clinic review tool, if applicable. No additional management support is needed unless otherwise documented below in the visit note. 

## 2016-12-26 ENCOUNTER — Ambulatory Visit (INDEPENDENT_AMBULATORY_CARE_PROVIDER_SITE_OTHER): Payer: PPO | Admitting: Family Medicine

## 2016-12-26 ENCOUNTER — Encounter: Payer: Self-pay | Admitting: Family Medicine

## 2016-12-26 VITALS — BP 130/80 | HR 84 | Temp 97.5°F | Wt 204.6 lb

## 2016-12-26 DIAGNOSIS — I10 Essential (primary) hypertension: Secondary | ICD-10-CM | POA: Diagnosis not present

## 2016-12-26 DIAGNOSIS — W19XXXA Unspecified fall, initial encounter: Secondary | ICD-10-CM

## 2016-12-26 DIAGNOSIS — R42 Dizziness and giddiness: Secondary | ICD-10-CM

## 2016-12-26 DIAGNOSIS — I4891 Unspecified atrial fibrillation: Secondary | ICD-10-CM

## 2016-12-26 NOTE — Progress Notes (Signed)
Subjective:    Patient ID: Justin Gallegos, male    DOB: [birth year], 83 y.o.   MRN: [id number]  HPI  Mr. Justin Gallegos is an 83 year old male who presents today due to a fall yesterday that occurred in his bathroom after standing up from the toilet. He reports tipping forward and catching himself with his right arm on the bathroom floor. Denies hitting his head and reports landing on his right shoulder and elbow. He states he felt like he was a little lightheaded however denies syncope or presyncope (feeling faint) event. He denies any other episodes of feeling lightheaded or dizzy. Prior to this episode he reports the day before he had mowed his yard in the heat for approximately 2 hours however he reports drinking water and states that his urine has been pale yellow.  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 He denies chest pain, palpitations, numbness, tingling, weakness, jaw/arm pain, N/V/D.  He denies any bruising. He feels well otherwise.    He has had an EKG and carotid US due to recent eye treatment and no changes were noted according to patient. Bilateral carotid US completed 09/2016 indicated stable plaquing of carotid arteries. Echo completed on 10/09/16 which indicated normal function of heart with MV of mild to mod insufficiency.  Cardiology in 07/22/2016, he was evaluated with a holter monitor due to HR was irregular.  This indicated atrial fibrillation and Toprol XL was increased to 50 mg/day which improved his rate. He denies adverse effects with Toprol which has controlled his rate.  Review of Systems  Constitutional: Negative for chills, fatigue and fever.  Eyes: Negative for visual disturbance.  Respiratory: Negative for cough, shortness of breath and wheezing.   Cardiovascular: Negative for chest pain and palpitations.  Gastrointestinal: Negative for abdominal pain, constipation, diarrhea, nausea and vomiting.  Genitourinary:  Negative for frequency.  Musculoskeletal: Negative for myalgias.  Skin: Negative for rash.  Neurological: Positive for light-headedness. Negative for dizziness, numbness and headaches.  Hematological:       On warfarin; denies bruising  Psychiatric/Behavioral:       Denies depressed or anxious mood today   Past Medical History:  Diagnosis Date  . Arthritis   . Atrial fibrillation [facility])    sees [name]   . Cancer [facility])    ureteral, per Dr. Alinda Money   . Hemorrhoids   . Hyperlipidemia   . Hypertension   . Low back pain   . TIA (transient ischemic attack)      Social History   Social History  . Marital status: Married    Spouse name: N/A  . Number of children: N/A  . Years of education: N/A   Occupational History  . Not on file.   Social History Main Topics  . Smoking status: Never Smoker  . Smokeless tobacco: Never Used  . Alcohol use No  . Drug use: No  . Sexual activity: Not on file   Other Topics Concern  . Not on file   Social History Narrative  . No narrative on file    Past Surgical History:  Procedure Laterality Date  . APPENDECTOMY    . CATARACT EXTRACTION, BILATERAL  2013   per [name]   . COLONOSCOPY  06-28-10   per [name], benign polyps and severe diverticulosis, no repeats needed   . HERNIA REPAIR    . NEPHRECTOMY    . TONSILLECTOMY    .  URETERECTOMY      Family History  Problem Relation Age of Onset  . Stroke Father   . Coronary artery disease Unknown        fhx  . Stroke Unknown        fhx    Allergies  Allergen Reactions  . Amoxicillin-Pot Clavulanate     REACTION: throat swelling    Current Outpatient Prescriptions on File Prior to Visit  Medication Sig Dispense Refill  . atorvastatin (LIPITOR) 40 MG tablet Take 1 tablet (40 mg total) by mouth daily. 90 tablet 3  . fenofibrate 160 MG tablet TAKE ONE TABLET BY MOUTH ONCE DAILY 90 tablet 1  . fish oil-omega-3 fatty acids 1000 MG capsule Take 1 g by mouth daily.       . fluticasone (FLONASE) 50 MCG/ACT nasal spray USE TWO SPRAYS IN EACH NOSTRIL AS NEEDED (Patient taking differently: USE TWO SPRAYS IN EACH NOSTRIL AS NEEDED over the counter) 16 g 0  . metoprolol succinate (TOPROL-XL) 25 MG 24 hr tablet Take 1 tablet (25 mg total) by mouth daily. 90 tablet 3  . warfarin (COUMADIN) 1 MG tablet Take as directed by anticoagulation clinic.  Use in addition to 5 mg tablets. 30 tablet 0  . warfarin (COUMADIN) 5 MG tablet TAKE AS DIRECTED BY  ANTICOAGULATION  CLINIC 90 tablet 1   No current facility-administered medications on file prior to visit.     BP 130/80 (BP Location: Left Arm, Patient Position: Sitting, Cuff Size: Normal)   Pulse 84   Temp 97.5 F (36.4 C) (Oral)   Wt 204 lb 9.6 oz (92.8 kg)   SpO2 97%   BMI 28.54 kg/m   Orthostatic BP sitting 132/82, standing 128/80    Objective:   Physical Exam  Constitutional: He is oriented to person, place, and time. He appears well-developed and well-nourished.  Walks with a cane for stability  Eyes: Pupils are equal, round, and reactive to light. No scleral icterus.  Neck: Neck supple.  Cardiovascular: Normal rate, regular rhythm and intact distal pulses.   Pulmonary/Chest: Effort normal and breath sounds normal. He has no wheezes. He has no rales.  Abdominal: Soft. Bowel sounds are normal. There is no tenderness.  Musculoskeletal: He exhibits no edema.  Right Shoulder: Inspection reveals no abnormalities, atrophy or asymmetry. Palpation is normal with no tenderness over AC joint or bicipital groove. ROM is full in all planes. Rotator cuff strength normal throughout. No signs of impingement  Normal scapular function observed. No painful arc and no drop arm sign.   Lymphadenopathy:    He has no cervical adenopathy.  Neurological: He is alert and oriented to person, place, and time. Coordination normal.  Skin: Skin is warm and dry. No rash noted.  Psychiatric: He has a normal mood and affect. His  behavior is normal. Judgment and thought content normal.      Assessment & Plan:  1. Fall, initial encounter Exam is reassuring; no LOC or cardiopulmonary symptoms associated; appears to be either mechanical in nature or possible orthostatic hypotension with prior episode of mowing yard in the heat for 2 hours and he may have not hydrated well.   2. Lightheaded Improved; no symptoms of lightheadedness have occurred. Concern for orthostatic hypotension has mentioned above. We reviewed the importance of hydration, avoiding heat, and also tips for getting up slowing.  3. Atrial fibrillation, unspecified type ([place]) Followed by cardiology; rate controlled; regular rhythm today  4. Essential hypertension Controlled; BP sitting and standing  was not concerning for orthostatic hypotension today; suspect that prior episode may be related to not hydrating adequately.  We discussed the importance of close follow up and reviewed concerning symptoms of dizziness, lightheadedness, chest pain, palpitations, SOB, N/V, jaw pain, or arm pain that require immediate medical attention. He reported feeling well today and no other episodes of lightheadedness; he was not interested in further work up at this time.  Advised follow up with PCP if further symptoms develop or cardiology as recommended.   Delano Metz, FNP-C

## 2016-12-26 NOTE — Patient Instructions (Addendum)
It was a pleasure to see you today. Please monitor BP and heart rate. Continue with follow up as scheduled with [name] and also for your Coumadin checks. Please make sure that you are drinking plenty of water and avoid long periods of heat exposure.   Hypotension As your heart beats, it forces blood through your body. This force is called blood pressure. If you have hypotension, you have low blood pressure. When your blood pressure is too low, you may not get enough blood to your brain. You may feel weak, feel light-headed, have a fast heartbeat, or even pass out (faint). Follow these instructions at home: Eating and drinking   Drink enough fluids to keep your pee (urine) clear or pale yellow.  Eat a healthy diet, and follow instructions from your doctor about eating or drinking restrictions. A healthy diet includes:  Fresh fruits and vegetables.  Whole grains.  Low-fat (lean) meats.  Low-fat dairy products.  Eat extra salt only as told. Do not add extra salt to your diet unless your doctor tells you to.  Eat small meals often.  Avoid standing up quickly after you eat. Medicines   Take over-the-counter and prescription medicines only as told by your doctor.  Follow instructions from your doctor about changing how much you take (the dosage) of your medicines, if this applies.  Do not stop or change your medicine on your own. General instructions   Wear compression stockings as told by your doctor.  Get up slowly from lying down or sitting.  Avoid hot showers and a lot of heat as told by your doctor.  Return to your normal activities as told by your doctor. Ask what activities are safe for you.  Do not use any products that contain nicotine or tobacco, such as cigarettes and e-cigarettes. If you need help quitting, ask your doctor.  Keep all follow-up visits as told by your doctor. This is important. Contact a doctor if:  You throw up (vomit).  You have watery poop  (diarrhea).  You have a fever for more than 2-3 days.  You feel more thirsty than normal.  You feel weak and tired. Get help right away if:  You have chest pain.  You have a fast or irregular heartbeat.  You lose feeling (get numbness) in any part of your body.  You cannot move your arms or your legs.  You have trouble talking.  You get sweaty or feel light-headed.  You faint.  You have trouble breathing.  You have trouble staying awake.  You feel confused. This information is not intended to replace advice given to you by your health care provider. Make sure you discuss any questions you have with your health care provider. Document Released: 10/23/2009 Document Revised: 04/16/2016 Document Reviewed: 04/16/2016 Elsevier Interactive Patient Education  2017 [facility] FAST TRACK!!!  SAME DAY Appointments for ACUTE CARE  Such as: Sprains, Injuries, cuts, abrasions, rashes, muscle pain, joint pain, back pain Colds, flu, sore throats, headache, allergies, cough, fever  Ear pain, sinus and eye infections Abdominal pain, nausea, vomiting, diarrhea, upset stomach Animal/insect bites  3 Easy Ways to Schedule: Walk-In Scheduling Call in scheduling Mychart Sign-up: [email or web address]

## 2016-12-30 ENCOUNTER — Ambulatory Visit (INDEPENDENT_AMBULATORY_CARE_PROVIDER_SITE_OTHER): Payer: PPO | Admitting: Family Medicine

## 2016-12-30 ENCOUNTER — Encounter: Payer: Self-pay | Admitting: Family Medicine

## 2016-12-30 VITALS — BP 122/80 | Temp 98.3°F | Ht 71.0 in | Wt 205.8 lb

## 2016-12-30 DIAGNOSIS — R29898 Other symptoms and signs involving the musculoskeletal system: Secondary | ICD-10-CM

## 2016-12-30 NOTE — Progress Notes (Signed)
   Subjective:    Patient ID: Justin Gallegos, male    DOB: 05/16/1929, 83 y.o.   MRN: [id number]  HPI Here to discuss the sudden onset of weakness in the left leg one week ago. No other neurologic deficits. He has fallen twice in the last week because the left leg gave way beneath him. No back pain. He has been walking with a cane.    Review of Systems  Respiratory: Negative.   Cardiovascular: Negative.   Neurological: Positive for weakness. Negative for dizziness, seizures, syncope, facial asymmetry, speech difficulty, light-headedness, numbness and headaches.       Objective:   Physical Exam  Constitutional: He is oriented to person, place, and time. He appears well-developed and well-nourished.  Neck: No thyromegaly present.  Cardiovascular: Normal rate, regular rhythm, normal heart sounds and intact distal pulses.   Pulmonary/Chest: Effort normal and breath sounds normal.  Lymphadenopathy:    He has no cervical adenopathy.  Neurological: He is alert and oriented to person, place, and time. No cranial nerve deficit. Coordination normal.  Motor strength is 4 out of 4 in the right leg and 3 out of 4 in the left leg           Assessment & Plan:  Left leg weakness, it is concerning that he has had a stroke. He is already on Coumadin which is monitored closely. We will set up a brain MRI soon.  Alysia Penna, MD

## 2017-01-01 ENCOUNTER — Telehealth: Payer: Self-pay | Admitting: Family Medicine

## 2017-01-01 NOTE — Telephone Encounter (Signed)
Pts wife was calling to check the status to the MRI and would like to have a call

## 2017-01-01 NOTE — Telephone Encounter (Signed)
This was sent to [facility] on [address]. They can contact them to ask

## 2017-01-02 NOTE — Telephone Encounter (Signed)
I spoke with pt and gave the phone number for imaging, he is going to call and try to get on the schedule.

## 2017-01-02 NOTE — Telephone Encounter (Signed)
Pt called [facility] and the earliest they can schedule for MRI is June 3 and at 6 am in the morning. Is is okay to wait until then?

## 2017-01-10 ENCOUNTER — Encounter (INDEPENDENT_AMBULATORY_CARE_PROVIDER_SITE_OTHER): Payer: PPO | Admitting: Ophthalmology

## 2017-01-10 DIAGNOSIS — H35033 Hypertensive retinopathy, bilateral: Secondary | ICD-10-CM

## 2017-01-10 DIAGNOSIS — H34233 Retinal artery branch occlusion, bilateral: Secondary | ICD-10-CM

## 2017-01-10 DIAGNOSIS — I10 Essential (primary) hypertension: Secondary | ICD-10-CM

## 2017-01-10 DIAGNOSIS — H43813 Vitreous degeneration, bilateral: Secondary | ICD-10-CM | POA: Diagnosis not present

## 2017-01-10 DIAGNOSIS — H34833 Tributary (branch) retinal vein occlusion, bilateral, with macular edema: Secondary | ICD-10-CM

## 2017-01-15 ENCOUNTER — Ambulatory Visit (INDEPENDENT_AMBULATORY_CARE_PROVIDER_SITE_OTHER): Payer: PPO | Admitting: General Practice

## 2017-01-15 DIAGNOSIS — I4891 Unspecified atrial fibrillation: Secondary | ICD-10-CM

## 2017-01-15 DIAGNOSIS — Z5181 Encounter for therapeutic drug level monitoring: Secondary | ICD-10-CM

## 2017-01-15 LAB — POCT INR: INR: 2.9

## 2017-01-15 NOTE — Patient Instructions (Signed)
Pre visit review using our clinic review tool, if applicable. No additional management support is needed unless otherwise documented below in the visit note. 

## 2017-01-16 ENCOUNTER — Other Ambulatory Visit: Payer: PPO

## 2017-01-18 ENCOUNTER — Ambulatory Visit
Admission: RE | Admit: 2017-01-18 | Discharge: 2017-01-18 | Disposition: A | Discharge status: Home / Self Care | Payer: PPO | Source: Ambulatory Visit | Attending: Family Medicine | Admitting: Family Medicine

## 2017-01-18 DIAGNOSIS — R29898 Other symptoms and signs involving the musculoskeletal system: Secondary | ICD-10-CM | POA: Diagnosis not present

## 2017-01-18 DIAGNOSIS — S0990XA Unspecified injury of head, initial encounter: Secondary | ICD-10-CM | POA: Diagnosis not present

## 2017-01-21 NOTE — Addendum Note (Signed)
Addended by: Alysia Penna A on: 01/21/2017 12:54 PM   Modules accepted: Orders

## 2017-01-22 ENCOUNTER — Encounter: Payer: Self-pay | Admitting: Neurology

## 2017-02-11 ENCOUNTER — Ambulatory Visit (INDEPENDENT_AMBULATORY_CARE_PROVIDER_SITE_OTHER): Payer: PPO | Admitting: Neurology

## 2017-02-11 ENCOUNTER — Encounter: Payer: Self-pay | Admitting: Neurology

## 2017-02-11 VITALS — BP 118/66 | HR 88 | Ht 71.0 in | Wt 207.0 lb

## 2017-02-11 DIAGNOSIS — R29898 Other symptoms and signs involving the musculoskeletal system: Secondary | ICD-10-CM

## 2017-02-11 NOTE — Patient Instructions (Signed)
I can't say for sure that you had a TIA.  You didn't seem to have any true weakness.  It may have been increased weakness from overwork and overheating.

## 2017-02-11 NOTE — Progress Notes (Signed)
NEUROLOGY CONSULTATION NOTE  Justin Gallegos MRN: [id number] DOB: 1929/05/24  Referring provider: [name] Primary care provider: [name]  Reason for consult:  Left leg weakness  HISTORY OF PRESENT ILLNESS: Justin Gallegos is an 83 year old right-handed male with atrial fibrillation (on [facility]), hypertension, hyperlipidemia who presents for left leg weakness.  He is accompanied by his wife and daughter who supplement history.  There were two or three consecutive days in May in which his left suddenly buckled, causing him to fall.  One time, it occurred after walking out of the bathroom and then next day it occurred while stepping out of the shower.  While in the kitchen, his left knee buckled but he was able to catch himself from falling by holding onto the counter.  Other than those brief moments, he denied any left leg weakness.  He denied left arm weakness and numbness of the left arm and leg.  He did not have a facial droop or slurred speech.  He denied dizziness.  He denies low back pain or radicular pain down the leg.  During those couple of days, he was working outside in the heat.  He has some baseline bilateral lower extremity weakness.  Otherwise, he has been doing well.  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  Echocardiogram from 10/09/16 demonstrated LV EF 60-65% with mild to moderate mitral valve insufficiency Carotid doppler from 09/30/16 showed no hemodynamically significant ICA stenosis. 24 hour Holter monitor from January 2018 revealed atrial fibrillation with average heart rate 96 bpm. 07/18/16 Lipid panel showed LDL of 99.  He reports prior history of TIA about 5 or 6 years ago.  He was in the hospital for bronchitis and had an allergic reaction to the antibiotic.  During that time, he had confusion  and was diagnosed with probable TIA.  His wife reports that he did not have any lateralizing symptoms.  PAST MEDICAL HISTORY: Past Medical History:  Diagnosis Date  . Arthritis   . Atrial fibrillation [facility])    sees [name]   . Cancer [facility])    ureteral, per Dr. Alinda Money   . Hemorrhoids   . Hyperlipidemia   . Hypertension   . Low back pain   . TIA (transient ischemic attack)     PAST SURGICAL HISTORY: Past Surgical History:  Procedure Laterality Date  . APPENDECTOMY    . CATARACT EXTRACTION, BILATERAL  2013   per [name]   . COLONOSCOPY  06-28-10   per [name], benign polyps and severe diverticulosis, no repeats needed   . HERNIA REPAIR    . NEPHRECTOMY    . TONSILLECTOMY    . URETERECTOMY      MEDICATIONS: Current Outpatient Prescriptions on File Prior to Visit  Medication Sig Dispense Refill  . atorvastatin (LIPITOR) 40 MG tablet Take 1 tablet (40 mg total) by mouth daily. 90 tablet 3  . fenofibrate 160 MG tablet TAKE ONE TABLET BY MOUTH ONCE DAILY 90 tablet 1  . fluticasone (FLONASE) 50 MCG/ACT nasal spray USE TWO SPRAYS IN EACH NOSTRIL AS NEEDED (Patient taking differently: USE TWO SPRAYS IN EACH NOSTRIL AS NEEDED over the counter) 16 g 0  . metoprolol succinate (TOPROL-XL) 25 MG 24 hr tablet Take 1 tablet (25 mg total) by mouth daily. 90 tablet 3  . warfarin (  COUMADIN) 1 MG tablet Take as directed by anticoagulation clinic.  Use in addition to 5 mg tablets. 30 tablet 0  . warfarin (COUMADIN) 5 MG tablet TAKE AS DIRECTED BY  ANTICOAGULATION  CLINIC 90 tablet 1   No current facility-administered medications on file prior to visit.     ALLERGIES: Allergies  Allergen Reactions  . Amoxicillin-Pot Clavulanate     REACTION: throat swelling    FAMILY HISTORY: Family History  Problem Relation Age of Onset  . Stroke Father   . Coronary artery disease Unknown        fhx  . Stroke Unknown        fhx    SOCIAL HISTORY: Social History   Social History    . Marital status: Married    Spouse name: N/A  . Number of children: N/A  . Years of education: N/A   Occupational History  . Not on file.   Social History Main Topics  . Smoking status: Never Smoker  . Smokeless tobacco: Never Used  . Alcohol use No  . Drug use: No  . Sexual activity: Not on file   Other Topics Concern  . Not on file   Social History Narrative  . No narrative on file    REVIEW OF SYSTEMS: Constitutional: No fevers, chills, or sweats, no generalized fatigue, change in appetite Eyes: No visual changes, double vision, eye pain Ear, nose and throat: No hearing loss, ear pain, nasal congestion, sore throat Cardiovascular: No chest pain, palpitations Respiratory:  No shortness of breath at rest or with exertion, wheezes GastrointestinaI: No nausea, vomiting, diarrhea, abdominal pain, fecal incontinence Genitourinary:  No dysuria, urinary retention or frequency Musculoskeletal:  No neck pain, back pain Integumentary: No rash, pruritus, skin lesions Neurological: as above Psychiatric: No depression, insomnia, anxiety Endocrine: No palpitations, fatigue, diaphoresis, mood swings, change in appetite, change in weight, increased thirst Hematologic/Lymphatic:  No purpura, petechiae. Allergic/Immunologic: no itchy/runny eyes, nasal congestion, recent allergic reactions, rashes  PHYSICAL EXAM: Vitals:   02/11/17 1355  BP: 118/66  Pulse: 88   General: No acute distress.  Patient appears well-groomed.  Head:  Normocephalic/atraumatic Eyes:  fundi examined but not visualized Neck: supple, no paraspinal tenderness, full range of motion Back: No paraspinal tenderness Heart: regular rate and rhythm Lungs: Clear to auscultation bilaterally. Vascular: No carotid bruits. Neurological Exam: Mental status: alert and oriented to person, place, and time, recent and remote memory intact, fund of knowledge intact, attention and concentration intact, speech fluent and not  dysarthric, language intact. Cranial nerves: CN I: not tested CN II: pupils equal, round and reactive to light, visual fields intact CN III, IV, VI:  full range of motion, no nystagmus, no ptosis CN V: facial sensation intact CN VII: upper and lower face symmetric CN VIII: hearing intact CN IX, X: gag intact, uvula midline CN XI: sternocleidomastoid and trapezius muscles intact CN XII: tongue midline Bulk & Tone: normal, no fasciculations. Motor:  5/5 throughout  Sensation:  Decreased pinprick and vibration sensation in the toes. Deep Tendon Reflexes:  absent throughout, toes downgoing. Finger to nose testing:  Without dysmetria.  Gait:  Mildly wide-based gait.  Able to turn but not tandem walk. Romberg negative.  IMPRESSION: Transient episodes of left leg giving out.  Vague and not convincing for TIA.  While his left knee buckled 3 times, he otherwise denied any leg weakness or numbness.  He has baseline leg weakness and he may have been feeling weak while working outside in  the heat for those couple of days.  For that manner, if the history regarding his prior TIA is correct, he may not have had a TIA at that time either (nonfocal symptoms with confusion may have been secondary to infection or medication reaction).  PLAN: He is treated for secondary stroke prevention anyway. 1.  Anticoagulation 2.  Blood pressure management 3.  Statin therapy  Follow up as needed.  Thank you for allowing me to take part in the care of this patient.  Metta Clines, DO  CC:  Alysia Penna, MD

## 2017-02-18 DIAGNOSIS — H43813 Vitreous degeneration, bilateral: Secondary | ICD-10-CM | POA: Diagnosis not present

## 2017-02-18 DIAGNOSIS — H3582 Retinal ischemia: Secondary | ICD-10-CM | POA: Diagnosis not present

## 2017-02-18 DIAGNOSIS — H34833 Tributary (branch) retinal vein occlusion, bilateral, with macular edema: Secondary | ICD-10-CM | POA: Diagnosis not present

## 2017-02-19 ENCOUNTER — Ambulatory Visit (INDEPENDENT_AMBULATORY_CARE_PROVIDER_SITE_OTHER): Payer: PPO | Admitting: General Practice

## 2017-02-19 DIAGNOSIS — Z5181 Encounter for therapeutic drug level monitoring: Secondary | ICD-10-CM

## 2017-02-19 DIAGNOSIS — I4891 Unspecified atrial fibrillation: Secondary | ICD-10-CM

## 2017-02-19 LAB — POCT INR: INR: 3

## 2017-02-19 NOTE — Patient Instructions (Signed)
Pre visit review using our clinic review tool, if applicable. No additional management support is needed unless otherwise documented below in the visit note. 

## 2017-02-20 ENCOUNTER — Encounter (INDEPENDENT_AMBULATORY_CARE_PROVIDER_SITE_OTHER): Payer: PPO | Admitting: Ophthalmology

## 2017-02-24 ENCOUNTER — Other Ambulatory Visit: Payer: Self-pay | Admitting: Family Medicine

## 2017-03-04 DIAGNOSIS — H34833 Tributary (branch) retinal vein occlusion, bilateral, with macular edema: Secondary | ICD-10-CM | POA: Diagnosis not present

## 2017-03-19 ENCOUNTER — Ambulatory Visit (INDEPENDENT_AMBULATORY_CARE_PROVIDER_SITE_OTHER): Payer: PPO | Admitting: General Practice

## 2017-03-19 DIAGNOSIS — Z5181 Encounter for therapeutic drug level monitoring: Secondary | ICD-10-CM

## 2017-03-19 DIAGNOSIS — I4891 Unspecified atrial fibrillation: Secondary | ICD-10-CM

## 2017-03-19 LAB — POCT INR: INR: 3.6

## 2017-03-19 NOTE — Patient Instructions (Signed)
Pre visit review using our clinic review tool, if applicable. No additional management support is needed unless otherwise documented below in the visit note. 

## 2017-03-20 ENCOUNTER — Other Ambulatory Visit: Payer: Self-pay | Admitting: Family Medicine

## 2017-03-20 ENCOUNTER — Other Ambulatory Visit: Payer: Self-pay | Admitting: General Practice

## 2017-03-20 MED ORDER — WARFARIN SODIUM 1 MG PO TABS: ORAL_TABLET | ORAL | 0 refills | Status: DC

## 2017-03-20 NOTE — Telephone Encounter (Signed)
Pt requests for Refill on Warfarin. Please Advise

## 2017-04-01 DIAGNOSIS — H3582 Retinal ischemia: Secondary | ICD-10-CM | POA: Diagnosis not present

## 2017-04-01 DIAGNOSIS — H34833 Tributary (branch) retinal vein occlusion, bilateral, with macular edema: Secondary | ICD-10-CM | POA: Diagnosis not present

## 2017-04-01 DIAGNOSIS — H43813 Vitreous degeneration, bilateral: Secondary | ICD-10-CM | POA: Diagnosis not present

## 2017-04-14 ENCOUNTER — Other Ambulatory Visit: Payer: Self-pay | Admitting: Family Medicine

## 2017-04-16 ENCOUNTER — Ambulatory Visit (INDEPENDENT_AMBULATORY_CARE_PROVIDER_SITE_OTHER): Payer: PPO | Admitting: General Practice

## 2017-04-16 DIAGNOSIS — Z5181 Encounter for therapeutic drug level monitoring: Secondary | ICD-10-CM | POA: Diagnosis not present

## 2017-04-16 DIAGNOSIS — I4891 Unspecified atrial fibrillation: Secondary | ICD-10-CM

## 2017-04-16 LAB — POCT INR: INR: 2.6

## 2017-04-16 NOTE — Patient Instructions (Signed)
Pre visit review using our clinic review tool, if applicable. No additional management support is needed unless otherwise documented below in the visit note. 

## 2017-04-29 DIAGNOSIS — H3582 Retinal ischemia: Secondary | ICD-10-CM | POA: Diagnosis not present

## 2017-04-29 DIAGNOSIS — H34833 Tributary (branch) retinal vein occlusion, bilateral, with macular edema: Secondary | ICD-10-CM | POA: Diagnosis not present

## 2017-04-29 DIAGNOSIS — H43813 Vitreous degeneration, bilateral: Secondary | ICD-10-CM | POA: Diagnosis not present

## 2017-05-01 ENCOUNTER — Ambulatory Visit: Payer: PPO | Admitting: Neurology

## 2017-05-21 ENCOUNTER — Ambulatory Visit (INDEPENDENT_AMBULATORY_CARE_PROVIDER_SITE_OTHER): Payer: PPO | Admitting: General Practice

## 2017-05-21 DIAGNOSIS — Z5181 Encounter for therapeutic drug level monitoring: Secondary | ICD-10-CM | POA: Diagnosis not present

## 2017-05-21 DIAGNOSIS — I4891 Unspecified atrial fibrillation: Secondary | ICD-10-CM

## 2017-05-21 LAB — POCT INR: INR: 3.2

## 2017-05-21 NOTE — Patient Instructions (Signed)
Pre visit review using our clinic review tool, if applicable. No additional management support is needed unless otherwise documented below in the visit note. 

## 2017-06-12 ENCOUNTER — Telehealth: Payer: Self-pay | Admitting: Family Medicine

## 2017-06-12 NOTE — Telephone Encounter (Signed)
Pt has been scheduled with Justin Gallegos on Friday 11/02

## 2017-06-12 NOTE — Telephone Encounter (Signed)
Pts dentist office was calling stating that they think that the pt has shingles in his mouth he was in to have sutures removed and was stating how painful his mouth is.  That would like to know if the pt need to be seen and they are aware that [name] does not have anything today she stated that she will let the dentist know and the pt know to call the office tomorrow.

## 2017-06-13 ENCOUNTER — Ambulatory Visit (INDEPENDENT_AMBULATORY_CARE_PROVIDER_SITE_OTHER): Payer: PPO | Admitting: Adult Health

## 2017-06-13 ENCOUNTER — Encounter: Payer: Self-pay | Admitting: Adult Health

## 2017-06-13 VITALS — BP 120/80 | HR 99 | Temp 97.9°F | Wt 205.6 lb

## 2017-06-13 DIAGNOSIS — B029 Zoster without complications: Secondary | ICD-10-CM | POA: Diagnosis not present

## 2017-06-13 MED ORDER — TRAMADOL HCL 50 MG PO TABS: 50.0000 mg | ORAL_TABLET | Freq: Two times a day (BID) | ORAL | 0 refills | Status: AC | PRN

## 2017-06-13 MED ORDER — VALACYCLOVIR HCL 1 G PO TABS: 1000.0000 mg | ORAL_TABLET | Freq: Three times a day (TID) | ORAL | 0 refills | Status: AC

## 2017-06-13 NOTE — Progress Notes (Signed)
Subjective:    Patient ID: Justin Gallegos, male    DOB: 31-Aug-1928, 83 y.o.   MRN: [id number]  HPI  83 year old male who is a patient of [name] who I am seeing today for an acute complaint. He was at his dentist earlier today with mouth pain and was diagnosed with shingles. His dentist did not want to treat him do to his other co morbidities. He reports pain in his mouth x 3 days. Painful when he breaths and eats    Review of Systems See HPI   Past Medical History:  Diagnosis Date  . Arthritis   . Atrial fibrillation [facility])    sees [name]   . Cancer [facility])    ureteral, per Dr. Alinda Money   . Hemorrhoids   . Hyperlipidemia   . Hypertension   . Low back pain   . TIA (transient ischemic attack)     Social History   Social History  . Marital status: Married    Spouse name: N/A  . Number of children: N/A  . Years of education: N/A   Occupational History  . Not on file.   Social History Main Topics  . Smoking status: Never Smoker  . Smokeless tobacco: Never Used  . Alcohol use No  . Drug use: No  . Sexual activity: Not on file   Other Topics Concern  . Not on file   Social History Narrative  . No narrative on file    Past Surgical History:  Procedure Laterality Date  . APPENDECTOMY    . CATARACT EXTRACTION, BILATERAL  2013   per [name]   . COLONOSCOPY  06-28-10   per [name], benign polyps and severe diverticulosis, no repeats needed   . HERNIA REPAIR    . NEPHRECTOMY    . TONSILLECTOMY    . URETERECTOMY      Family History  Problem Relation Age of Onset  . Stroke Father   . Coronary artery disease Unknown        fhx  . Stroke Unknown        fhx    Allergies  Allergen Reactions  . Amoxicillin-Pot Clavulanate     REACTION: throat swelling    Current Outpatient Prescriptions on File Prior to Visit  Medication Sig Dispense Refill  . atorvastatin (LIPITOR) 40 MG tablet TAKE ONE TABLET BY MOUTH ONCE DAILY 90 tablet 0  . fenofibrate  160 MG tablet TAKE ONE TABLET BY MOUTH ONCE DAILY 90 tablet 1  . fluticasone (FLONASE) 50 MCG/ACT nasal spray USE TWO SPRAYS IN EACH NOSTRIL AS NEEDED (Patient taking differently: USE TWO SPRAYS IN EACH NOSTRIL AS NEEDED over the counter) 16 g 0  . metoprolol succinate (TOPROL-XL) 25 MG 24 hr tablet Take 1 tablet (25 mg total) by mouth daily. 90 tablet 3  . warfarin (COUMADIN) 1 MG tablet Take as directed by anticoagulation clinic.  Use in addition to 5 mg tablets. 30 tablet 0  . warfarin (COUMADIN) 5 MG tablet TAKE AS DIRECTED BY  ANTICOAGULATION  CLINIC 90 tablet 1   No current facility-administered medications on file prior to visit.     BP 120/80 (BP Location: Left Arm, Patient Position: Sitting, Cuff Size: Large)   Pulse 99   Temp 97.9 F (36.6 C) (Oral)   Wt 205 lb 9.6 oz (93.3 kg)   SpO2 98%   BMI 28.68 kg/m       Objective:   Physical Exam  Constitutional: He is oriented to person, place, and time. He appears well-developed and well-nourished. No distress.  HENT:  Mouth/Throat: Uvula is midline, oropharynx is clear and moist and mucous membranes are normal.    Cardiovascular: Normal rate, regular rhythm, normal heart sounds and intact distal pulses.  Exam reveals no gallop and no friction rub.   No murmur heard. Pulmonary/Chest: Effort normal and breath sounds normal. No respiratory distress. He has no wheezes. He has no rales.  Neurological: He is alert and oriented to person, place, and time.  Skin: Skin is warm and dry. No rash noted. He is not diaphoretic. No erythema. No pallor.  Psychiatric: He has a normal mood and affect. His behavior is normal. Judgment and thought content normal.  Nursing note and vitals reviewed.     Assessment & Plan:  1. Herpes zoster without complication - Will treat with Valtrex. Can use tylenol for generalized pain control. Tramadol if needed for break through pain. Advised to not drink very hot or cold items for the next week  -  valACYclovir (VALTREX) 1000 MG tablet; Take 1 tablet (1,000 mg total) by mouth 3 (three) times daily.  Dispense: 30 tablet; Refill: 0 - traMADol (ULTRAM) 50 MG tablet; Take 1 tablet (50 mg total) by mouth every 12 (twelve) hours as needed.  Dispense: 20 tablet; Refill: 0  Dorothyann Peng, NP

## 2017-06-18 ENCOUNTER — Ambulatory Visit: Payer: PPO | Admitting: General Practice

## 2017-06-18 DIAGNOSIS — Z5181 Encounter for therapeutic drug level monitoring: Secondary | ICD-10-CM

## 2017-06-18 LAB — POCT INR: INR: 2.1

## 2017-06-18 NOTE — Patient Instructions (Signed)
Pre visit review using our clinic review tool, if applicable. No additional management support is needed unless otherwise documented below in the visit note. 

## 2017-06-24 DIAGNOSIS — H34833 Tributary (branch) retinal vein occlusion, bilateral, with macular edema: Secondary | ICD-10-CM | POA: Diagnosis not present

## 2017-06-24 DIAGNOSIS — H43813 Vitreous degeneration, bilateral: Secondary | ICD-10-CM | POA: Diagnosis not present

## 2017-06-24 DIAGNOSIS — H3582 Retinal ischemia: Secondary | ICD-10-CM | POA: Diagnosis not present

## 2017-06-30 IMAGING — MR MR HEAD W/O CM
10 series · 46 of 48 positions shown · non-contrast
Comparison: MRI brain 04/16/2013 and 07/28/2010

CLINICAL DATA: New onset of left leg weakness. Personal history of
stroke. Fell twice within the last 3 weeks.

EXAM:
MRI HEAD WITHOUT CONTRAST
TECHNIQUE: Multiplanar, multiecho pulse sequences of the brain and surrounding
structures were obtained without intravenous contrast.

[Series 3: T1 · sagittal · 5.0mm · 0.45mm/px · 2 of 21 slices shown]
[im 1/21]
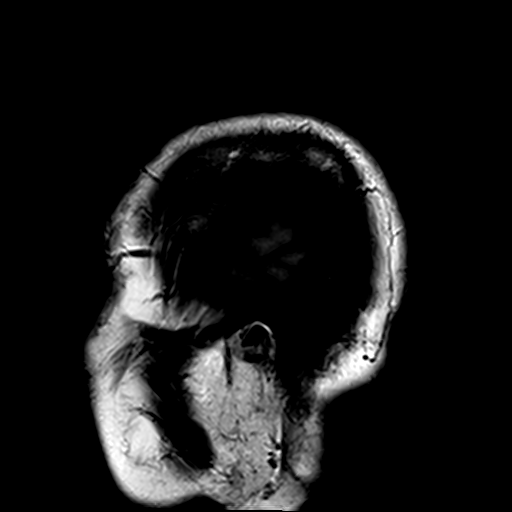
[im 21/21]
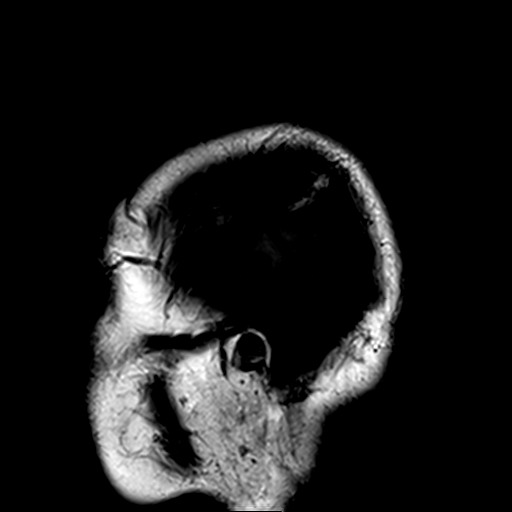

[Series 4: DWI · axial · 3.0mm · 1.80mm/px · z∈[-47,+100]mm · 8 of 100 slices shown (1 of 4)]
[im 1/100]
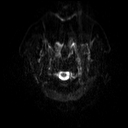
[im 15/100]
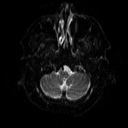
[im 29/100]
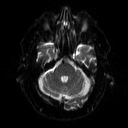
[im 43/100]
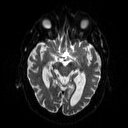
[im 57/100]
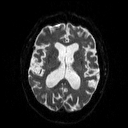
[im 71/100]
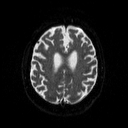
[im 85/100]
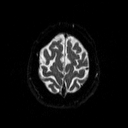
[im 100/100]
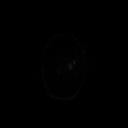

[Series 5: DWI · axial · 3.0mm · 1.80mm/px · z∈[-47,+100]mm · 4 of 50 slices shown (2 of 4)]
[im 1/50]
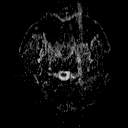
[im 17/50]
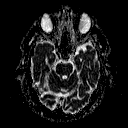
[im 33/50]
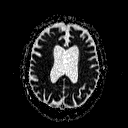
[im 50/50]
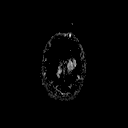

[Series 6: DWI · coronal · 5.0mm · 1.80mm/px · 5 of 64 slices shown (3 of 4)]
[im 1/64]
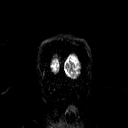
[im 16/64]
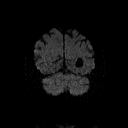
[im 32/64]
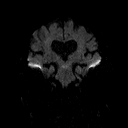
[im 48/64]
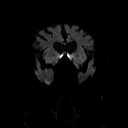
[im 64/64]
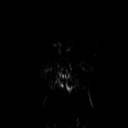

[Series 7: DWI · coronal · 5.0mm · 1.80mm/px · 3 of 33 slices shown (4 of 4)]
[im 1/33]
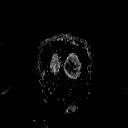
[im 17/33]
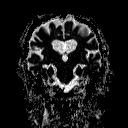
[im 33/33]
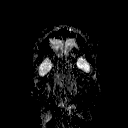

[Series 9: swi_images · axial · 2.0mm · 0.90mm/px · z∈[-52,+106]mm · 7 of 80 slices shown]
[im 1/80]
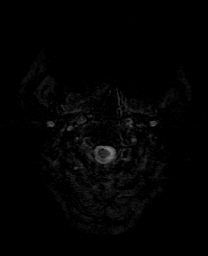
[im 14/80]
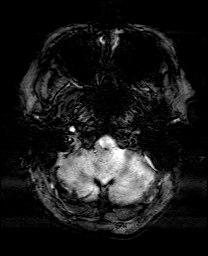
[im 27/80]
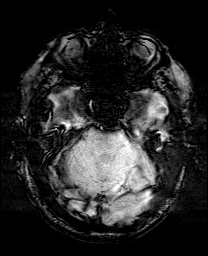
[im 40/80]
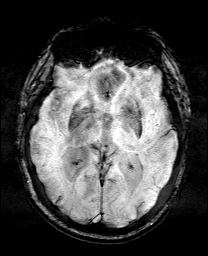
[im 53/80]
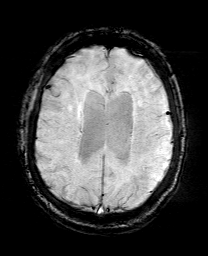
[im 66/80]
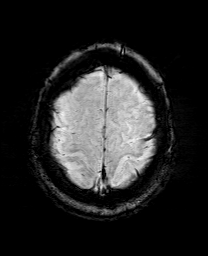
[im 80/80]
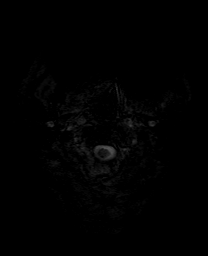

[Series 10: FLAIR · axial · 3.0mm · 0.45mm/px · z∈[-51,+105]mm · 2 of 27 slices shown]
[im 1/27]
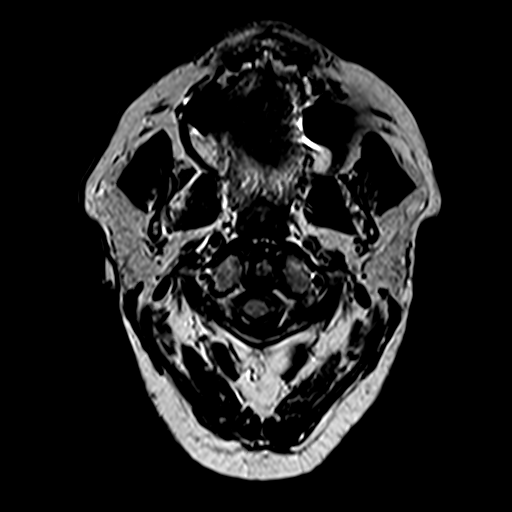
[im 27/27]
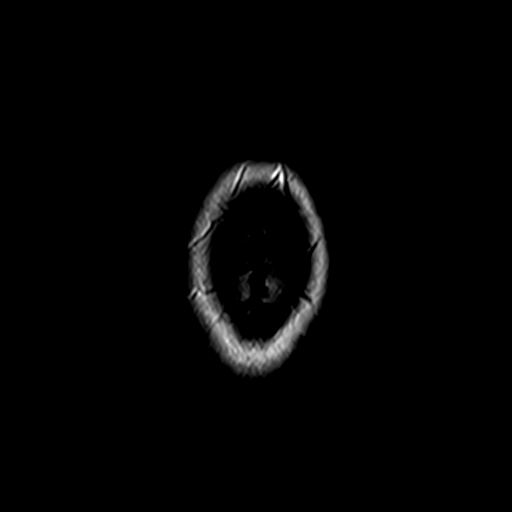

[Series 11: T2 · axial · 5.0mm · 0.51mm/px · z∈[-43,+98]mm · 2 of 22 slices shown (1 of 2)]
[im 1/22]
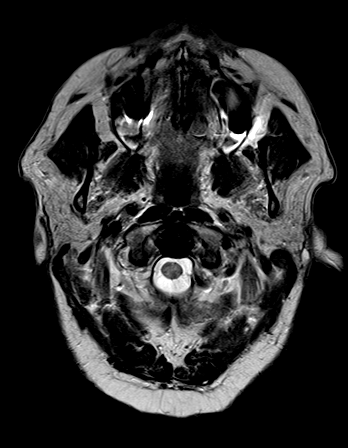
[im 22/22]
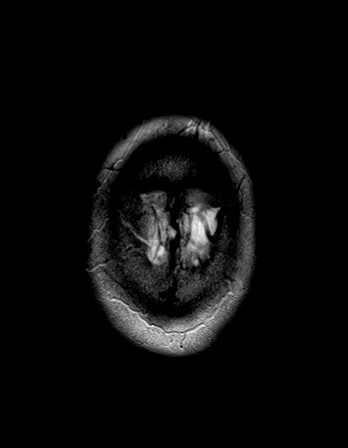

[Series 12: t1_mpr_tra · axial · 1.0mm · 0.45mm/px · z∈[-52,+107]mm · 11 of 160 slices shown]
[im 1/160]
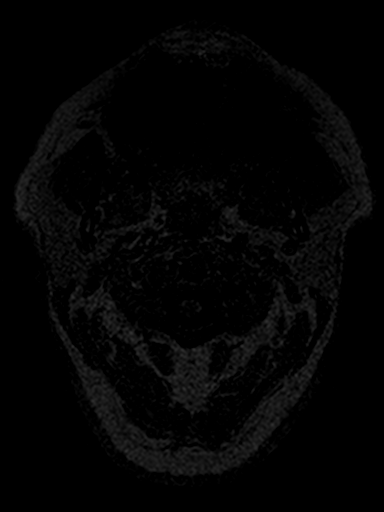
[im 14/160]
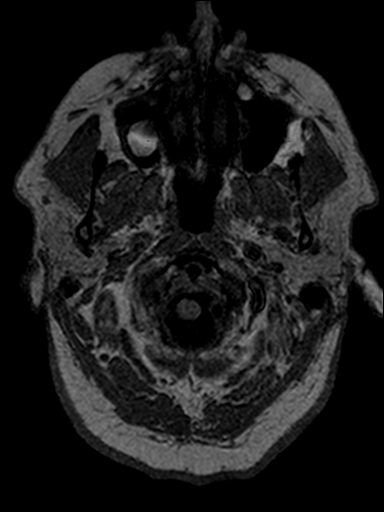
[im 27/160]
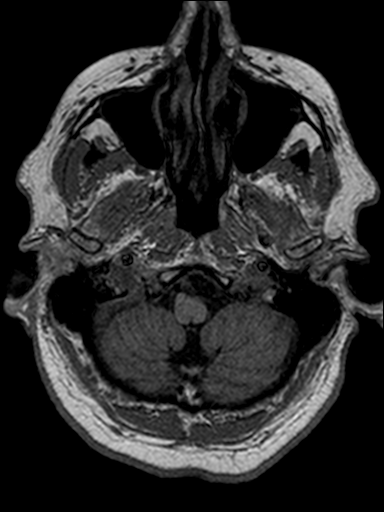
[im 40/160]
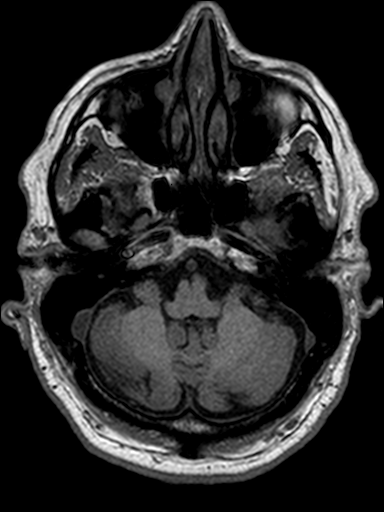
[im 54/160]
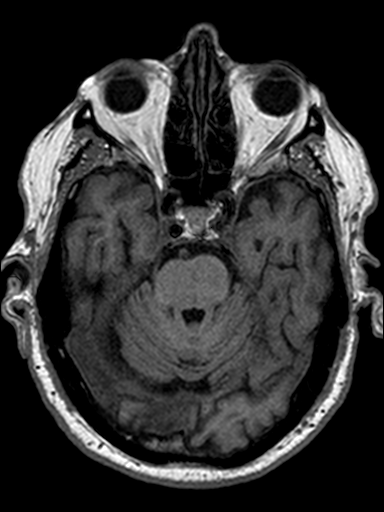
[im 67/160]
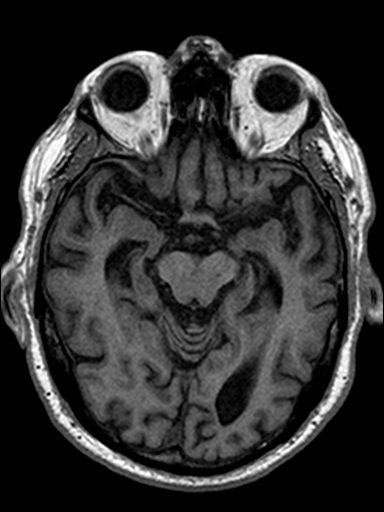
[im 80/160]
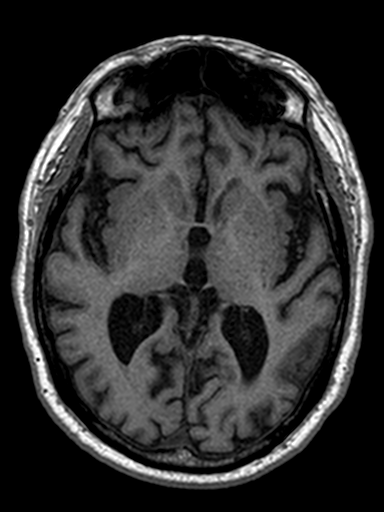
[im 93/160]
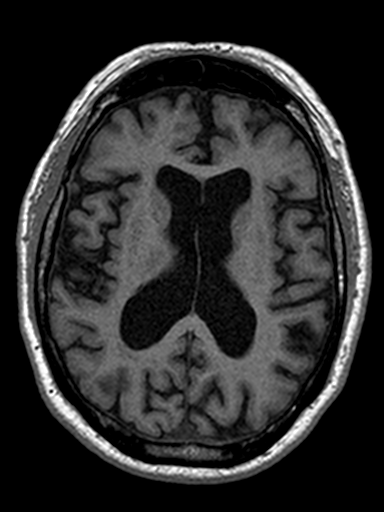
[im 107/160]
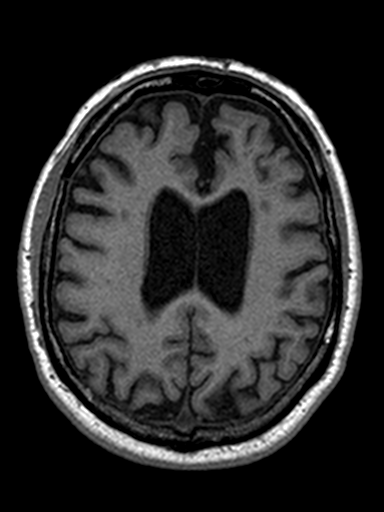
[im 133/160]
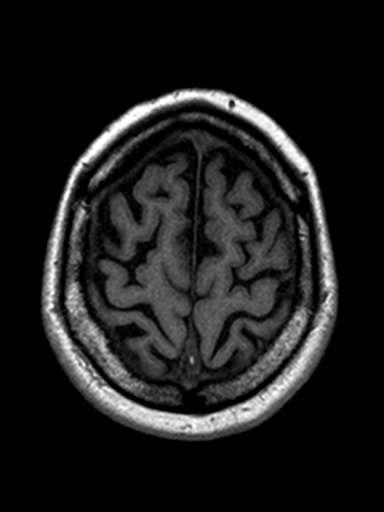
[im 160/160]
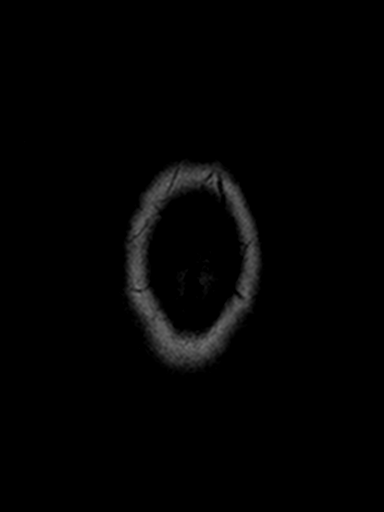

[Series 13: T2 · coronal · 5.0mm · 0.45mm/px · 2 of 26 slices shown (2 of 2)]
[im 1/26]
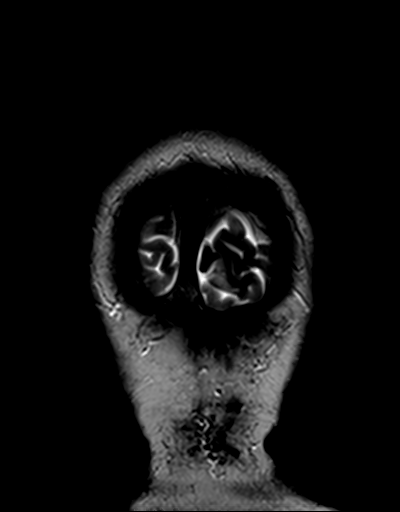
[im 26/26]
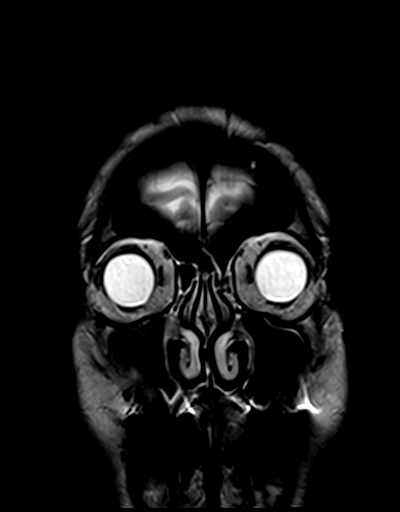

[46 of 48 positions shown; findings below may reference images not displayed]

FINDINGS: Brain: Diffusion-weighted images demonstrate no evidence for acute
or subacute infarction. Mild generalized atrophy and white matter
changes are similar to the prior study. The ventricles are
proportionate to the degree of atrophy. No acute focal hemorrhage is
present. There is no mass lesion. No significant extraaxial fluid
collection is present.

The brainstem is within normal limits. A lacunar infarct involving
the right cerebellum is new since prior study, but not acute.

Vascular: Flow is present in the major intracranial arteries. There
is some signal change the left suggesting slow flow, likely
atherosclerotic disease, in the left cavernous internal carotid
artery. Right internal carotid artery is patent.

Skull and upper cervical spine: The skullbase is within normal
limits. The craniocervical junction is normal. Midline sagittal
structures are unremarkable.

Sinuses/Orbits: Mild mucosal thickening is present in the inferior
maxillary sinuses and anterior ethmoid air cells bilaterally. The
patient is status post bilateral lens replacements. The globes and
orbits are otherwise within normal limits.
IMPRESSION: 1. No acute intracranial abnormality.
2. Stable atrophy and white matter disease. This likely reflects the
sequela of chronic microvascular ischemia.
3. Lacunar infarct of the inferior right cerebellum is new since the
prior study, but not acute.
4. Flow signal changes in the left internal carotid artery likely
reflects atherosclerotic disease competing flow without complete
occlusion.

## 2017-07-14 ENCOUNTER — Other Ambulatory Visit: Payer: Self-pay | Admitting: Family Medicine

## 2017-07-16 ENCOUNTER — Ambulatory Visit: Payer: PPO | Admitting: General Practice

## 2017-07-16 DIAGNOSIS — Z5181 Encounter for therapeutic drug level monitoring: Secondary | ICD-10-CM | POA: Diagnosis not present

## 2017-07-16 DIAGNOSIS — I4891 Unspecified atrial fibrillation: Secondary | ICD-10-CM

## 2017-07-16 DIAGNOSIS — Z7901 Long term (current) use of anticoagulants: Secondary | ICD-10-CM

## 2017-07-16 LAB — POCT INR: INR: 3.6

## 2017-07-16 NOTE — Patient Instructions (Signed)
Pre visit review using our clinic review tool, if applicable. No additional management support is needed unless otherwise documented below in the visit note.  Skip dosage today (12/5) and then continue to take 5 mg on Sun/Tues/Thurs.  and take 2.5 mg on Monday and Fridays and Saturdays and take 1 mg on Wednesdays.  Re-check in 4 weeks.

## 2017-07-21 ENCOUNTER — Encounter: Payer: PPO | Admitting: Family Medicine

## 2017-08-13 ENCOUNTER — Ambulatory Visit (INDEPENDENT_AMBULATORY_CARE_PROVIDER_SITE_OTHER): Payer: PPO | Admitting: General Practice

## 2017-08-13 DIAGNOSIS — I4891 Unspecified atrial fibrillation: Secondary | ICD-10-CM

## 2017-08-13 DIAGNOSIS — Z7901 Long term (current) use of anticoagulants: Secondary | ICD-10-CM | POA: Diagnosis not present

## 2017-08-13 LAB — POCT INR: INR: 3

## 2017-08-13 NOTE — Patient Instructions (Signed)
Pre visit review using our clinic review tool, if applicable. No additional management support is needed unless otherwise documented below in the visit note.  Continue to take 5 mg on Sun/Tues/Thurs.  and take 2.5 mg on Monday and Fridays and Saturdays and take 1 mg on Wednesdays.  Re-check in 4 weeks.

## 2017-08-15 ENCOUNTER — Ambulatory Visit: Payer: PPO | Admitting: Internal Medicine

## 2017-08-15 DIAGNOSIS — R8271 Bacteriuria: Secondary | ICD-10-CM | POA: Diagnosis not present

## 2017-08-15 DIAGNOSIS — C678 Malignant neoplasm of overlapping sites of bladder: Secondary | ICD-10-CM | POA: Diagnosis not present

## 2017-08-18 ENCOUNTER — Other Ambulatory Visit: Payer: Self-pay | Admitting: Family Medicine

## 2017-08-19 ENCOUNTER — Other Ambulatory Visit: Payer: Self-pay | Admitting: General Practice

## 2017-08-19 MED ORDER — WARFARIN SODIUM 5 MG PO TABS: ORAL_TABLET | ORAL | 1 refills | Status: DC

## 2017-08-21 ENCOUNTER — Encounter: Payer: Self-pay | Admitting: Family Medicine

## 2017-08-21 ENCOUNTER — Ambulatory Visit (INDEPENDENT_AMBULATORY_CARE_PROVIDER_SITE_OTHER): Payer: PPO | Admitting: Family Medicine

## 2017-08-21 VITALS — BP 118/78 | HR 99 | Temp 97.5°F | Ht 70.0 in | Wt 209.8 lb

## 2017-08-21 DIAGNOSIS — Z0001 Encounter for general adult medical examination with abnormal findings: Secondary | ICD-10-CM

## 2017-08-21 DIAGNOSIS — Z125 Encounter for screening for malignant neoplasm of prostate: Secondary | ICD-10-CM

## 2017-08-21 DIAGNOSIS — Z0000 Encounter for general adult medical examination without abnormal findings: Secondary | ICD-10-CM

## 2017-08-21 DIAGNOSIS — L600 Ingrowing nail: Secondary | ICD-10-CM

## 2017-08-21 LAB — CBC WITH DIFFERENTIAL/PLATELET
Basophils Absolute: 0 10*3/uL (ref 0.0–0.1)
Basophils Relative: 0.7 % (ref 0.0–3.0)
EOS PCT: 1.3 % (ref 0.0–5.0)
Eosinophils Absolute: 0.1 10*3/uL (ref 0.0–0.7)
HCT: 48.5 % (ref 39.0–52.0)
Hemoglobin: 16.1 g/dL (ref 13.0–17.0)
LYMPHS ABS: 1.9 10*3/uL (ref 0.7–4.0)
Lymphocytes Relative: 31.8 % (ref 12.0–46.0)
MCHC: 33.3 g/dL (ref 30.0–36.0)
MCV: 95.8 fl (ref 78.0–100.0)
MONO ABS: 0.8 10*3/uL (ref 0.1–1.0)
MONOS PCT: 13.2 % — AB (ref 3.0–12.0)
NEUTROS ABS: 3.1 10*3/uL (ref 1.4–7.7)
NEUTROS PCT: 53 % (ref 43.0–77.0)
Platelets: 165 10*3/uL (ref 150.0–400.0)
RBC: 5.06 Mil/uL (ref 4.22–5.81)
RDW: 14.9 % (ref 11.5–15.5)
WBC: 5.9 10*3/uL (ref 4.0–10.5)

## 2017-08-21 LAB — HEPATIC FUNCTION PANEL
ALBUMIN: 3.7 g/dL (ref 3.5–5.2)
ALT: 15 U/L (ref 0–53)
AST: 22 U/L (ref 0–37)
Alkaline Phosphatase: 56 U/L (ref 39–117)
BILIRUBIN TOTAL: 0.9 mg/dL (ref 0.2–1.2)
Bilirubin, Direct: 0.2 mg/dL (ref 0.0–0.3)
TOTAL PROTEIN: 6.1 g/dL (ref 6.0–8.3)

## 2017-08-21 LAB — BASIC METABOLIC PANEL
BUN: 24 mg/dL — AB (ref 6–23)
CHLORIDE: 106 meq/L (ref 96–112)
CO2: 26 meq/L (ref 19–32)
Calcium: 8.8 mg/dL (ref 8.4–10.5)
Creatinine, Ser: 1.77 mg/dL — ABNORMAL HIGH (ref 0.40–1.50)
GFR: 38.89 mL/min — ABNORMAL LOW (ref >60.00)
GLUCOSE: 105 mg/dL — AB (ref 70–99)
POTASSIUM: 4.4 meq/L (ref 3.5–5.1)
Sodium: 138 mEq/L (ref 135–145)

## 2017-08-21 LAB — LIPID PANEL
Cholesterol: 156 mg/dL (ref 0–200)
HDL: 33 mg/dL — ABNORMAL LOW (ref >39.00)
NonHDL: 123.45
TRIGLYCERIDES: 213 mg/dL — AB (ref 0.0–149.0)
Total CHOL/HDL Ratio: 5
VLDL: 42.6 mg/dL — ABNORMAL HIGH (ref 0.0–40.0)

## 2017-08-21 LAB — PSA: PSA: 1.25 ng/mL (ref 0.10–4.00)

## 2017-08-21 LAB — TSH: TSH: 3.63 u[IU]/mL (ref 0.35–4.50)

## 2017-08-21 LAB — LDL CHOLESTEROL, DIRECT: Direct LDL: 102 mg/dL

## 2017-08-21 MED ORDER — METOPROLOL SUCCINATE ER 25 MG PO TB24: 25.0000 mg | ORAL_TABLET | Freq: Every day | ORAL | 3 refills | Status: DC

## 2017-08-21 MED ORDER — FENOFIBRATE 160 MG PO TABS: 160.0000 mg | ORAL_TABLET | Freq: Every day | ORAL | 3 refills | Status: DC

## 2017-08-21 NOTE — Progress Notes (Signed)
   Subjective:    Patient ID: Justin Gallegos, male    DOB: [birth year], 84 y.o.   MRN: [id number]  HPI Here for a well exam. He is doing well in general. He walks wiith a cane for balance. He will see [name] next month to follow up on atrial fibrillation. He gets regular  Coumadin checks. He saw his Urologist recently and he is being treated for a UTI with Monurol. He does complain about pain in the left great toe due to a thick toenail.    Review of Systems  Constitutional: Negative.   HENT: Negative.   Eyes: Negative.   Respiratory: Negative.   Cardiovascular: Negative.   Gastrointestinal: Negative.   Genitourinary: Negative.   Musculoskeletal: Negative.   Skin: Negative.   Neurological: Negative.   Psychiatric/Behavioral: Negative.        Objective:   Physical Exam  Constitutional: He is oriented to person, place, and time. He appears well-developed and well-nourished. No distress.  HENT:  Head: Normocephalic and atraumatic.  Right Ear: External ear normal.  Left Ear: External ear normal.  Nose: Nose normal.  Mouth/Throat: Oropharynx is clear and moist. No oropharyngeal exudate.  Eyes: Conjunctivae and EOM are normal. Pupils are equal, round, and reactive to light. Right eye exhibits no discharge. Left eye exhibits no discharge. No scleral icterus.  Neck: Neck supple. No JVD present. No tracheal deviation present. No thyromegaly present.  Cardiovascular: Normal rate, regular rhythm, normal heart sounds and intact distal pulses. Exam reveals no gallop and no friction rub.  No murmur heard. Pulmonary/Chest: Effort normal and breath sounds normal. No respiratory distress. He has no wheezes. He has no rales. He exhibits no tenderness.  Abdominal: Soft. Bowel sounds are normal. He exhibits no distension and no mass. There is no tenderness. There is no rebound and no guarding.  Musculoskeletal: Normal range of motion. He exhibits no tenderness.  1+ edema in both ankles    Lymphadenopathy:    He has no cervical adenopathy.  Neurological: He is alert and oriented to person, place, and time. He has normal reflexes. No cranial nerve deficit. He exhibits normal muscle tone. Coordination normal.  Skin: Skin is warm and dry. No rash noted. He is not diaphoretic. No erythema. No pallor.  All toenails have fungal involvement. The left great toenail is quite thick and curved, and it is digging into the toe  Psychiatric: He has a normal mood and affect. His behavior is normal. Judgment and thought content normal.          Assessment & Plan:  Well exam. We discussed diet and exercise. Get fasting labs. Refer to Podiatry for a likely toenail removal. Alysia Penna, MD

## 2017-09-02 DIAGNOSIS — H43813 Vitreous degeneration, bilateral: Secondary | ICD-10-CM | POA: Diagnosis not present

## 2017-09-02 DIAGNOSIS — H3563 Retinal hemorrhage, bilateral: Secondary | ICD-10-CM | POA: Diagnosis not present

## 2017-09-02 DIAGNOSIS — H34232 Retinal artery branch occlusion, left eye: Secondary | ICD-10-CM | POA: Diagnosis not present

## 2017-09-02 DIAGNOSIS — H34833 Tributary (branch) retinal vein occlusion, bilateral, with macular edema: Secondary | ICD-10-CM | POA: Diagnosis not present

## 2017-09-02 LAB — HM DIABETES EYE EXAM

## 2017-09-03 ENCOUNTER — Encounter: Payer: Self-pay | Admitting: Podiatry

## 2017-09-03 ENCOUNTER — Ambulatory Visit: Payer: PPO | Admitting: Podiatry

## 2017-09-03 DIAGNOSIS — M79676 Pain in unspecified toe(s): Secondary | ICD-10-CM

## 2017-09-03 DIAGNOSIS — B351 Tinea unguium: Secondary | ICD-10-CM

## 2017-09-03 NOTE — Progress Notes (Signed)
   Subjective:    Patient ID: Justin Gallegos, male    DOB: 1929/06/13, 84 y.o.   MRN: [id number]  HPI  Chief Complaint  Patient presents with  . Ingrown Toenail    (NP) ingrown toenail on great toe left foot       Review of Systems  All other systems reviewed and are negative.      Objective:   Physical Exam        Assessment & Plan:

## 2017-09-05 ENCOUNTER — Telehealth: Payer: Self-pay | Admitting: *Deleted

## 2017-09-05 DIAGNOSIS — I739 Peripheral vascular disease, unspecified: Secondary | ICD-10-CM

## 2017-09-05 DIAGNOSIS — R8271 Bacteriuria: Secondary | ICD-10-CM | POA: Diagnosis not present

## 2017-09-05 DIAGNOSIS — I669 Occlusion and stenosis of unspecified cerebral artery: Secondary | ICD-10-CM

## 2017-09-05 DIAGNOSIS — E785 Hyperlipidemia, unspecified: Secondary | ICD-10-CM

## 2017-09-05 DIAGNOSIS — I779 Disorder of arteries and arterioles, unspecified: Secondary | ICD-10-CM

## 2017-09-05 MED ORDER — EZETIMIBE 10 MG PO TABS: 10.0000 mg | ORAL_TABLET | Freq: Every day | ORAL | 1 refills | Status: DC

## 2017-09-05 MED ORDER — ATORVASTATIN CALCIUM 80 MG PO TABS: 80.0000 mg | ORAL_TABLET | Freq: Every day | ORAL | 1 refills | Status: DC

## 2017-09-05 NOTE — Telephone Encounter (Signed)
I spoke with pt and his wife and gave them information from [name]. Carotid doppler set up for 1/29 at noon.   Pt is concerned about possibility of muscle aches on increased dose of atorvastatin. He is willing to try increased dose and will call us if he develops muscle aches.  Will send prescriptions for Atorvastatin and Zetia to [facility] in [place].  Pt will come into office for fasting lab work on April 12,2019. Pt has seen Dr. Tomi Likens in the past. Will make referral.

## 2017-09-05 NOTE — Telephone Encounter (Signed)
-----   Message from Dorris Carnes V, MD sent at 09/04/2017 10:15 PM EST ----- Review of note below recomm:  1  F/U carotid USN to evaluate  2  Increase lipitor to 80 and add Zetia 10  Need tighter control of LDL   Was around 100  Needs to be 7o or less 3  Refer back to neuro  Pt hs been seen before  Dx  Embolic events  INR has been therapeutic  ----- Message ----- From: Constance Haw, MD Sent: 09/03/2017   8:33 AM To: Fay Records, MD  Got a call from ophthalmology on this patient.  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  They wanted to let you know that this was happening and thought that either anticoagulation change versus a carotid ultrasound would be warranted.  He is planning to send you his note.  Thanks.

## 2017-09-06 NOTE — Progress Notes (Signed)
   Subjective: 84 year old male presenting today as a new patient with a chief complaint of possible fungus to the left great toenail that has been present for several months. He reports associated pain and discoloration. He has not done anything to treat the symptoms. Touching the nail increases the pain. Patient is here for further evaluation and treatment.   Past Medical History:  Diagnosis Date  . Arthritis   . Atrial fibrillation [facility])    sees [name]   . Cancer [facility])    ureteral, per Dr. Alinda Money   . Eye disease    sees [name]   . Hemorrhoids   . Hyperlipidemia   . Hypertension   . Low back pain   . TIA (transient ischemic attack)     Objective: Physical Exam General: The patient is alert and oriented x3 in no acute distress.  Dermatology: Hyperkeratotic, discolored, thickened, onychodystrophy of great toenails noted bilaterally.  Skin is warm, dry and supple bilateral lower extremities. Negative for open lesions or macerations.  Vascular: Palpable pedal pulses bilaterally. No edema or erythema noted. Capillary refill within normal limits.  Neurological: Epicritic and protective threshold grossly intact bilaterally.   Musculoskeletal Exam: Range of motion within normal limits to all pedal and ankle joints bilateral. Muscle strength 5/5 in all groups bilateral.   Assessment: #1 dystrophic bilateral great toenails   Plan of Care:  #1 Patient was evaluated. #2 Mechanical debridement of great toenails bilaterally performed using a nail nipper. Filed with dremel without incident.  #3 Return to clinic as needed.   Edrick Kins, DPM [facility]  Dr. Edrick Kins, [place]                                        [place],  [address]                Office [phone]  Fax [phone]

## 2017-09-09 ENCOUNTER — Ambulatory Visit (HOSPITAL_COMMUNITY)
Admission: RE | Admit: 2017-09-09 | Discharge: 2017-09-09 | Disposition: A | Discharge status: Home / Self Care | Payer: PPO | Source: Ambulatory Visit | Attending: Internal Medicine | Admitting: Internal Medicine

## 2017-09-09 DIAGNOSIS — I739 Peripheral vascular disease, unspecified: Secondary | ICD-10-CM

## 2017-09-09 DIAGNOSIS — I779 Disorder of arteries and arterioles, unspecified: Secondary | ICD-10-CM | POA: Diagnosis not present

## 2017-09-10 ENCOUNTER — Ambulatory Visit (INDEPENDENT_AMBULATORY_CARE_PROVIDER_SITE_OTHER): Payer: PPO | Admitting: General Practice

## 2017-09-10 DIAGNOSIS — Z7901 Long term (current) use of anticoagulants: Secondary | ICD-10-CM

## 2017-09-10 DIAGNOSIS — I4891 Unspecified atrial fibrillation: Secondary | ICD-10-CM

## 2017-09-10 LAB — POCT INR: INR: 2.9

## 2017-09-10 NOTE — Patient Instructions (Addendum)
Pre visit review using our clinic review tool, if applicable. No additional management support is needed unless otherwise documented below in the visit note.,h  Continue to take 5 mg on Sun/Tues/Thurs.  and take 2.5 mg on Monday and Fridays and Saturdays and take 1 mg on Wednesdays.  Re-check in 4 weeks.

## 2017-09-11 ENCOUNTER — Encounter: Payer: Self-pay | Admitting: Internal Medicine

## 2017-09-19 ENCOUNTER — Encounter (INDEPENDENT_AMBULATORY_CARE_PROVIDER_SITE_OTHER): Payer: Self-pay

## 2017-09-19 ENCOUNTER — Encounter: Payer: Self-pay | Admitting: Internal Medicine

## 2017-09-19 ENCOUNTER — Ambulatory Visit: Payer: PPO | Admitting: Internal Medicine

## 2017-09-19 VITALS — BP 142/78 | HR 106 | Ht 70.0 in | Wt 207.4 lb

## 2017-09-19 DIAGNOSIS — I4891 Unspecified atrial fibrillation: Secondary | ICD-10-CM | POA: Diagnosis not present

## 2017-09-19 DIAGNOSIS — E782 Mixed hyperlipidemia: Secondary | ICD-10-CM | POA: Diagnosis not present

## 2017-09-19 DIAGNOSIS — R29898 Other symptoms and signs involving the musculoskeletal system: Secondary | ICD-10-CM

## 2017-09-19 DIAGNOSIS — I679 Cerebrovascular disease, unspecified: Secondary | ICD-10-CM | POA: Diagnosis not present

## 2017-09-19 MED ORDER — ATORVASTATIN CALCIUM 80 MG PO TABS: 40.0000 mg | ORAL_TABLET | Freq: Every day | ORAL | Status: DC

## 2017-09-19 NOTE — Progress Notes (Signed)
Cardiology Office Note   Date:  09/19/2017   ID:  Justin Gallegos July 17, 1929, MRN [id number]  PCP:  Justin Morale, MD  Cardiologist:   Justin Carnes, MD   F/u of atrial fib      History of Present Illness: Justin Gallegos is a 84 y.o. male with a history of atrial fib, HL  I saw him in Dec 2017  SInce seen he denies palpitations  Breathing is OK   No Cp   Activtiy is limited by weakness in legs  Uses Cane   Sees trainer 2x per week  Convinced he is weaker since lipitor increased to 80 mg    Outpatient Medications Prior to Visit  Medication Sig Dispense Refill  . Aflibercept (EYLEA IO) Inject into the eye.    [name] atorvastatin (LIPITOR) 80 MG tablet Take 1 tablet (80 mg total) by mouth daily. 90 tablet 1  . ezetimibe (ZETIA) 10 MG tablet Take 1 tablet (10 mg total) by mouth daily. 90 tablet 1  . fenofibrate 160 MG tablet Take 1 tablet (160 mg total) by mouth daily. 90 tablet 3  . fluticasone (FLONASE) 50 MCG/ACT nasal spray USE TWO SPRAYS IN EACH NOSTRIL AS NEEDED (Patient taking differently: USE TWO SPRAYS IN EACH NOSTRIL AS NEEDED over the counter) 16 g 0  . Fosfomycin Tromethamine (MONUROL PO) Take by mouth.    . metoprolol succinate (TOPROL-XL) 25 MG 24 hr tablet Take 1 tablet (25 mg total) by mouth daily. 90 tablet 3  . warfarin (COUMADIN) 1 MG tablet Take as directed by anticoagulation clinic.  Use in addition to 5 mg tablets. 30 tablet 0  . warfarin (COUMADIN) 5 MG tablet TAKE AS DIRECTED BY  ANTICOAGULATION  CLINIC 90 tablet 1   No facility-administered medications prior to visit.      Allergies:   Penicillins and Amoxicillin-pot clavulanate   Past Medical History:  Diagnosis Date  . Arthritis   . Atrial fibrillation [facility])    sees Justin. Dorris Gallegos   . Cancer [facility])    ureteral, per Justin. Alinda Gallegos   . Eye disease    sees Justin. Sherlynn Gallegos   . Hemorrhoids   . Hyperlipidemia   . Hypertension   . Low back pain   . TIA (transient ischemic attack)     Past Surgical  History:  Procedure Laterality Date  . APPENDECTOMY    . CATARACT EXTRACTION, BILATERAL  2013   per Justin. Katy Gallegos   . COLONOSCOPY  06-28-10   per Justin. Carlean Gallegos, benign polyps and severe diverticulosis, no repeats needed   . HERNIA REPAIR    . NEPHRECTOMY    . TONSILLECTOMY    . URETERECTOMY       Social History:  The patient  reports that  has never smoked. he has never used smokeless tobacco. He reports that he does not drink alcohol or use drugs.   Family History:  The patient's family history includes Coronary artery disease in his unknown relative; Stroke in his father and unknown relative.    ROS:  Please see the history of present illness. All other systems are reviewed and  Negative to the above problem except as noted.    PHYSICAL EXAM: VS:  BP (!) 142/78   Pulse (!) 106   Ht 5\' 10"  (1.778 m)   Wt 207 lb 6.4 oz (94.1 kg)   BMI 29.76 kg/m   GEN: Well nourished, well developed, in no acute distress  HEENT: normal  Neck: no JVD, carotid bruits, or masses Cardiac: irreg irreg  ; no murmurs, rubs, or gallops,no edema  Respiratory:  Rhonchi LLL  Clears some  No wheezes  No ralers GI: soft, nontender, nondistended, + BS  No hepatomegaly  MS: no deformity Moving all extremities   Skin: warm and dry, no rash Neuro:Deferred   Psych: euthymic mood, full affect   EKG:  EKG is ordered today.  Atrial fib  106 npm     Lipid Panel    Component Value Date/Time   CHOL 156 08/21/2017 0950   TRIG 213.0 (H) 08/21/2017 0950   TRIG 178 (H) 07/24/2006 0829   HDL 33.00 (L) 08/21/2017 0950   CHOLHDL 5 08/21/2017 0950   VLDL 42.6 (H) 08/21/2017 0950   LDLCALC 99 07/18/2016 0953   LDLDIRECT 102.0 08/21/2017 0950      Wt Readings from Last 3 Encounters:  09/19/17 207 lb 6.4 oz (94.1 kg)  08/21/17 209 lb 12.8 oz (95.2 kg)  06/13/17 205 lb 9.6 oz (93.3 kg)      ASSESSMENT AND PLAN:  1  Atrial fib   Keep on current meds  INR followed byDr Sarajane Gallegos    2  CV dz  Mild by USN  3  HL   Optimally LDL would be lower   He attrib weakness to higher lipitor dose  Said he also felt like this from Crestor  Rec:  Hold lipitor for 2 wks then resume at 40 mg    Continue Zetia   Call if problems He has not had any ischemic events  At 88 I would not push for repatha    4  LE weakness  Gave option for referral to balance rehab  They wll call if interested    F/U in 1 year    Current medicines are reviewed at length with the patient today.  The patient does not have concerns regarding medicines.  Signed, Justin Carnes, MD  09/19/2017 10:58 AM    [address], [place], [place]  [address] Phone: [phone]; Fax: [phone]

## 2017-09-19 NOTE — Patient Instructions (Signed)
Your physician has recommended you make the following change in your medication:  1.) hold atorvastatin (Lipitor) for 2 weeks, then resume 40 mg once a day for cholesterol  Call our office If you are interested in vestibular rehab (balance therapy).

## 2017-10-07 ENCOUNTER — Ambulatory Visit: Payer: PPO | Admitting: Neurology

## 2017-10-07 NOTE — Progress Notes (Addendum)
Subjective:   Justin Gallegos is a 84 y.o. male who presents for Medicare Annual/Subsequent preventive examination.  Reports health as pretty good Lives with spouse  Wife is 70  Has 2 steps from the den to kitchen One step down to go to [employer] blood clot in left eye; poor vision in both eyes Shots to eye continues  Uses a cane outside the home [place]; has been x 2 years    Last OV with [name] CV 09/19/2017  1/10 /19 with [name]  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  PSA 08/2017 WNL  Cardiac Risk Factors include: advanced age (>30men, >50 women);dyslipidemia;family history of premature cardiovascular disease;hypertension;male genderEyes Dr.       [name]:    Vitals: BP 132/80   Pulse 100   Ht 5\' 11"  (1.803 m)   Wt 205 lb (93 kg)   SpO2 98%   BMI 28.59 kg/m   Body mass index is 28.59 kg/m.  Advanced Directives 10/08/2017  Does Patient Have a Medical Advance Directive? No   Advanced Directive; Reviewed advanced directive and agreed to receipt of information and discussion.  Focused face to face x  20 minutes discussing HCPOA and Living will and reviewed all the questions in the [place] forms. The patient voices understanding of HCPOA; LW reviewed and information provided on each question. Educated on how to revoke this HCPOA or LW at any time.   Also  discussed life prolonging measures (given a few examples) and where he could choose to initiate or not;  the ability to given the HCPOA power to change his living will or not if he cannot speak for himself; as well as finalizing  the will by 2 unrelated witnesses and notary.  Will call for questions and given information on [facility] pastoral department for further assistance.      Tobacco Social History   Tobacco Use  Smoking Status Never Smoker  Smokeless Tobacco Never Used     Counseling given: Yes   Clinical Intake:   Past Medical History:  Diagnosis Date  . Arthritis   . Atrial fibrillation [facility])    sees [name]   . Cancer [facility])    ureteral, per Dr. Alinda Money   . Eye disease    sees [name]   . Hemorrhoids   . Hyperlipidemia   . Hypertension   . Low back pain   . TIA (transient ischemic attack)    Past Surgical History:  Procedure Laterality Date  . APPENDECTOMY    . CATARACT EXTRACTION, BILATERAL  2013   per [name]   . COLONOSCOPY  06-28-10   per [name], benign polyps and severe diverticulosis, no repeats needed   . HERNIA REPAIR    . NEPHRECTOMY    . TONSILLECTOMY    . URETERECTOMY     Family History  Problem Relation Age of Onset  . Stroke Father   . Coronary artery disease Unknown  fhx  . Stroke Unknown        fhx   Social History   Socioeconomic History  . Marital status: Married    Spouse name: Not on file  . Number of children: Not on file  . Years of education: Not on file  . Highest education level: Not on file  Social Needs  . Financial resource strain: Not on file  . Food insecurity - worry: Not on file  . Food insecurity - inability: Not on file  . Transportation needs - medical: Not on file  . Transportation needs - non-medical: Not on file  Occupational History  . Not on file  Tobacco Use  . Smoking status: Never Smoker  . Smokeless tobacco: Never Used  Substance and Sexual Activity  . Alcohol use: No    Alcohol/week: 0.0 oz  . Drug use: No  . Sexual activity: Not on file  Other Topics Concern  . Not on file  Social History Narrative  . Not on file    Outpatient Encounter Medications as of 10/08/2017  Medication  Sig  . Aflibercept (EYLEA IO) Inject into the eye.  [name] atorvastatin (LIPITOR) 80 MG tablet Take 0.5 tablets (40 mg total) by mouth daily.  [name] ezetimibe (ZETIA) 10 MG tablet Take 1 tablet (10 mg total) by mouth daily.  . fenofibrate 160 MG tablet Take 1 tablet (160 mg total) by mouth daily.  . fluticasone (FLONASE) 50 MCG/ACT nasal spray USE TWO SPRAYS IN EACH NOSTRIL AS NEEDED (Patient taking differently: USE TWO SPRAYS IN EACH NOSTRIL AS NEEDED over the counter)  . metoprolol succinate (TOPROL-XL) 25 MG 24 hr tablet Take 1 tablet (25 mg total) by mouth daily.  [name] warfarin (COUMADIN) 1 MG tablet Take as directed by anticoagulation clinic.  Use in addition to 5 mg tablets.  . warfarin (COUMADIN) 5 MG tablet TAKE AS DIRECTED BY  ANTICOAGULATION  CLINIC  . Fosfomycin Tromethamine (MONUROL PO) Take by mouth.   No facility-administered encounter medications on file as of 10/08/2017.     Activities of Daily Living In your present state of health, do you have any difficulty performing the following activities: 10/08/2017  Hearing? N  Vision? N  Difficulty concentrating or making decisions? N  Walking or climbing stairs? N  Dressing or bathing? N  Doing errands, shopping? N  Preparing Food and eating ? N  Comment prepares his breakfast   Using the Toilet? N  In the past six months, have you accidently leaked urine? N  Do you have problems with loss of bowel control? N  Managing your Medications? N  Managing your Finances? N  Housekeeping or managing your Housekeeping? N  Some recent data might be hidden    Patient Care Team: Laurey Morale, MD as PCP - General   Assessment:   This is a routine wellness examination for [place].  Exercise Activities and Dietary recommendations Current Exercise Habits: Home exercise routine;Structured exercise class, Type of exercise: strength training/weights, Time (Minutes): 60, Frequency (Times/Week): 2, Weekly Exercise (Minutes/Week): 120, Intensity:  Moderate  Goals    . Exercise 150 min/wk Moderate Activity     Goal is to be 89 and continue with exercise        Fall Risk Fall Risk  10/08/2017 08/25/2015 07/14/2014  Falls in the past year? No No No   Is the patient's home free of loose throw rugs in walkways, pet beds, electrical cords, etc?  yes       Timed  Get Up and Go Performed: very good; has personal trainer x 2 and requires he get up and down from a seated position x 10 x 3 sets  Depression Screen PHQ 2/9 Scores 10/08/2017 08/25/2015 07/14/2014  PHQ - 2 Score 0 0 0    Cognitive Function Ad8 score reviewed for issues:  Issues making decisions:  Less interest in hobbies / activities:  Repeats questions, stories (family complaining):  Trouble using ordinary gadgets (microwave, computer, phone):  Forgets the month or year:   Mismanaging finances:   Remembering appts:  Daily problems with thinking and/or memory: Ad8 score is=0  MMSE - Mini Mental State Exam 10/08/2017  Not completed: (No Data)       Immunization History  Administered Date(s) Administered  . H1N1 07/21/2008  . Influenza Split 06/18/2011  . Influenza Whole 05/13/2007, 05/10/2008, 05/16/2009, 05/01/2010  . Influenza, High Dose Seasonal PF 05/10/2015, 04/17/2016  . Influenza,inj,Quad PF,6+ Mos 05/27/2013, 05/11/2014  . Pneumococcal Conjugate-13 05/11/2014  . Pneumococcal Polysaccharide-23 05/12/2005  . Td 11/02/2007  . Zoster 07/09/2006     Screening Tests Health Maintenance  Topic Date Due  . TETANUS/TDAP  11/01/2017  . INFLUENZA VACCINE  Completed  . PNA vac Low Risk Adult  Completed    Plan:      PCP Notes   Health Maintenance Educated regarding shingrix Educated regarding Tetanus (Tdap ) due in March  Goal is to continue personal exercise and live to be 89   Abnormal Screens  Mild difficulty hearing Given information regarding free hearing aid Discussed [employer] benefits but not interested at this time.   Referrals    none  Patient concerns; No   Nurse Concerns; Very engaging;   Next PCP apt 11/19/2017       I have personally reviewed and noted the following in the patient's chart:   . Medical and social history . Use of alcohol, tobacco or illicit drugs  . Current medications and supplements . Functional ability and status . Nutritional status . Physical activity . Advanced directives . List of other physicians . Hospitalizations, surgeries, and ER visits in previous 12 months . Vitals . Screenings to include cognitive, depression, and falls . Referrals and appointments  In addition, I have reviewed and discussed with patient certain preventive protocols, quality metrics, and best practice recommendations. A written personalized care plan for preventive services as well as general preventive health recommendations were provided to patient.     ASNKN,[email or web address], RN  10/08/2017  I have reviewed this note and agree with its contents.  Alysia Penna, MD

## 2017-10-08 ENCOUNTER — Ambulatory Visit (INDEPENDENT_AMBULATORY_CARE_PROVIDER_SITE_OTHER): Payer: PPO

## 2017-10-08 ENCOUNTER — Ambulatory Visit (INDEPENDENT_AMBULATORY_CARE_PROVIDER_SITE_OTHER): Payer: PPO | Admitting: General Practice

## 2017-10-08 VITALS — BP 132/80 | HR 100 | Ht 71.0 in | Wt 205.0 lb

## 2017-10-08 DIAGNOSIS — Z0000 Encounter for general adult medical examination without abnormal findings: Secondary | ICD-10-CM | POA: Diagnosis not present

## 2017-10-08 DIAGNOSIS — I4891 Unspecified atrial fibrillation: Secondary | ICD-10-CM

## 2017-10-08 DIAGNOSIS — Z7901 Long term (current) use of anticoagulants: Secondary | ICD-10-CM

## 2017-10-08 LAB — POCT INR: INR: 3

## 2017-10-08 NOTE — Patient Instructions (Signed)
Pre visit review using our clinic review tool, if applicable. No additional management support is needed unless otherwise documented below in the visit note.  Continue to take 5 mg on Sun/Tues/Thurs.  and take 2.5 mg on Monday and Fridays and Saturdays and take 1 mg on Wednesdays.  Re-check in 4 weeks.

## 2017-10-08 NOTE — Patient Instructions (Addendum)
Justin Gallegos , Thank you for taking time to come for your Medicare Wellness Visit. I appreciate your ongoing commitment to your health goals. Please review the following plan we discussed and let me know if I can assist you in the future.  Deaf & Hard of Hearing Division Services - can assist with hearing aid x 1  No reviews  [employer] Office  [place] [address]  [phone]  Veterans want to access their VA benefits can call [facility]  [address] [place], [place] [address]  Phone: [phone] Or [phone] Fax: [phone]  OR   They can to to the [facility] office and call [phone] and If the prompt starts, hit 0 and then ask for eligibility. They will assist you to sign up for medical benefits.    [email or web address]-[email or web address]_How_to_Buy_[email or web address]_Aids.pdf   Shingrix is a vaccine for the prevention of Shingles in Adults 50 and older.  If you are on Medicare, you can request a prescription from your doctor to be filled at a pharmacy.  Please check with your benefits regarding applicable copays or out of pocket expenses.  The Shingrix is given in 2 vaccines approx 8 weeks apart. You must receive the 2nd dose prior to 6 months from receipt of the first.   Recommend Dermatology every year  A Tetanus is recommended every 10 years. Medicare covers a tetanus if you have a cut or wound; otherwise, there may be a charge. If you had not had a tetanus with pertusses, known as the Tdap, you can take this anytime.     Prevention of falls: Remove rugs or any tripping hazards in the home Use Non slip mats in bathtubs and showers Placing grab bars next to the toilet and or shower Placing handrails on both sides of the stair way Adding extra lighting in the home.   Personal safety issues reviewed:  1. Consider starting a community watch program per [facility] 2.   Changes batteries is smoke detector and/or carbon monoxide detector  3.  If you have firearms; keep them in a safe place 4.  Wear protection when in the sun; Always wear sunscreen or a hat; It is good to have your doctor check your skin annually or review any new areas of concern 5. Driving safety; Keep in the right lane; stay 3 car lengths behind the car in front of you on the highway; look 3 times prior to pulling out; carry your cell phone everywhere you go!    Learn about the Yellow Dot program:  The program allows first responders at your emergency to have access to who your physician is, as well as your medications and medical conditions.  Citizens requesting the Yellow Dot Packages should contact Master Corporal Nunzio Cobbs at the [facility] [phone] for the first week of the program and beginning the week after Easter citizens should contact their Scientist, physiological.    These are the goals we discussed: Goals    . Exercise 150 min/wk Moderate Activity     Goal is to be 89 and continue with exercise        This is a list of the screening recommended for you and due dates:  Health Maintenance  Topic Date Due  . Tetanus Vaccine  11/01/2017  . Flu Shot  Completed  . Pneumonia vaccines  Completed      Fall Prevention in the Home Falls can cause injuries. They  can happen to people of all ages. There are many things you can do to make your home safe and to help prevent falls. What can I do on the outside of my home?  Regularly fix the edges of walkways and driveways and fix any cracks.  Remove anything that might make you trip as you walk through a door, such as a raised step or threshold.  Trim any bushes or trees on the path to your home.  Use bright outdoor lighting.  Clear any walking paths of anything that might make someone trip, such as rocks or tools.  Regularly check to see if handrails are loose or broken. Make sure that  both sides of any steps have handrails.  Any raised decks and porches should have guardrails on the edges.  Have any leaves, snow, or ice cleared regularly.  Use sand or salt on walking paths during winter.  Clean up any spills in your garage right away. This includes oil or grease spills. What can I do in the bathroom?  Use night lights.  Install grab bars by the toilet and in the tub and shower. Do not use towel bars as grab bars.  Use non-skid mats or decals in the tub or shower.  If you need to sit down in the shower, use a plastic, non-slip stool.  Keep the floor dry. Clean up any water that spills on the floor as soon as it happens.  Remove soap buildup in the tub or shower regularly.  Attach bath mats securely with double-sided non-slip rug tape.  Do not have throw rugs and other things on the floor that can make you trip. What can I do in the bedroom?  Use night lights.  Make sure that you have a light by your bed that is easy to reach.  Do not use any sheets or blankets that are too big for your bed. They should not hang down onto the floor.  Have a firm chair that has side arms. You can use this for support while you get dressed.  Do not have throw rugs and other things on the floor that can make you trip. What can I do in the kitchen?  Clean up any spills right away.  Avoid walking on wet floors.  Keep items that you use a lot in easy-to-reach places.  If you need to reach something above you, use a strong step stool that has a grab bar.  Keep electrical cords out of the way.  Do not use floor polish or wax that makes floors slippery. If you must use wax, use non-skid floor wax.  Do not have throw rugs and other things on the floor that can make you trip. What can I do with my stairs?  Do not leave any items on the stairs.  Make sure that there are handrails on both sides of the stairs and use them. Fix handrails that are broken or loose. Make sure  that handrails are as long as the stairways.  Check any carpeting to make sure that it is firmly attached to the stairs. Fix any carpet that is loose or worn.  Avoid having throw rugs at the top or bottom of the stairs. If you do have throw rugs, attach them to the floor with carpet tape.  Make sure that you have a light switch at the top of the stairs and the bottom of the stairs. If you do not have them, ask someone to add them for  you. What else can I do to help prevent falls?  Wear shoes that: ? Do not have high heels. ? Have rubber bottoms. ? Are comfortable and fit you well. ? Are closed at the toe. Do not wear sandals.  If you use a stepladder: ? Make sure that it is fully opened. Do not climb a closed stepladder. ? Make sure that both sides of the stepladder are locked into place. ? Ask someone to hold it for you, if possible.  Clearly mark and make sure that you can see: ? Any grab bars or handrails. ? First and last steps. ? Where the edge of each step is.  Use tools that help you move around (mobility aids) if they are needed. These include: ? Canes. ? Walkers. ? Scooters. ? Crutches.  Turn on the lights when you go into a dark area. Replace any light bulbs as soon as they burn out.  Set up your furniture so you have a clear path. Avoid moving your furniture around.  If any of your floors are uneven, fix them.  If there are any pets around you, be aware of where they are.  Review your medicines with your doctor. Some medicines can make you feel dizzy. This can increase your chance of falling. Ask your doctor what other things that you can do to help prevent falls. This information is not intended to replace advice given to you by your health care provider. Make sure you discuss any questions you have with your health care provider. Document Released: 05/25/2009 Document Revised: 01/04/2016 Document Reviewed: 09/02/2014 Elsevier Interactive Patient Education   2018 [employer] Maintenance, Male A healthy lifestyle and preventive care is important for your health and wellness. Ask your health care provider about what schedule of regular examinations is right for you. What should I know about weight and diet? Eat a Healthy Diet  Eat plenty of vegetables, fruits, whole grains, low-fat dairy products, and lean protein.  Do not eat a lot of foods high in solid fats, added sugars, or salt.  Maintain a Healthy Weight Regular exercise can help you achieve or maintain a healthy weight. You should:  Do at least 150 minutes of exercise each week. The exercise should increase your heart rate and make you sweat (moderate-intensity exercise).  Do strength-training exercises at least twice a week.  Watch Your Levels of Cholesterol and Blood Lipids  Have your blood tested for lipids and cholesterol every 5 years starting at 82 years of age. If you are at high risk for heart disease, you should start having your blood tested when you are 82 years old. You may need to have your cholesterol levels checked more often if: ? Your lipid or cholesterol levels are high. ? You are older than 82 years of age. ? You are at high risk for heart disease.  What should I know about cancer screening? Many types of cancers can be detected early and may often be prevented. Lung Cancer  You should be screened every year for lung cancer if: ? You are a current smoker who has smoked for at least 30 years. ? You are a former smoker who has quit within the past 15 years.  Talk to your health care provider about your screening options, when you should start screening, and how often you should be screened.  Colorectal Cancer  Routine colorectal cancer screening usually begins at 82 years of age and should be repeated every 5-10 years  until you are 82 years old. You may need to be screened more often if early forms of precancerous polyps or small growths are found.  Your health care provider may recommend screening at an earlier age if you have risk factors for colon cancer.  Your health care provider may recommend using home test kits to check for hidden blood in the stool.  A small camera at the end of a tube can be used to examine your colon (sigmoidoscopy or colonoscopy). This checks for the earliest forms of colorectal cancer.  Prostate and Testicular Cancer  Depending on your age and overall health, your health care provider may do certain tests to screen for prostate and testicular cancer.  Talk to your health care provider about any symptoms or concerns you have about testicular or prostate cancer.  Skin Cancer  Check your skin from head to toe regularly.  Tell your health care provider about any new moles or changes in moles, especially if: ? There is a change in a mole's size, shape, or color. ? You have a mole that is larger than a pencil eraser.  Always use sunscreen. Apply sunscreen liberally and repeat throughout the day.  Protect yourself by wearing long sleeves, pants, a wide-brimmed hat, and sunglasses when outside.  What should I know about heart disease, diabetes, and high blood pressure?  If you are 31-34 years of age, have your blood pressure checked every 3-5 years. If you are 84 years of age or older, have your blood pressure checked every year. You should have your blood pressure measured twice-once when you are at a hospital or clinic, and once when you are not at a hospital or clinic. Record the average of the two measurements. To check your blood pressure when you are not at a hospital or clinic, you can use: ? An automated blood pressure machine at a pharmacy. ? A home blood pressure monitor.  Talk to your health care provider about your target blood pressure.  If you are between 50-85 years old, ask your health care provider if you should take aspirin to prevent heart disease.  Have regular diabetes screenings by  checking your fasting blood sugar level. ? If you are at a normal weight and have a low risk for diabetes, have this test once every three years after the age of 30. ? If you are overweight and have a high risk for diabetes, consider being tested at a younger age or more often.  A one-time screening for abdominal aortic aneurysm (AAA) by ultrasound is recommended for men aged 41-75 years who are current or former smokers. What should I know about preventing infection? Hepatitis B If you have a higher risk for hepatitis B, you should be screened for this virus. Talk with your health care provider to find out if you are at risk for hepatitis B infection. Hepatitis C Blood testing is recommended for:  Everyone born from [birth year] through 1963.  Anyone with known risk factors for hepatitis C.  Sexually Transmitted Diseases (STDs)  You should be screened each year for STDs including gonorrhea and chlamydia if: ? You are sexually active and are younger than 82 years of age. ? You are older than 82 years of age and your health care provider tells you that you are at risk for this type of infection. ? Your sexual activity has changed since you were last screened and you are at an increased risk for chlamydia or gonorrhea. Ask your health care  provider if you are at risk.  Talk with your health care provider about whether you are at high risk of being infected with HIV. Your health care provider may recommend a prescription medicine to help prevent HIV infection.  What else can I do?  Schedule regular health, dental, and eye exams.  Stay current with your vaccines (immunizations).  Do not use any tobacco products, such as cigarettes, chewing tobacco, and e-cigarettes. If you need help quitting, ask your health care provider.  Limit alcohol intake to no more than 2 drinks per day. One drink equals 12 ounces of beer, 5 ounces of wine, or 1 ounces of hard liquor.  Do not use street drugs.  Do not  share needles.  Ask your health care provider for help if you need support or information about quitting drugs.  Tell your health care provider if you often feel depressed.  Tell your health care provider if you have ever been abused or do not feel safe at home. This information is not intended to replace advice given to you by your health care provider. Make sure you discuss any questions you have with your health care provider. Document Released: 01/25/2008 Document Revised: 03/27/2016 Document Reviewed: 05/02/2015 Elsevier Interactive Patient Education  2018 [employer].   Hearing Loss Hearing loss is a partial or total loss of the ability to hear. This can be temporary or permanent, and it can happen in one or both ears. Hearing loss may be referred to as deafness. Medical care is necessary to treat hearing loss properly and to prevent the condition from getting worse. Your hearing may partially or completely come back, depending on what caused your hearing loss and how severe it is. In some cases, hearing loss is permanent. What are the causes? Common causes of hearing loss include:  Too much wax in the ear canal.  Infection of the ear canal or middle ear.  Fluid in the middle ear.  Injury to the ear or surrounding area.  An object stuck in the ear.  Prolonged exposure to loud sounds, such as music.  Less common causes of hearing loss include:  Tumors in the ear.  Viral or bacterial infections, such as meningitis.  A hole in the eardrum (perforated eardrum).  Problems with the hearing nerve that sends signals between the brain and the ear.  Certain medicines.  What are the signs or symptoms? Symptoms of this condition may include:  Difficulty telling the difference between sounds.  Difficulty following a conversation when there is background noise.  Lack of response to sounds in your environment. This may be most noticeable when you do not respond to startling  sounds.  Needing to turn up the volume on the television, radio, etc.  Ringing in the ears.  Dizziness.  Pain in the ears.  How is this diagnosed? This condition is diagnosed based on a physical exam and a hearing test (audiometry). The audiometry test will be performed by a hearing specialist (audiologist). You may also be referred to an ear, nose, and throat (ENT) specialist (otolaryngologist). How is this treated? Treatment for recent onset of hearing loss may include:  Ear wax removal.  Being prescribed medicines to prevent infection (antibiotics).  Being prescribed medicines to reduce inflammation (corticosteroids).  Follow these instructions at home:  If you were prescribed an antibiotic medicine, take it as told by your health care provider. Do not stop taking the antibiotic even if you start to feel better.  Take over-the-counter and  prescription medicines only as told by your health care provider.  Avoid loud noises.  Return to your normal activities as told by your health care provider. Ask your health care provider what activities are safe for you.  Keep all follow-up visits as told by your health care provider. This is important. Contact a health care provider if:  You feel dizzy.  You develop new symptoms.  You vomit or feel nauseous.  You have a fever. Get help right away if:  You develop sudden changes in your vision.  You have severe ear pain.  You have new or increased weakness.  You have a severe headache. This information is not intended to replace advice given to you by your health care provider. Make sure you discuss any questions you have with your health care provider. Document Released: 07/29/2005 Document Revised: 01/04/2016 Document Reviewed: 12/14/2014 Elsevier Interactive Patient Education  2018 [employer].

## 2017-11-03 ENCOUNTER — Other Ambulatory Visit: Payer: Self-pay | Admitting: Family Medicine

## 2017-11-04 DIAGNOSIS — H3582 Retinal ischemia: Secondary | ICD-10-CM | POA: Diagnosis not present

## 2017-11-04 DIAGNOSIS — H34831 Tributary (branch) retinal vein occlusion, right eye, with macular edema: Secondary | ICD-10-CM | POA: Diagnosis not present

## 2017-11-04 DIAGNOSIS — H348322 Tributary (branch) retinal vein occlusion, left eye, stable: Secondary | ICD-10-CM | POA: Diagnosis not present

## 2017-11-04 DIAGNOSIS — H34232 Retinal artery branch occlusion, left eye: Secondary | ICD-10-CM | POA: Diagnosis not present

## 2017-11-19 ENCOUNTER — Ambulatory Visit (INDEPENDENT_AMBULATORY_CARE_PROVIDER_SITE_OTHER): Payer: PPO | Admitting: General Practice

## 2017-11-19 ENCOUNTER — Other Ambulatory Visit: Payer: Self-pay | Admitting: General Practice

## 2017-11-19 DIAGNOSIS — I4891 Unspecified atrial fibrillation: Secondary | ICD-10-CM

## 2017-11-19 DIAGNOSIS — Z7901 Long term (current) use of anticoagulants: Secondary | ICD-10-CM

## 2017-11-19 LAB — POCT INR: INR: 2.7

## 2017-11-19 MED ORDER — WARFARIN SODIUM 2.5 MG PO TABS: ORAL_TABLET | ORAL | 0 refills | Status: DC

## 2017-11-19 NOTE — Patient Instructions (Addendum)
Pre visit review using our clinic review tool, if applicable. No additional management support is needed unless otherwise documented below in the visit note.  Continue to take 2 (2/5 mg) tablets on Sun/Tues/Thurs.  and take 1 (2.5 mg tablet) on Monday and Fridays and Saturdays and take 1 mg on Wednesdays.  Re-check in 4 weeks.

## 2017-11-21 ENCOUNTER — Other Ambulatory Visit: Payer: PPO

## 2017-11-21 DIAGNOSIS — E785 Hyperlipidemia, unspecified: Secondary | ICD-10-CM | POA: Diagnosis not present

## 2017-11-21 LAB — LIPID PANEL
Chol/HDL Ratio: 4.8 ratio (ref 0.0–5.0)
Cholesterol, Total: 134 mg/dL (ref 100–199)
HDL: 28 mg/dL — AB (ref >39)
LDL Calculated: 70 mg/dL (ref 0–99)
Triglycerides: 178 mg/dL — ABNORMAL HIGH (ref 0–149)
VLDL CHOLESTEROL CAL: 36 mg/dL (ref 5–40)

## 2017-12-10 DIAGNOSIS — H43813 Vitreous degeneration, bilateral: Secondary | ICD-10-CM | POA: Diagnosis not present

## 2017-12-10 DIAGNOSIS — H3582 Retinal ischemia: Secondary | ICD-10-CM | POA: Diagnosis not present

## 2017-12-10 DIAGNOSIS — H34833 Tributary (branch) retinal vein occlusion, bilateral, with macular edema: Secondary | ICD-10-CM | POA: Diagnosis not present

## 2017-12-10 DIAGNOSIS — H34232 Retinal artery branch occlusion, left eye: Secondary | ICD-10-CM | POA: Diagnosis not present

## 2017-12-23 ENCOUNTER — Other Ambulatory Visit: Payer: Self-pay | Admitting: General Practice

## 2017-12-23 ENCOUNTER — Other Ambulatory Visit: Payer: Self-pay | Admitting: Family Medicine

## 2017-12-23 DIAGNOSIS — H34831 Tributary (branch) retinal vein occlusion, right eye, with macular edema: Secondary | ICD-10-CM | POA: Diagnosis not present

## 2017-12-23 MED ORDER — WARFARIN SODIUM 1 MG PO TABS: ORAL_TABLET | ORAL | 0 refills | Status: DC

## 2017-12-23 NOTE — Telephone Encounter (Signed)
Please advise 

## 2017-12-24 NOTE — Telephone Encounter (Signed)
Last OV 08/21/17, No future OV  Last filled 12/23/17, #30 with 0 refills  Patient/pharmacy is requesting 90 day supply  Please advise.

## 2017-12-31 ENCOUNTER — Ambulatory Visit: Payer: PPO

## 2017-12-31 ENCOUNTER — Ambulatory Visit (INDEPENDENT_AMBULATORY_CARE_PROVIDER_SITE_OTHER): Payer: PPO | Admitting: General Practice

## 2017-12-31 DIAGNOSIS — Z7901 Long term (current) use of anticoagulants: Secondary | ICD-10-CM

## 2017-12-31 DIAGNOSIS — I4891 Unspecified atrial fibrillation: Secondary | ICD-10-CM

## 2017-12-31 LAB — POCT INR: INR: 2.6 (ref 2.0–3.0)

## 2017-12-31 NOTE — Patient Instructions (Addendum)
Pre visit review using our clinic review tool, if applicable. No additional management support is needed unless otherwise documented below in the visit note.  Continue to take 2 (2/5 mg) tablets on Sun/Tues/Thurs.  and take 1 (2.5 mg tablet) on Monday and Fridays and Saturdays and take 1 mg on Wednesdays.  Re-check in 6 weeks.  

## 2018-01-20 ENCOUNTER — Ambulatory Visit: Payer: PPO | Admitting: Neurology

## 2018-02-03 DIAGNOSIS — H3582 Retinal ischemia: Secondary | ICD-10-CM | POA: Diagnosis not present

## 2018-02-03 DIAGNOSIS — H34833 Tributary (branch) retinal vein occlusion, bilateral, with macular edema: Secondary | ICD-10-CM | POA: Diagnosis not present

## 2018-02-03 DIAGNOSIS — H34233 Retinal artery branch occlusion, bilateral: Secondary | ICD-10-CM | POA: Diagnosis not present

## 2018-02-03 DIAGNOSIS — H43813 Vitreous degeneration, bilateral: Secondary | ICD-10-CM | POA: Diagnosis not present

## 2018-02-11 ENCOUNTER — Other Ambulatory Visit: Payer: Self-pay | Admitting: Internal Medicine

## 2018-02-11 ENCOUNTER — Ambulatory Visit (INDEPENDENT_AMBULATORY_CARE_PROVIDER_SITE_OTHER): Payer: PPO | Admitting: General Practice

## 2018-02-11 DIAGNOSIS — Z7901 Long term (current) use of anticoagulants: Secondary | ICD-10-CM

## 2018-02-11 DIAGNOSIS — I4891 Unspecified atrial fibrillation: Secondary | ICD-10-CM

## 2018-02-11 LAB — POCT INR: INR: 2.9 (ref 2.0–3.0)

## 2018-02-11 NOTE — Patient Instructions (Addendum)
Pre visit review using our clinic review tool, if applicable. No additional management support is needed unless otherwise documented below in the visit note.  Continue to take 2 (2/5 mg) tablets on Sun/Tues/Thurs.  and take 1 (2.5 mg tablet) on Monday and Fridays and Saturdays and take 1 mg on Wednesdays.  Re-check in 6 weeks.

## 2018-03-10 ENCOUNTER — Other Ambulatory Visit: Payer: Self-pay | Admitting: Family Medicine

## 2018-03-10 NOTE — Telephone Encounter (Signed)
Rx is not on current med list. Looks like the patient has had this once before on 04/05/14.  Please advise

## 2018-03-27 DIAGNOSIS — H34233 Retinal artery branch occlusion, bilateral: Secondary | ICD-10-CM | POA: Diagnosis not present

## 2018-03-27 DIAGNOSIS — H34833 Tributary (branch) retinal vein occlusion, bilateral, with macular edema: Secondary | ICD-10-CM | POA: Diagnosis not present

## 2018-03-27 DIAGNOSIS — H3582 Retinal ischemia: Secondary | ICD-10-CM | POA: Diagnosis not present

## 2018-03-27 DIAGNOSIS — H43813 Vitreous degeneration, bilateral: Secondary | ICD-10-CM | POA: Diagnosis not present

## 2018-04-01 ENCOUNTER — Ambulatory Visit (INDEPENDENT_AMBULATORY_CARE_PROVIDER_SITE_OTHER): Payer: PPO | Admitting: General Practice

## 2018-04-01 DIAGNOSIS — Z7901 Long term (current) use of anticoagulants: Secondary | ICD-10-CM | POA: Diagnosis not present

## 2018-04-01 DIAGNOSIS — I4891 Unspecified atrial fibrillation: Secondary | ICD-10-CM

## 2018-04-01 LAB — POCT INR: INR: 2.9 (ref 2.0–3.0)

## 2018-04-01 NOTE — Patient Instructions (Signed)
Pre visit review using our clinic review tool, if applicable. No additional management support is needed unless otherwise documented below in the visit note.  Continue to [facility] 1 (5 mg) tablet on Sun/Tues/Thurs.  and take 1 (2.5 mg tablet) on Monday and Fridays and Saturdays and take 1 mg on Wednesdays.  Re-check in 6 weeks.

## 2018-05-13 ENCOUNTER — Ambulatory Visit (INDEPENDENT_AMBULATORY_CARE_PROVIDER_SITE_OTHER): Payer: PPO | Admitting: General Practice

## 2018-05-13 DIAGNOSIS — Z7901 Long term (current) use of anticoagulants: Secondary | ICD-10-CM | POA: Diagnosis not present

## 2018-05-13 DIAGNOSIS — Z23 Encounter for immunization: Secondary | ICD-10-CM

## 2018-05-13 DIAGNOSIS — I4891 Unspecified atrial fibrillation: Secondary | ICD-10-CM

## 2018-05-13 LAB — POCT INR: INR: 3 (ref 2.0–3.0)

## 2018-05-13 NOTE — Progress Notes (Signed)
Flu

## 2018-05-13 NOTE — Patient Instructions (Addendum)
Pre visit review using our clinic review tool, if applicable. No additional management support is needed unless otherwise documented below in the visit note.  Continue to take 1 (5 mg) tablet on Sun/Tues/Thurs.and take 1 (2.5 mg tablet) on Monday and Fridays and Saturdays and take 1 mg on Wednesdays.  Re-check in 6 weeks.  

## 2018-05-22 DIAGNOSIS — H43813 Vitreous degeneration, bilateral: Secondary | ICD-10-CM | POA: Diagnosis not present

## 2018-05-22 DIAGNOSIS — H34833 Tributary (branch) retinal vein occlusion, bilateral, with macular edema: Secondary | ICD-10-CM | POA: Diagnosis not present

## 2018-05-22 DIAGNOSIS — H34233 Retinal artery branch occlusion, bilateral: Secondary | ICD-10-CM | POA: Diagnosis not present

## 2018-05-22 DIAGNOSIS — H3582 Retinal ischemia: Secondary | ICD-10-CM | POA: Diagnosis not present

## 2018-05-25 ENCOUNTER — Ambulatory Visit: Payer: PPO | Admitting: Podiatry

## 2018-05-25 DIAGNOSIS — M79676 Pain in unspecified toe(s): Secondary | ICD-10-CM | POA: Diagnosis not present

## 2018-05-25 DIAGNOSIS — B351 Tinea unguium: Secondary | ICD-10-CM | POA: Diagnosis not present

## 2018-05-28 ENCOUNTER — Ambulatory Visit: Payer: Self-pay

## 2018-05-28 NOTE — Telephone Encounter (Signed)
Incoming call from patient with a complaint of feeling weak.  Patient states he" feels weak , legs feel wobbly and jittery."  Inquired if he was a diabetic, he stated no.  Inquired if he was eating?  Patient states"  I eat every thing I can".  States he tries to drink a glass of water with each meal.  States, " I drink when Im thirsty and stop when I not". Rates the weakness as severe.  States this has been going on for 3 to 4 days.  Thinks the cause of this is because " Im 84 years old". Has not  Started any new medication. Denies any other symptoms.  No diarrhea, vomiting,  No area of pain.  Provided care advice, voiced understanding along with his wife.  Patient scheduled for 05/29/18 with Dr. Alysia Penna at 1:30pm Patient and wife voiced understanding.      Reason for Disposition . [1] MODERATE weakness (i.e., interferes with work, school, normal activities) AND [2] persists > 3 days  Answer Assessment - Initial Assessment Questions 1. DESCRIPTION: "Describe how you are feeling."     **legs are wobbly feel weak 2. SEVERITY: "How bad is it?"  "Can you stand and walk?"   - MILD - Feels weak or tired, but does not interfere with work, school or normal activities   - [place] to stand and walk; weakness interferes with work, school, or normal activities   - SEVERE - Unable to stand or walk     severe 3. ONSET:  "When did the weakness begin?"     3 to 4 days 4. CAUSE: "What do you think is causing the weakness?"     84 years old 5. MEDICINES: "Have you recently started a new medicine or had a change in the amount of a medicine?"     no 6. OTHER SYMPTOMS: "Do you have any other symptoms?" (e.g., chest pain, fever, cough, SOB, vomiting, diarrhea, bleeding, other areas of pain)     no 7. PREGNANCY: "Is there any chance you are pregnant?" "When was your last menstrual period?"     na  Protocols used: WEAKNESS (GENERALIZED) AND FATIGUE-A-AH

## 2018-05-28 NOTE — Telephone Encounter (Signed)
noted 

## 2018-05-29 ENCOUNTER — Ambulatory Visit (INDEPENDENT_AMBULATORY_CARE_PROVIDER_SITE_OTHER): Payer: PPO | Admitting: Family Medicine

## 2018-05-29 ENCOUNTER — Encounter: Payer: Self-pay | Admitting: Family Medicine

## 2018-05-29 VITALS — BP 124/88 | HR 106 | Temp 97.8°F | Wt 208.0 lb

## 2018-05-29 DIAGNOSIS — R531 Weakness: Secondary | ICD-10-CM

## 2018-05-29 LAB — BASIC METABOLIC PANEL
BUN: 24 mg/dL — ABNORMAL HIGH (ref 6–23)
CHLORIDE: 104 meq/L (ref 96–112)
CO2: 27 mEq/L (ref 19–32)
Calcium: 9.2 mg/dL (ref 8.4–10.5)
Creatinine, Ser: 1.77 mg/dL — ABNORMAL HIGH (ref 0.40–1.50)
GFR: 38.82 mL/min — AB (ref >60.00)
Glucose, Bld: 124 mg/dL — ABNORMAL HIGH (ref 70–99)
Potassium: 4.3 mEq/L (ref 3.5–5.1)
SODIUM: 138 meq/L (ref 135–145)

## 2018-05-29 LAB — CBC WITH DIFFERENTIAL/PLATELET
BASOS ABS: 0 10*3/uL (ref 0.0–0.1)
Basophils Relative: 0.8 % (ref 0.0–3.0)
Eosinophils Absolute: 0 10*3/uL (ref 0.0–0.7)
Eosinophils Relative: 0.6 % (ref 0.0–5.0)
HCT: 47.4 % (ref 39.0–52.0)
Hemoglobin: 16.4 g/dL (ref 13.0–17.0)
LYMPHS ABS: 2.1 10*3/uL (ref 0.7–4.0)
Lymphocytes Relative: 34.8 % (ref 12.0–46.0)
MCHC: 34.5 g/dL (ref 30.0–36.0)
MCV: 93 fl (ref 78.0–100.0)
MONOS PCT: 12.6 % — AB (ref 3.0–12.0)
Monocytes Absolute: 0.8 10*3/uL (ref 0.1–1.0)
NEUTROS PCT: 51.2 % (ref 43.0–77.0)
Neutro Abs: 3.1 10*3/uL (ref 1.4–7.7)
Platelets: 160 10*3/uL (ref 150.0–400.0)
RBC: 5.1 Mil/uL (ref 4.22–5.81)
RDW: 14.2 % (ref 11.5–15.5)
WBC: 6.1 10*3/uL (ref 4.0–10.5)

## 2018-05-29 LAB — HEPATIC FUNCTION PANEL
ALBUMIN: 3.9 g/dL (ref 3.5–5.2)
ALT: 18 U/L (ref 0–53)
AST: 21 U/L (ref 0–37)
Alkaline Phosphatase: 65 U/L (ref 39–117)
Bilirubin, Direct: 0.2 mg/dL (ref 0.0–0.3)
TOTAL PROTEIN: 6.2 g/dL (ref 6.0–8.3)
Total Bilirubin: 0.7 mg/dL (ref 0.2–1.2)

## 2018-05-29 LAB — TSH: TSH: 3.22 u[IU]/mL (ref 0.35–4.50)

## 2018-05-29 LAB — T4, FREE: FREE T4: 0.83 ng/dL (ref 0.60–1.60)

## 2018-05-29 LAB — VITAMIN B12: VITAMIN B 12: 122 pg/mL — AB (ref 211–911)

## 2018-05-29 LAB — T3, FREE: T3, Free: 3.1 pg/mL (ref 2.3–4.2)

## 2018-05-29 LAB — VITAMIN D 25 HYDROXY (VIT D DEFICIENCY, FRACTURES): VITD: 35.24 ng/mL (ref 30.00–100.00)

## 2018-05-29 MED ORDER — CLOTRIMAZOLE-BETAMETHASONE 1-0.05 % EX CREA: 1.0000 "application " | TOPICAL_CREAM | Freq: Two times a day (BID) | CUTANEOUS | 2 refills | Status: DC

## 2018-05-29 NOTE — Progress Notes (Signed)
   Subjective: 84 year old male presenting today for follow up evaluation of dystrophic bilateral great toenails. He states he had the nails trimmed at his last visit which alleviated his symptoms of thickening and tenderness. He states they have grown back and are now problematic again. He has not done anything for treatment at home. Touching the nails increases the pain. Patient is here for further evaluation and treatment.   Past Medical History:  Diagnosis Date  . Arthritis   . Atrial fibrillation [facility])    sees [name]   . Cancer [facility])    ureteral, per Dr. Alinda Money   . Eye disease    sees [name]   . Hemorrhoids   . Hyperlipidemia   . Hypertension   . Low back pain   . TIA (transient ischemic attack)     Objective: Physical Exam General: The patient is alert and oriented x3 in no acute distress.  Dermatology: Hyperkeratotic, discolored, thickened, onychodystrophy of great toenails noted bilaterally.  Skin is warm, dry and supple bilateral lower extremities. Negative for open lesions or macerations.  Vascular: Palpable pedal pulses bilaterally. No edema or erythema noted. Capillary refill within normal limits.  Neurological: Epicritic and protective threshold grossly intact bilaterally.   Musculoskeletal Exam: Range of motion within normal limits to all pedal and ankle joints bilateral. Muscle strength 5/5 in all groups bilateral.   Assessment: #1 dystrophic bilateral great toenails   Plan of Care:  #1 Patient was evaluated. #2 Mechanical debridement of great toenails bilaterally performed using a nail nipper. Filed with dremel without incident.  #3 Recommended good shoe gear.  #4 Continue getting monthly pedicures.  #5 Return to clinic as needed.   Edrick Kins, DPM [facility]  Dr. Edrick Kins, [address]                                        [place], [place] [address]                Office [phone]  Fax  [phone]

## 2018-05-29 NOTE — Progress Notes (Signed)
   Subjective:    Patient ID: Justin Gallegos, male    DOB: 05-30-1929, 84 y.o.   MRN: [id number]  HPI Here with his wife for one week of feeling weak in the legs. They do not give out on him but he feels unsteady on his feet. He uses a cane. No other symptoms. No medication changes recently.    Review of Systems  Constitutional: Negative.   Respiratory: Negative.   Cardiovascular: Negative.   Gastrointestinal: Negative.   Neurological: Positive for weakness. Negative for tremors and numbness.       Objective:   Physical Exam  Constitutional: He is oriented to person, place, and time. He appears well-developed and well-nourished. No distress.  Cardiovascular: Normal rate, regular rhythm, normal heart sounds and intact distal pulses.  Pulmonary/Chest: Effort normal and breath sounds normal.  Neurological: He is alert and oriented to person, place, and time. No cranial nerve deficit. He exhibits normal muscle tone. Coordination normal.          Assessment & Plan:  Leg weakness. We will check labs for anemia, etc.  Alysia Penna, MD

## 2018-06-04 ENCOUNTER — Encounter: Payer: Self-pay | Admitting: Adult Health

## 2018-06-04 ENCOUNTER — Ambulatory Visit (INDEPENDENT_AMBULATORY_CARE_PROVIDER_SITE_OTHER): Payer: PPO | Admitting: Adult Health

## 2018-06-04 VITALS — BP 118/80 | Temp 98.1°F | Wt 212.0 lb

## 2018-06-04 DIAGNOSIS — R05 Cough: Secondary | ICD-10-CM

## 2018-06-04 DIAGNOSIS — R066 Hiccough: Secondary | ICD-10-CM

## 2018-06-04 DIAGNOSIS — R059 Cough, unspecified: Secondary | ICD-10-CM

## 2018-06-04 MED ORDER — IPRATROPIUM-ALBUTEROL 0.5-2.5 (3) MG/3ML IN SOLN
3.0000 mL | Freq: Once | RESPIRATORY_TRACT | Status: AC
  Administered 2018-06-04: 3 mL via RESPIRATORY_TRACT

## 2018-06-04 MED ORDER — BACLOFEN 5 MG PO TABS: 5.0000 mg | ORAL_TABLET | Freq: Three times a day (TID) | ORAL | 0 refills | Status: AC | PRN

## 2018-06-04 MED ORDER — BENZONATATE 100 MG PO CAPS: 100.0000 mg | ORAL_CAPSULE | Freq: Two times a day (BID) | ORAL | 0 refills | Status: DC | PRN

## 2018-06-04 MED ORDER — HYDROCODONE-HOMATROPINE 5-1.5 MG/5ML PO SYRP: 5.0000 mL | ORAL_SOLUTION | Freq: Three times a day (TID) | ORAL | 0 refills | Status: DC | PRN

## 2018-06-04 NOTE — Progress Notes (Signed)
Subjective:    Patient ID: Justin Gallegos, male    DOB: 07-08-1929, 84 y.o.   MRN: [id number]  Cough  This is a new problem. The current episode started yesterday. The problem has been gradually worsening. The cough is non-productive. Associated symptoms include a fever (subjective ) and wheezing. Pertinent negatives include no chills, ear congestion, ear pain, headaches, nasal congestion, postnasal drip, rhinorrhea or shortness of breath. Associated symptoms comments: Hiccups  . The symptoms are aggravated by lying down. He has tried nothing for the symptoms. There is no history of asthma, bronchitis, emphysema or pneumonia.      Review of Systems  Constitutional: Positive for fatigue and fever (subjective ). Negative for appetite change, chills and diaphoresis.  HENT: Negative for ear pain, postnasal drip, rhinorrhea, sinus pressure, sinus pain, trouble swallowing and voice change.   Respiratory: Positive for cough and wheezing. Negative for chest tightness and shortness of breath.   Cardiovascular: Negative.   Neurological: Negative for headaches.   Past Medical History:  Diagnosis Date  . Arthritis   . Atrial fibrillation [facility])    sees [name]   . Cancer [facility])    ureteral, per Dr. Alinda Money   . Eye disease    sees [name]   . Hemorrhoids   . Hyperlipidemia   . Hypertension   . Low back pain   . TIA (transient ischemic attack)     Social History   Socioeconomic History  . Marital status: Married    Spouse name: Not on file  . Number of children: Not on file  . Years of education: Not on file  . Highest education level: Not on file  Occupational History  . Not on file  Social Needs  . Financial resource strain: Not on file  . Food insecurity:    Worry: Not on file    Inability: Not on file  . Transportation needs:    Medical: Not on file    Non-medical: Not on file  Tobacco Use  . Smoking status: Never Smoker  . Smokeless tobacco: Never Used    Substance and Sexual Activity  . Alcohol use: No    Alcohol/week: 0.0 standard drinks  . Drug use: No  . Sexual activity: Not on file  Lifestyle  . Physical activity:    Days per week: Not on file    Minutes per session: Not on file  . Stress: Not on file  Relationships  . Social connections:    Talks on phone: Not on file    Gets together: Not on file    Attends religious service: Not on file    Active member of club or organization: Not on file    Attends meetings of clubs or organizations: Not on file    Relationship status: Not on file  . Intimate partner violence:    Fear of current or ex partner: Not on file    Emotionally abused: Not on file    Physically abused: Not on file    Forced sexual activity: Not on file  Other Topics Concern  . Not on file  Social History Narrative  . Not on file    Past Surgical History:  Procedure Laterality Date  . APPENDECTOMY    . CATARACT EXTRACTION, BILATERAL  2013   per [name]   . COLONOSCOPY  06-28-10   per [name], benign polyps and severe diverticulosis, no repeats needed   . HERNIA REPAIR    .  NEPHRECTOMY    . TONSILLECTOMY    . URETERECTOMY      Family History  Problem Relation Age of Onset  . Stroke Father   . Coronary artery disease Unknown        fhx  . Stroke Unknown        fhx    Allergies  Allergen Reactions  . Penicillins     Unable to breathe   . Amoxicillin-Pot Clavulanate     REACTION: throat swelling    Current Outpatient Medications on File Prior to Visit  Medication Sig Dispense Refill  . Aflibercept (EYLEA IO) Inject into the eye.    [name] atorvastatin (LIPITOR) 40 MG tablet TAKE 1 TABLET BY MOUTH ONCE DAILY 90 tablet 2  . clotrimazole-betamethasone (LOTRISONE) cream Apply 1 application topically 2 (two) times daily. 45 g 2  . ezetimibe (ZETIA) 10 MG tablet TAKE 1 TABLET BY MOUTH ONCE DAILY 90 tablet 2  . fenofibrate 160 MG tablet Take 1 tablet (160 mg total) by mouth daily. 90 tablet 3   . fluticasone (FLONASE) 50 MCG/ACT nasal spray USE TWO SPRAYS IN EACH NOSTRIL AS NEEDED (Patient taking differently: USE TWO SPRAYS IN EACH NOSTRIL AS NEEDED over the counter) 16 g 0  . ketoconazole (NIZORAL) 2 % cream APPLY  CREAM TOPICALLY TO AFFECTED AREA TWICE DAILY 30 g 5  . metoprolol succinate (TOPROL-XL) 25 MG 24 hr tablet Take 1 tablet (25 mg total) by mouth daily. 90 tablet 3  . warfarin (COUMADIN) 1 MG tablet TAKE AS DIRECTED BY  ANTICOAGULATION  CLINIC.  USE  IN  ADDITION  TO  5  MG  TABLETS 30 tablet 11  . warfarin (COUMADIN) 2.5 MG tablet Take 2 tablets on Sun Tues and Thurs and take 1 tablet on Mon Fri and Sat. Or AS DIRECTED BY ANTICOAGULATION CLINIC. 120 tablet 0  . warfarin (COUMADIN) 5 MG tablet Take as directed by coumadin clinic     No current facility-administered medications on file prior to visit.     BP 118/80   Temp 98.1 F (36.7 C)   Wt 212 lb (96.2 kg)   BMI 29.57 kg/m        Objective:   Physical Exam  Constitutional: He is oriented to person, place, and time. He appears well-developed and well-nourished. No distress.  Cardiovascular: Normal rate, regular rhythm, normal heart sounds and intact distal pulses.  Pulmonary/Chest: Effort normal. He has wheezes.  Musculoskeletal: Normal range of motion.  Hiccups    Neurological: He is alert and oriented to person, place, and time.  Skin: Skin is warm and dry. He is not diaphoretic.  Psychiatric: He has a normal mood and affect. His behavior is normal. Judgment and thought content normal.  Nursing note and vitals reviewed.     Assessment & Plan:  1. Cough - Wheezing had resolved after duo neb. Patient continued to have cough  - ipratropium-albuterol (DUONEB) 0.5-2.5 (3) MG/3ML nebulizer solution 3 mL - benzonatate (TESSALON) 100 MG capsule; Take 1 capsule (100 mg total) by mouth 2 (two) times daily as needed for cough.  Dispense: 20 capsule; Refill: 0 - HYDROcodone-homatropine (HYCODAN) 5-1.5 MG/5ML  syrup; Take 5 mLs by mouth every 8 (eight) hours as needed for cough.  Dispense: 120 mL; Refill: 0  2. Intractable hiccups - Baclofen 5 MG TABS; Take 5 mg by mouth 3 (three) times daily as needed for up to 3 days.  Dispense: 15 tablet; Refill: 0 - Advised not to take Baclofen  with Hycodan cough syrup   Dorothyann Peng, NP

## 2018-06-09 ENCOUNTER — Ambulatory Visit (INDEPENDENT_AMBULATORY_CARE_PROVIDER_SITE_OTHER): Payer: PPO | Admitting: *Deleted

## 2018-06-09 DIAGNOSIS — E538 Deficiency of other specified B group vitamins: Secondary | ICD-10-CM

## 2018-06-09 MED ORDER — CYANOCOBALAMIN 1000 MCG/ML IJ SOLN
1000.0000 ug | Freq: Once | INTRAMUSCULAR | Status: AC
  Administered 2018-06-09: 1000 ug via INTRAMUSCULAR

## 2018-06-09 NOTE — Progress Notes (Signed)
Per orders of Dr. Panosh, injection of B12 given by Ryian Lynde. Patient tolerated injection well. 

## 2018-06-16 ENCOUNTER — Ambulatory Visit (INDEPENDENT_AMBULATORY_CARE_PROVIDER_SITE_OTHER): Payer: PPO

## 2018-06-16 DIAGNOSIS — E538 Deficiency of other specified B group vitamins: Secondary | ICD-10-CM

## 2018-06-16 MED ORDER — CYANOCOBALAMIN 1000 MCG/ML IJ SOLN
1000.0000 ug | Freq: Once | INTRAMUSCULAR | Status: AC
  Administered 2018-06-16: 1000 ug via INTRAMUSCULAR

## 2018-06-16 NOTE — Progress Notes (Signed)
Per orders of [name], injection of B12 given by Rebecca Eaton. Patient tolerated injection well.  Injection 2/12

## 2018-06-24 ENCOUNTER — Ambulatory Visit (INDEPENDENT_AMBULATORY_CARE_PROVIDER_SITE_OTHER): Payer: PPO | Admitting: General Practice

## 2018-06-24 ENCOUNTER — Ambulatory Visit (INDEPENDENT_AMBULATORY_CARE_PROVIDER_SITE_OTHER): Payer: PPO | Admitting: *Deleted

## 2018-06-24 DIAGNOSIS — Z7901 Long term (current) use of anticoagulants: Secondary | ICD-10-CM

## 2018-06-24 DIAGNOSIS — E538 Deficiency of other specified B group vitamins: Secondary | ICD-10-CM

## 2018-06-24 DIAGNOSIS — I4891 Unspecified atrial fibrillation: Secondary | ICD-10-CM

## 2018-06-24 LAB — POCT INR: INR: 2.4 (ref 2.0–3.0)

## 2018-06-24 MED ORDER — CYANOCOBALAMIN 1000 MCG/ML IJ SOLN
1000.0000 ug | Freq: Once | INTRAMUSCULAR | Status: AC
  Administered 2018-06-24: 1000 ug via INTRAMUSCULAR

## 2018-06-24 NOTE — Patient Instructions (Signed)
Pre visit review using our clinic review tool, if applicable. No additional management support is needed unless otherwise documented below in the visit note.  Take 2 mg's today (11/13) and then continue to take 1 (5 mg) tablet on Sun/Tues/Thurs.and take 1 (2.5 mg tablet) on Monday and Fridays and Saturdays and take 1 mg on Wednesdays.  Re-check in 6 weeks.

## 2018-06-24 NOTE — Progress Notes (Signed)
Per orders of Dr. Fry, injection of Cyanocobalamin 1000mcg given by Jovon Streetman A. Patient tolerated injection well.  

## 2018-06-30 ENCOUNTER — Ambulatory Visit (INDEPENDENT_AMBULATORY_CARE_PROVIDER_SITE_OTHER): Payer: PPO

## 2018-06-30 DIAGNOSIS — E538 Deficiency of other specified B group vitamins: Secondary | ICD-10-CM

## 2018-06-30 MED ORDER — CYANOCOBALAMIN 1000 MCG/ML IJ SOLN
1000.0000 ug | Freq: Once | INTRAMUSCULAR | Status: AC
  Administered 2018-06-30: 1000 ug via INTRAMUSCULAR

## 2018-06-30 NOTE — Progress Notes (Signed)
Per orders of Dr. Fry, injection of B12 given by Kasai Beltran. Patient tolerated injection well.  

## 2018-07-06 ENCOUNTER — Ambulatory Visit (INDEPENDENT_AMBULATORY_CARE_PROVIDER_SITE_OTHER): Payer: PPO | Admitting: Family Medicine

## 2018-07-06 ENCOUNTER — Ambulatory Visit: Payer: Self-pay | Admitting: *Deleted

## 2018-07-06 ENCOUNTER — Encounter: Payer: Self-pay | Admitting: Family Medicine

## 2018-07-06 VITALS — BP 110/74 | HR 90 | Temp 98.2°F | Wt 204.5 lb

## 2018-07-06 DIAGNOSIS — E538 Deficiency of other specified B group vitamins: Secondary | ICD-10-CM | POA: Diagnosis not present

## 2018-07-06 DIAGNOSIS — I639 Cerebral infarction, unspecified: Secondary | ICD-10-CM | POA: Diagnosis not present

## 2018-07-06 MED ORDER — CYANOCOBALAMIN 1000 MCG/ML IJ SOLN
1000.0000 ug | Freq: Once | INTRAMUSCULAR | Status: AC
  Administered 2018-07-06: 1000 ug via INTRAMUSCULAR

## 2018-07-06 NOTE — Telephone Encounter (Signed)
He was seen OV today  

## 2018-07-06 NOTE — Telephone Encounter (Signed)
Justin Gallegos reports Justin Gallegos noticed he had right-sided weakness Friday evening but did not want to go to the hospital. They were assuming the weakness would improve since he had had a TIA in the past and symptoms resolved. She reports she has notice  occasional tongue tied words since the weakness began 3 days ago. Denies all other symptoms. Appointment made for this afternoon with [name]. Discussed if symptoms worsen or any additional symptoms today to call 911 immediately.Stated she understood.  Reason for Disposition . [1] Weakness of the face, arm / hand, or leg / foot on one side of the body AND [2] gradual onset (e.g., days to weeks) AND [3] present now  Answer Assessment - Initial Assessment Questions 1. SYMPTOM: "What is the main symptom you are concerned about?" (e.g., weakness, numbness)     Right sided weakness since Friday. 2. ONSET: "When did this start?" (minutes, hours, days; while sleeping)     3 days ago, Friday evening. 3. LAST NORMAL: "When was the last time you were normal (no symptoms)?"     Friday 4. PATTERN "Does this come and go, or has it been constant since it started?"  "Is it present now?"     Remained the same 5. CARDIAC SYMPTOMS: "Have you had any of the following symptoms: chest pain, difficulty breathing, palpitations?"     no 6. NEUROLOGIC SYMPTOMS: "Have you had any of the following symptoms: headache, dizziness, vision loss, double vision, changes in speech, unsteady on your feet?"     Occasional tongue tied since this started.  7. OTHER SYMPTOMS: "Do you have any other symptoms?"     no 8. PREGNANCY: "Is there any chance you are pregnant?" "When was your last menstrual period?"     na  Protocols used: NEUROLOGIC DEFICIT-A-AH

## 2018-07-06 NOTE — Telephone Encounter (Signed)
FYI--pt has appt with [name] at 4 pm today---[name] please advise if pt needs to keep this appt or go to the ER?  This has been going on for 3 days with the right sided weakness.  Thanks

## 2018-07-07 ENCOUNTER — Encounter: Payer: Self-pay | Admitting: Family Medicine

## 2018-07-07 NOTE — Progress Notes (Signed)
   Subjective:    Patient ID: Justin Gallegos, male    DOB: [birth year], 84 y.o.   MRN: [id number]  HPI Here with his wife and daughter to discuss some transient weakness in several areas. About 3 days ago he had the sudden onset of weakness in the right arm and hand, also in right leg. No pain or swelling, no numbness. No vision or speech changes. No headache. In the past few days most of the strength has come back in the right hand but the leg is not normal . Of note he had a TIA last year and he saw Dr. Tomi Likens in Neurology for this on 02-11-17. An MRI of the brain showed a small new infarct in the inferior right cerebellum.    Review of Systems  Constitutional: Negative.   Respiratory: Negative.   Cardiovascular: Negative.   Neurological: Positive for weakness. Negative for dizziness, tremors, seizures, syncope, facial asymmetry, speech difficulty, light-headedness, numbness and headaches.       Objective:   Physical Exam  Constitutional: He is oriented to person, place, and time. He appears well-developed and well-nourished. No distress.  Walks with a cane   Cardiovascular: Normal rate, regular rhythm, normal heart sounds and intact distal pulses.  Pulmonary/Chest: Effort normal and breath sounds normal.  Neurological: He is alert and oriented to person, place, and time. He displays normal reflexes. No cranial nerve deficit or sensory deficit. Coordination normal.  Strength is normal in the right hand and right arm. There is slight weakness in the right leg, especially in hip flexion.           Assessment & Plan:  He has likely had another mild stroke. The family does not wish to pursue another expensive MRI, so we agreed to watch him carefully at home. I did suggest he see Dr. Tomi Likens again soon, and they will call to set this up. He will stay on Coumadin as before, and we will continue to reduce his risk as much as we can.  Alysia Penna, MD

## 2018-07-08 ENCOUNTER — Ambulatory Visit: Payer: PPO

## 2018-07-14 ENCOUNTER — Ambulatory Visit (INDEPENDENT_AMBULATORY_CARE_PROVIDER_SITE_OTHER): Payer: PPO

## 2018-07-14 DIAGNOSIS — E538 Deficiency of other specified B group vitamins: Secondary | ICD-10-CM

## 2018-07-14 MED ORDER — CYANOCOBALAMIN 1000 MCG/ML IJ SOLN
1000.0000 ug | Freq: Once | INTRAMUSCULAR | Status: AC
  Administered 2018-07-14: 1000 ug via INTRAMUSCULAR

## 2018-07-14 NOTE — Progress Notes (Signed)
Per orders of Dr. Alysia Penna, injection of Cyanocobalamin 1000 mcg given by Wyvonne Lenz. Patient tolerated injection well.

## 2018-07-21 ENCOUNTER — Ambulatory Visit (INDEPENDENT_AMBULATORY_CARE_PROVIDER_SITE_OTHER): Payer: PPO | Admitting: *Deleted

## 2018-07-21 DIAGNOSIS — E538 Deficiency of other specified B group vitamins: Secondary | ICD-10-CM

## 2018-07-21 MED ORDER — CYANOCOBALAMIN 1000 MCG/ML IJ SOLN
1000.0000 ug | Freq: Once | INTRAMUSCULAR | Status: AC
  Administered 2018-07-21: 1000 ug via INTRAMUSCULAR

## 2018-07-24 DIAGNOSIS — H3582 Retinal ischemia: Secondary | ICD-10-CM | POA: Diagnosis not present

## 2018-07-24 DIAGNOSIS — H43813 Vitreous degeneration, bilateral: Secondary | ICD-10-CM | POA: Diagnosis not present

## 2018-07-24 DIAGNOSIS — H34833 Tributary (branch) retinal vein occlusion, bilateral, with macular edema: Secondary | ICD-10-CM | POA: Diagnosis not present

## 2018-07-24 DIAGNOSIS — H34233 Retinal artery branch occlusion, bilateral: Secondary | ICD-10-CM | POA: Diagnosis not present

## 2018-07-29 ENCOUNTER — Ambulatory Visit (INDEPENDENT_AMBULATORY_CARE_PROVIDER_SITE_OTHER): Payer: PPO | Admitting: General Practice

## 2018-07-29 DIAGNOSIS — E538 Deficiency of other specified B group vitamins: Secondary | ICD-10-CM

## 2018-07-29 DIAGNOSIS — I4891 Unspecified atrial fibrillation: Secondary | ICD-10-CM

## 2018-07-29 DIAGNOSIS — Z7901 Long term (current) use of anticoagulants: Secondary | ICD-10-CM

## 2018-07-29 LAB — POCT INR: INR: 3.9 — AB (ref 2.0–3.0)

## 2018-07-29 MED ORDER — CYANOCOBALAMIN 1000 MCG/ML IJ SOLN
1000.0000 ug | Freq: Once | INTRAMUSCULAR | Status: DC
  Administered 2018-07-29: 1000 ug via INTRAMUSCULAR

## 2018-07-29 NOTE — Patient Instructions (Signed)
Pre visit review using our clinic review tool, if applicable. No additional management support is needed unless otherwise documented below in the visit note.  Hold coumadin today and tomorrow and then continue to take 1 (5 mg) tablet on Sun/Tues/Thurs.and take 1 (2.5 mg tablet) on Monday and Fridays and Saturdays and take 1 mg on Wednesdays.  Re-check in 2 weeks.

## 2018-08-04 ENCOUNTER — Ambulatory Visit (INDEPENDENT_AMBULATORY_CARE_PROVIDER_SITE_OTHER): Payer: PPO | Admitting: *Deleted

## 2018-08-04 ENCOUNTER — Ambulatory Visit: Payer: PPO

## 2018-08-04 DIAGNOSIS — E538 Deficiency of other specified B group vitamins: Secondary | ICD-10-CM

## 2018-08-04 MED ORDER — CYANOCOBALAMIN 1000 MCG/ML IJ SOLN
1000.0000 ug | Freq: Once | INTRAMUSCULAR | Status: AC
  Administered 2018-08-04: 1000 ug via INTRAMUSCULAR

## 2018-08-04 NOTE — Progress Notes (Signed)
Pt was given B12 injection in the left delt and tolerated well.  This was from orders of [name].

## 2018-08-06 ENCOUNTER — Other Ambulatory Visit: Payer: Self-pay | Admitting: Family Medicine

## 2018-08-11 ENCOUNTER — Ambulatory Visit: Payer: PPO

## 2018-08-11 ENCOUNTER — Ambulatory Visit (INDEPENDENT_AMBULATORY_CARE_PROVIDER_SITE_OTHER): Payer: PPO | Admitting: *Deleted

## 2018-08-11 DIAGNOSIS — E538 Deficiency of other specified B group vitamins: Secondary | ICD-10-CM

## 2018-08-11 MED ORDER — CYANOCOBALAMIN 1000 MCG/ML IJ SOLN
1000.0000 ug | Freq: Once | INTRAMUSCULAR | Status: AC
  Administered 2018-08-11: 1000 ug via INTRAMUSCULAR

## 2018-08-11 NOTE — Progress Notes (Signed)
Per orders of Dr. Fry, injection of B12 given by Manvir Thorson. Patient tolerated injection well. 

## 2018-08-17 ENCOUNTER — Ambulatory Visit (INDEPENDENT_AMBULATORY_CARE_PROVIDER_SITE_OTHER): Payer: PPO | Admitting: General Practice

## 2018-08-17 ENCOUNTER — Ambulatory Visit: Payer: PPO

## 2018-08-17 DIAGNOSIS — Z7901 Long term (current) use of anticoagulants: Secondary | ICD-10-CM

## 2018-08-17 DIAGNOSIS — I4891 Unspecified atrial fibrillation: Secondary | ICD-10-CM

## 2018-08-17 DIAGNOSIS — E538 Deficiency of other specified B group vitamins: Secondary | ICD-10-CM | POA: Diagnosis not present

## 2018-08-17 LAB — POCT INR
INR: 2.5 (ref 2.0–3.0)
INR: 3.5 — AB (ref 2.0–3.0)

## 2018-08-17 MED ORDER — CYANOCOBALAMIN 1000 MCG/ML IJ SOLN
1000.0000 ug | Freq: Once | INTRAMUSCULAR | Status: AC
  Administered 2018-08-17: 1000 ug via INTRAMUSCULAR

## 2018-08-17 NOTE — Patient Instructions (Addendum)
Pre visit review using our clinic review tool, if applicable. No additional management support is needed unless otherwise documented below in the visit note.  Continue to take 1 (5 mg) tablet on Sun/Tues/Thurs.and take 1 (2.5 mg tablet) on Monday and Fridays and Saturdays and take 1 mg on Wednesdays.  Re-check in 4 weeks.

## 2018-08-19 DIAGNOSIS — Z8551 Personal history of malignant neoplasm of bladder: Secondary | ICD-10-CM | POA: Diagnosis not present

## 2018-08-24 ENCOUNTER — Ambulatory Visit (INDEPENDENT_AMBULATORY_CARE_PROVIDER_SITE_OTHER): Payer: PPO | Admitting: *Deleted

## 2018-08-24 DIAGNOSIS — E538 Deficiency of other specified B group vitamins: Secondary | ICD-10-CM

## 2018-08-24 MED ORDER — CYANOCOBALAMIN 1000 MCG/ML IJ SOLN
1000.0000 ug | Freq: Once | INTRAMUSCULAR | Status: AC
  Administered 2018-08-24: 1000 ug via INTRAMUSCULAR

## 2018-08-24 NOTE — Progress Notes (Signed)
Per orders of Dr. Fry, injection of Vit B12 given by Edgar Corrigan M. Patient tolerated injection well.  

## 2018-08-25 ENCOUNTER — Telehealth: Payer: Self-pay | Admitting: Family Medicine

## 2018-08-25 NOTE — Telephone Encounter (Signed)
Rec'd from [facility] forwarded 4 pages to [name] A. Annie Main.

## 2018-09-01 ENCOUNTER — Ambulatory Visit (INDEPENDENT_AMBULATORY_CARE_PROVIDER_SITE_OTHER): Payer: PPO | Admitting: Family Medicine

## 2018-09-01 ENCOUNTER — Encounter: Payer: Self-pay | Admitting: Family Medicine

## 2018-09-01 VITALS — BP 124/80 | HR 77 | Temp 97.8°F | Wt 208.0 lb

## 2018-09-01 DIAGNOSIS — E538 Deficiency of other specified B group vitamins: Secondary | ICD-10-CM | POA: Diagnosis not present

## 2018-09-01 DIAGNOSIS — I10 Essential (primary) hypertension: Secondary | ICD-10-CM

## 2018-09-01 DIAGNOSIS — I4891 Unspecified atrial fibrillation: Secondary | ICD-10-CM

## 2018-09-01 NOTE — Progress Notes (Signed)
   Subjective:    Patient ID: FRAZIER BALFOUR, male    DOB: 15-Aug-1928, 85 y.o.   MRN: [id number]  HPI Here to follow up on B12 deficiency. On 05-29-18 we discovered this with a blood level of 122. He as been getting weekly shots since then. He says he feels about the same as last time with some generalized weakness, but he gets around and does mostly what he wants to do.   Review of Systems  Constitutional: Negative.   Respiratory: Negative.   Cardiovascular: Negative.   Neurological: Positive for weakness. Negative for numbness.       Objective:   Physical Exam Constitutional:      Appearance: Normal appearance.     Comments: Walks with a cane   Cardiovascular:     Rate and Rhythm: Normal rate.     Pulses: Normal pulses.     Heart sounds: Normal heart sounds.     Comments: Irregular rhythm  Pulmonary:     Effort: Pulmonary effort is normal.     Breath sounds: Normal breath sounds.  Neurological:     General: No focal deficit present.     Mental Status: He is alert and oriented to person, place, and time.           Assessment & Plan:  B12 deficiency. We will check another level and go from there. His HTN and atrial fib are stable.  Alysia Penna, MD

## 2018-09-03 LAB — VITAMIN B12: Vitamin B-12: 519 pg/mL (ref 211–911)

## 2018-09-04 DIAGNOSIS — H34831 Tributary (branch) retinal vein occlusion, right eye, with macular edema: Secondary | ICD-10-CM | POA: Diagnosis not present

## 2018-09-09 ENCOUNTER — Other Ambulatory Visit: Payer: Self-pay | Admitting: Family Medicine

## 2018-09-14 ENCOUNTER — Ambulatory Visit (INDEPENDENT_AMBULATORY_CARE_PROVIDER_SITE_OTHER): Payer: PPO | Admitting: General Practice

## 2018-09-14 ENCOUNTER — Ambulatory Visit: Payer: PPO

## 2018-09-14 DIAGNOSIS — I4891 Unspecified atrial fibrillation: Secondary | ICD-10-CM

## 2018-09-14 DIAGNOSIS — Z7901 Long term (current) use of anticoagulants: Secondary | ICD-10-CM | POA: Diagnosis not present

## 2018-09-14 LAB — POCT INR: INR: 2.8 (ref 2.0–3.0)

## 2018-09-14 NOTE — Patient Instructions (Signed)
Pre visit review using our clinic review tool, if applicable. No additional management support is needed unless otherwise documented below in the visit note.  Continue to take 1 (5 mg) tablet on Sun/Tues/Thurs.and take 1 (2.5 mg tablet) on Monday and Fridays and Saturdays and take 1 mg on Wednesdays.  Re-check in 6 weeks.  

## 2018-09-24 ENCOUNTER — Ambulatory Visit (INDEPENDENT_AMBULATORY_CARE_PROVIDER_SITE_OTHER): Payer: PPO | Admitting: *Deleted

## 2018-09-24 DIAGNOSIS — E538 Deficiency of other specified B group vitamins: Secondary | ICD-10-CM

## 2018-09-24 MED ORDER — CYANOCOBALAMIN 1000 MCG/ML IJ SOLN
1000.0000 ug | Freq: Once | INTRAMUSCULAR | Status: AC
  Administered 2018-09-24: 1000 ug via INTRAMUSCULAR

## 2018-09-24 NOTE — Progress Notes (Signed)
Per orders of Dr. Fry, injection of Vit B12 given by GREEN, ASHTYN M. Patient tolerated injection well.  

## 2018-09-26 ENCOUNTER — Other Ambulatory Visit: Payer: Self-pay | Admitting: Family Medicine

## 2018-10-09 ENCOUNTER — Ambulatory Visit: Payer: PPO

## 2018-10-09 ENCOUNTER — Encounter: Payer: Self-pay | Admitting: Internal Medicine

## 2018-10-09 ENCOUNTER — Encounter (INDEPENDENT_AMBULATORY_CARE_PROVIDER_SITE_OTHER): Payer: Self-pay

## 2018-10-09 ENCOUNTER — Ambulatory Visit: Payer: PPO | Admitting: Internal Medicine

## 2018-10-09 VITALS — BP 134/80 | HR 90 | Ht 71.0 in | Wt 203.2 lb

## 2018-10-09 DIAGNOSIS — I679 Cerebrovascular disease, unspecified: Secondary | ICD-10-CM | POA: Diagnosis not present

## 2018-10-09 DIAGNOSIS — I10 Essential (primary) hypertension: Secondary | ICD-10-CM | POA: Diagnosis not present

## 2018-10-09 DIAGNOSIS — I4891 Unspecified atrial fibrillation: Secondary | ICD-10-CM

## 2018-10-09 DIAGNOSIS — E782 Mixed hyperlipidemia: Secondary | ICD-10-CM | POA: Diagnosis not present

## 2018-10-09 NOTE — Patient Instructions (Signed)
Medication Instructions:  No change If you need a refill on your cardiac medications before your next appointment, please call your pharmacy.   Lab work: none If you have labs (blood work) drawn today and your tests are completely normal, you will receive your results only by: [name] MyChart Message (if you have MyChart) OR . A paper copy in the mail If you have any lab test that is abnormal or we need to change your treatment, we will call you to review the results.  Testing/Procedures: none  Follow-Up: At [facility], you and your health needs are our priority.  As part of our continuing mission to provide you with exceptional heart care, we have created designated Provider Care Teams.  These Care Teams include your primary Cardiologist (physician) and Advanced Practice Providers (APPs -  Physician Assistants and Nurse Practitioners) who all work together to provide you with the care you need, when you need it. You will need a follow up appointment in:  12 months.  Please call our office 2 months in advance to schedule this appointment.  You may see [name] or one of the following Advanced Practice Providers on your designated Care Team: Richardson Dopp, PA-C [place], [place] . Daune Perch, NP  Any Other Special Instructions Will Be Listed Below (If Applicable).  Addendum: contacted Dr. Lynder Parents office (ophthalmology). Requested last ov note (Dec 2019)

## 2018-10-09 NOTE — Progress Notes (Signed)
Cardiology Office Note   Date:  10/09/2018   ID:  Justin Gallegos 1929/03/08, MRN [id number]  PCP:  Laurey Morale, MD  Cardiologist:   Dorris Carnes, MD   F/u of atrial fib      History of Present Illness: Justin Gallegos is a 85 y.o. male with a history of atrial fib, HL  I saw him in Feb 2019  The pt denies palpitations  Breathing is OK   No CP   No PND  Outpatient Medications Prior to Visit  Medication Sig Dispense Refill  . Aflibercept (EYLEA IO) Inject into the eye.    [name] atorvastatin (LIPITOR) 40 MG tablet TAKE 1 TABLET BY MOUTH ONCE DAILY 90 tablet 3  . benzonatate (TESSALON) 100 MG capsule Take 1 capsule (100 mg total) by mouth 2 (two) times daily as needed for cough. 20 capsule 0  . clotrimazole-betamethasone (LOTRISONE) cream Apply 1 application topically 2 (two) times daily. 45 g 2  . ezetimibe (ZETIA) 10 MG tablet TAKE 1 TABLET BY MOUTH ONCE DAILY 90 tablet 2  . Fenofibrate 150 MG CAPS Take 1 capsule (150 mg total) by mouth daily. 30 each 11  . fluticasone (FLONASE) 50 MCG/ACT nasal spray USE TWO SPRAYS IN EACH NOSTRIL AS NEEDED (Patient taking differently: USE TWO SPRAYS IN EACH NOSTRIL AS NEEDED over the counter) 16 g 0  . HYDROcodone-homatropine (HYCODAN) 5-1.5 MG/5ML syrup Take 5 mLs by mouth every 8 (eight) hours as needed for cough. 120 mL 0  . ketoconazole (NIZORAL) 2 % cream APPLY  CREAM TOPICALLY TO AFFECTED AREA TWICE DAILY 30 g 5  . metoprolol succinate (TOPROL-XL) 25 MG 24 hr tablet Take 1 tablet (25 mg total) by mouth daily. 90 tablet 3  . warfarin (COUMADIN) 1 MG tablet TAKE AS DIRECTED BY  ANTICOAGULATION  CLINIC.  USE  IN  ADDITION  TO  5  MG  TABLETS 30 tablet 11  . warfarin (COUMADIN) 2.5 MG tablet TAKE 2 TABLETS ON SUNDAY, TUESDAY, AND THURSDAY AND TAKE 1 TABLET ON MONDAY, FRIDAY, AND SATURDAY OR AS DIRECTED BY ANTICOAGULATION CLINIC 120 tablet 0  . warfarin (COUMADIN) 5 MG tablet TAKE AS DIRECTED BY ANTICOAGULATION CLINIC. 90 tablet 0   No  facility-administered medications prior to visit.      Allergies:   Penicillins and Amoxicillin-pot clavulanate   Past Medical History:  Diagnosis Date  . Arthritis   . Atrial fibrillation [facility])    sees [name]   . Cancer [facility])    ureteral, per Dr. Alinda Money   . Eye disease    sees [name]   . Hemorrhoids   . Hyperlipidemia   . Hypertension   . Low back pain   . TIA (transient ischemic attack)     Past Surgical History:  Procedure Laterality Date  . APPENDECTOMY    . CATARACT EXTRACTION, BILATERAL  2013   per [name]   . COLONOSCOPY  06-28-10   per [name], benign polyps and severe diverticulosis, no repeats needed   . HERNIA REPAIR    . NEPHRECTOMY    . TONSILLECTOMY    . URETERECTOMY       Social History:  The patient  reports that he has never smoked. He has never used smokeless tobacco. He reports that he does not drink alcohol or use drugs.   Family History:  The patient's family history includes Coronary artery disease in an other family member; Stroke in his father  and another family member.    ROS:  Please see the history of present illness. All other systems are reviewed and  Negative to the above problem except as noted.    PHYSICAL EXAM: VS:  BP 134/80   Pulse 90   Ht 5\' 11"  (1.803 m)   Wt 203 lb 3.2 oz (92.2 kg)   BMI 28.34 kg/m   GEN: Well nourished, well developed, in no acute distress  HEENT: normal  Neck: JVP is normal  No carotid bruits Cardiac: irreg irreg  ; no murmurs, rubs, or gallops,no edema  Respiratory: Relatively clear  GI: soft, nontender, nondistended, + BS  No hepatomegaly    EKG:  EKG is ordered today.  Atrial fib  9  bpm   IWMI        Lipid Panel    Component Value Date/Time   CHOL 134 11/21/2017 1040   TRIG 178 (H) 11/21/2017 1040   TRIG 178 (H) 07/24/2006 0829   HDL 28 (L) 11/21/2017 1040   CHOLHDL 4.8 11/21/2017 1040   CHOLHDL 5 08/21/2017 0950   VLDL 42.6 (H) 08/21/2017 0950   LDLCALC 70  11/21/2017 1040   LDLDIRECT 102.0 08/21/2017 0950      Wt Readings from Last 3 Encounters:  10/09/18 203 lb 3.2 oz (92.2 kg)  09/01/18 208 lb (94.3 kg)  07/06/18 204 lb 8 oz (92.8 kg)      ASSESSMENT AND PLAN:  1  Atrial fib   Keep on current meds Pt has INR at Dr Barbie Banner office  2  CV dz  Mild by USN  3  HL  LDL 70   HDL 28   Keep on Lpitor  4  HTN   Adquately controlled    F/U in 12 months      Current medicines are reviewed at length with the patient today.  The patient does not have concerns regarding medicines.  Signed, Dorris Carnes, MD  10/09/2018 12:09 PM    [address], [place], [place]  [address] Phone: [phone]; Fax: [phone]

## 2018-10-16 DIAGNOSIS — H34233 Retinal artery branch occlusion, bilateral: Secondary | ICD-10-CM | POA: Diagnosis not present

## 2018-10-16 DIAGNOSIS — H3563 Retinal hemorrhage, bilateral: Secondary | ICD-10-CM | POA: Diagnosis not present

## 2018-10-16 DIAGNOSIS — H34833 Tributary (branch) retinal vein occlusion, bilateral, with macular edema: Secondary | ICD-10-CM | POA: Diagnosis not present

## 2018-10-16 DIAGNOSIS — H3582 Retinal ischemia: Secondary | ICD-10-CM | POA: Diagnosis not present

## 2018-10-19 ENCOUNTER — Telehealth: Payer: Self-pay

## 2018-10-19 NOTE — Telephone Encounter (Signed)
Yes every month, thanks

## 2018-10-19 NOTE — Telephone Encounter (Signed)
Noted, will be able to give during awv. Routed to [name] to approve administration.  Copied from [place] [phone]. Topic: General - Other >> Oct 19, 2018 11:35 AM Yvette Rack wrote: Reason for CRM: Pt wife stated pt would like to have his B-12 injection on 10/26/18 when he comes in for his appt.

## 2018-10-20 NOTE — Telephone Encounter (Signed)
Will make note in appointment to administer monthly B12 per [name] order.

## 2018-10-23 NOTE — Progress Notes (Addendum)
Subjective:   Justin Gallegos is a 85 y.o. male who presents for Medicare Annual/Subsequent preventive examination.  Review of Systems:  No ROS.  Medicare Wellness Visit. Additional risk factors are reflected in the social history.  Cardiac Risk Factors include: advanced age (>86men, >42 women);hypertension;male gender;dyslipidemia;sedentary lifestyle Sleep patterns: no sleep issues and gets up 2 times nightly to void.    Home Safety/Smoke Alarms: Feels safe in home. Smoke alarms in place.  Living environment; residence and Firearm Safety: 1-story house/ trailer. Uses cane usually to ambulate. Hx falls. Safety modifications in place at home per wife at side, no need for additional equipment. Discussed idea of doing medical alert bracelet/gadget, but pt. Declined more information on resources.   Seat Belt Safety/Bike Helmet: Wears seat belt.     Male:   CCS- 06/2010, follow-up not indicated d/t age.      PSA-  Lab Results  Component Value Date   PSA 1.25 08/21/2017   PSA 1.28 07/18/2016   PSA 1.14 05/10/2015       Objective:    Vitals: BP 116/90 (BP Location: Right Arm, Patient Position: Sitting, Cuff Size: Normal)   Pulse 97   Temp 97.7 F (36.5 C)   Resp 16   Ht 5\' 11"  (1.803 m)   Wt 204 lb (92.5 kg)   SpO2 97%   BMI 28.45 kg/m   Body mass index is 28.45 kg/m.  Advanced Directives 10/26/2018 10/08/2017  Does Patient Have a Medical Advance Directive? No No  Would patient like information on creating a medical advance directive? Yes (MAU/Ambulatory/Procedural Areas - Information given) -    Tobacco Social History   Tobacco Use  Smoking Status Never Smoker  Smokeless Tobacco Never Used     Counseling given: Not Answered    Past Medical History:  Diagnosis Date  . Arthritis   . Atrial fibrillation [facility])    sees [name]   . Cancer [facility])    ureteral, per Dr. Alinda Money   . Eye disease    sees [name]   . Hemorrhoids   . Hyperlipidemia    . Hypertension   . Low back pain   . TIA (transient ischemic attack)    Past Surgical History:  Procedure Laterality Date  . APPENDECTOMY    . CATARACT EXTRACTION, BILATERAL  2013   per [name]   . COLONOSCOPY  06-28-10   per [name], benign polyps and severe diverticulosis, no repeats needed   . HERNIA REPAIR    . NEPHRECTOMY    . TONSILLECTOMY    . URETERECTOMY     Family History  Problem Relation Age of Onset  . Stroke Father   . Coronary artery disease Other        fhx  . Stroke Other        fhx   Social History   Socioeconomic History  . Marital status: Married    Spouse name: Not on file  . Number of children: 83  . Years of education: Not on file  . Highest education level: Not on file  Occupational History  . 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 Financial resource strain: Not hard at all  . Food insecurity:    Worry: Never true    Inability: Never true  . Transportation needs:    Medical: No    Non-medical: No  Tobacco Use  . Smoking status: Never  Smoker  . Smokeless tobacco: Never Used  Substance and Sexual Activity  . Alcohol use: No    Alcohol/week: 0.0 standard drinks  . Drug use: No  . Sexual activity: Not on file  Lifestyle  . Physical activity:    Days per week: 2 days    Minutes per session: 60 min  . Stress: Not at all  Relationships  . Social connections:    Talks on phone: Three times a week    Gets together: Once a week    Attends religious service: More than 4 times per year    Active member of club or organization: No    Attends meetings of clubs or organizations: Never    Relationship status: Married  Other Topics Concern  . Not on file  Social History Narrative   Lives with wife on one level house   Has nine children, (two local), with 24 grandchildren and 5 great grandchildren   Works with Physiological scientist 2 days/week at [employer]   Attends church with wife,  who currently drives him everywhere d/t poor vision    Outpatient Encounter Medications as of 10/26/2018  Medication Sig  . Aflibercept (EYLEA IO) Inject into the eye.  [name] atorvastatin (LIPITOR) 40 MG tablet TAKE 1 TABLET BY MOUTH ONCE DAILY  . Cholecalciferol (VITAMIN D) 125 MCG (5000 UT) CAPS Take by mouth.  . ezetimibe (ZETIA) 10 MG tablet Take 1 tablet (10 mg total) by mouth daily.  . Fenofibrate 150 MG CAPS Take 1 capsule (150 mg total) by mouth daily.  . fluticasone (FLONASE) 50 MCG/ACT nasal spray USE TWO SPRAYS IN EACH NOSTRIL AS NEEDED (Patient taking differently: USE TWO SPRAYS IN EACH NOSTRIL AS NEEDED over the counter)  . ketoconazole (NIZORAL) 2 % cream APPLY  CREAM TOPICALLY TO AFFECTED AREA TWICE DAILY  . metoprolol succinate (TOPROL-XL) 25 MG 24 hr tablet Take 1 tablet (25 mg total) by mouth daily.  [name] warfarin (COUMADIN) 1 MG tablet TAKE AS DIRECTED BY  ANTICOAGULATION  CLINIC.  USE  IN  ADDITION  TO  5  MG  TABLETS  . warfarin (COUMADIN) 2.5 MG tablet TAKE 2 TABLETS ON SUNDAY, TUESDAY, AND THURSDAY AND TAKE 1 TABLET ON MONDAY, FRIDAY, AND SATURDAY OR AS DIRECTED BY ANTICOAGULATION CLINIC  . warfarin (COUMADIN) 5 MG tablet TAKE AS DIRECTED BY ANTICOAGULATION CLINIC.  . [DISCONTINUED] clotrimazole-betamethasone (LOTRISONE) cream Apply 1 application topically 2 (two) times daily.  . [DISCONTINUED] ezetimibe (ZETIA) 10 MG tablet TAKE 1 TABLET BY MOUTH ONCE DAILY  . [DISCONTINUED] benzonatate (TESSALON) 100 MG capsule Take 1 capsule (100 mg total) by mouth 2 (two) times daily as needed for cough. (Patient not taking: Reported on 10/26/2018)  . [DISCONTINUED] HYDROcodone-homatropine (HYCODAN) 5-1.5 MG/5ML syrup Take 5 mLs by mouth every 8 (eight) hours as needed for cough. (Patient not taking: Reported on 10/26/2018)   No facility-administered encounter medications on file as of 10/26/2018.     Activities of Daily Living In your present state of health, do you have any difficulty  performing the following activities: 10/26/2018  Hearing? Y  Vision? Y  Difficulty concentrating or making decisions? N  Walking or climbing stairs? Y  Dressing or bathing? N  Doing errands, shopping? Y  Comment d/t inability to Physiological scientist and eating ? N  Using the Toilet? N  In the past six months, have you accidently leaked urine? Y  Comment occassional dribbling per pt., nothing new  Do you have problems with loss  of bowel control? N  Managing your Medications? N  Managing your Finances? N  Housekeeping or managing your Housekeeping? N  Some recent data might be hidden    Patient Care Team: Laurey Morale, MD as PCP - General   Assessment:   This is a routine wellness examination for [place]. Physical assessment deferred to PCP.   Exercise Activities and Dietary recommendations Current Exercise Habits: Structured exercise class, Type of exercise: walking(works with personal trainer 2 days/week), Intensity: Moderate, Exercise limited by: orthopedic condition(s);cardiac condition(s) Diet (meal preparation, eat out, water intake, caffeinated beverages, dairy products, fruits and vegetables): in general, a "healthy" diet  . Pt. eats a lot of eggs "which I know is not good for my cholesterol, but I'm 89".  :      Goals    . Exercise 150 min/wk Moderate Activity     Goal is to be 89 and continue with exercise     . Patient Stated     "Get better, no more falls".       Fall Risk Fall Risk  10/26/2018 10/08/2017 08/25/2015 07/14/2014  Falls in the past year? 1 No No No  Number falls in past yr: 1 - - -  Risk for fall due to : Impaired balance/gait;History of fall(s);Impaired vision;Impaired mobility - - -  Follow up Education provided;Falls prevention discussed - - -     Depression Screen PHQ 2/9 Scores 10/26/2018 10/08/2017 08/25/2015 07/14/2014  PHQ - 2 Score 0 0 0 0  PHQ- 9 Score 0 - - -    Cognitive Function MMSE - Mini Mental State Exam 10/08/2017  Not  completed: (No Data)       Ad8 score reviewed for issues:  Issues making decisions: no  Less interest in hobbies / activities: no  Repeats questions, stories (family complaining): no  Trouble using ordinary gadgets (microwave, computer, phone):no  Forgets the month or year: no  Mismanaging finances: no  Remembering appts: no  Daily problems with thinking and/or memory: no Ad8 score is= 0    Immunization History  Administered Date(s) Administered  . H1N1 07/21/2008  . Influenza Split 06/18/2011  . Influenza Whole 05/13/2007, 05/10/2008, 05/16/2009, 05/01/2010  . Influenza, High Dose Seasonal PF 05/10/2015, 04/17/2016, 05/05/2017, 05/13/2018  . Influenza,inj,Quad PF,6+ Mos 05/27/2013, 05/11/2014  . Pneumococcal Conjugate-13 05/11/2014  . Pneumococcal Polysaccharide-23 05/12/2005  . Td 11/02/2007  . Zoster 07/09/2006    Qualifies for Shingles Vaccine? Yes, advised to receive at local pharmacy.  Screening Tests Health Maintenance  Topic Date Due  . TETANUS/TDAP  11/01/2017  . INFLUENZA VACCINE  Completed  . PNA vac Low Risk Adult  Completed      Plan:    Continue doing brain stimulating activities (puzzles, reading, adult coloring books, staying active) to keep memory sharp.   Continue working on Psychologist, clinical with Physiological scientist. Refer to fall precaution information provided.  Bring a copy of your living will and/or healthcare power of attorney to your next office visit.  Refer to shingles vaccines information provided. Get shingles series at local pharmacy.   INR=2.8 today, great!  Let us know if you need anything! I have personally reviewed and noted the following in the patient's chart:   . Medical and social history . Use of alcohol, tobacco or illicit drugs  . Current medications and supplements . Functional ability and status . Nutritional status . Physical activity . Advanced directives . List of other physicians . Vitals .  Screenings to include cognitive,  depression, and falls . Referrals and appointments  In addition, I have reviewed and discussed with patient certain preventive protocols, quality metrics, and best practice recommendations. A written personalized care plan for preventive services as well as general preventive health recommendations were provided to patient.     Alphia Moh, RN  10/26/2018  I have read this note and agree with its contents.  Alysia Penna, MD

## 2018-10-26 ENCOUNTER — Telehealth: Payer: Self-pay

## 2018-10-26 ENCOUNTER — Other Ambulatory Visit: Payer: Self-pay

## 2018-10-26 ENCOUNTER — Ambulatory Visit (INDEPENDENT_AMBULATORY_CARE_PROVIDER_SITE_OTHER): Payer: PPO | Admitting: General Practice

## 2018-10-26 ENCOUNTER — Ambulatory Visit (INDEPENDENT_AMBULATORY_CARE_PROVIDER_SITE_OTHER): Payer: PPO

## 2018-10-26 VITALS — BP 116/90 | HR 97 | Temp 97.7°F | Resp 16 | Ht 71.0 in | Wt 204.0 lb

## 2018-10-26 DIAGNOSIS — Z7901 Long term (current) use of anticoagulants: Secondary | ICD-10-CM

## 2018-10-26 DIAGNOSIS — E782 Mixed hyperlipidemia: Secondary | ICD-10-CM | POA: Diagnosis not present

## 2018-10-26 DIAGNOSIS — I4891 Unspecified atrial fibrillation: Secondary | ICD-10-CM

## 2018-10-26 DIAGNOSIS — E538 Deficiency of other specified B group vitamins: Secondary | ICD-10-CM

## 2018-10-26 DIAGNOSIS — Z0000 Encounter for general adult medical examination without abnormal findings: Secondary | ICD-10-CM

## 2018-10-26 LAB — POCT INR: INR: 2.8 (ref 2.0–3.0)

## 2018-10-26 MED ORDER — CYANOCOBALAMIN 1000 MCG/ML IJ SOLN
1000.0000 ug | Freq: Once | INTRAMUSCULAR | Status: AC
  Administered 2018-10-26: 1000 ug via INTRAMUSCULAR

## 2018-10-26 MED ORDER — EZETIMIBE 10 MG PO TABS: 10.0000 mg | ORAL_TABLET | Freq: Every day | ORAL | 2 refills | Status: DC

## 2018-10-26 NOTE — Patient Instructions (Signed)
Pre visit review using our clinic review tool, if applicable. No additional management support is needed unless otherwise documented below in the visit note.  Continue to take 1 (5 mg) tablet on Sun/Tues/Thurs.and take 1 (2.5 mg tablet) on Monday and Fridays and Saturdays and take 1 mg on Wednesdays.  Re-check in 6 weeks.  

## 2018-10-26 NOTE — Progress Notes (Signed)
After obtaining consent, and per orders of Dr.Fry, injection of b12 given by Warden Fillers. Patient instructed to remain in clinic for 20 minutes afterwards, and to report any adverse reaction to me immediately.

## 2018-10-26 NOTE — Patient Instructions (Addendum)
Continue doing brain stimulating activities (puzzles, reading, adult coloring books, staying active) to keep memory sharp.   Continue working on Psychologist, clinical with Physiological scientist. Refer to fall precaution information provided.  Bring a copy of your living will and/or healthcare power of attorney to your next office visit.   Refer to shingles vaccines information provided. Get shingles series at local pharmacy.   INR=2.8 today, great!  Let us know if you need anything!   Mr. Justin Gallegos , Thank you for taking time to come for your Medicare Wellness Visit. I appreciate your ongoing commitment to your health goals. Please review the following plan we discussed and let me know if I can assist you in the future.   These are the goals we discussed: Goals    . Exercise 150 min/wk Moderate Activity     Goal is to be 89 and continue with exercise     . Patient Stated     "Get better, no more falls".       This is a list of the screening recommended for you and due dates:  Health Maintenance  Topic Date Due  . Tetanus Vaccine  11/01/2017  . Flu Shot  Completed  . Pneumonia vaccines  Completed     Fall Prevention in the Home, Adult Falls can cause injuries and can affect people from all age groups. There are many simple things that you can do to make your home safe and to help prevent falls. Ask for help when making these changes, if needed. What actions can I take to prevent falls? General instructions  Use good lighting in all rooms. Replace any light bulbs that burn out.  Turn on lights if it is dark. Use night-lights.  Place frequently used items in easy-to-reach places. Lower the shelves around your home if necessary.  Set up furniture so that there are clear paths around it. Avoid moving your furniture around.  Remove throw rugs and other tripping hazards from the floor.  Avoid walking on wet floors.  Fix any uneven floor surfaces.  Add color or contrast paint or  tape to grab bars and handrails in your home. Place contrasting color strips on the first and last steps of stairways.  When you use a stepladder, make sure that it is completely opened and that the sides are firmly locked. Have someone hold the ladder while you are using it. Do not climb a closed stepladder.  Be aware of any and all pets. What can I do in the bathroom?      Keep the floor dry. Immediately clean up any water that spills onto the floor.  Remove soap buildup in the tub or shower on a regular basis.  Use non-skid mats or decals on the floor of the tub or shower.  Attach bath mats securely with double-sided, non-slip rug tape.  If you need to sit down while you are in the shower, use a plastic, non-slip stool.  Install grab bars by the toilet and in the tub and shower. Do not use towel bars as grab bars. What can I do in the bedroom?  Make sure that a bedside light is easy to reach.  Do not use oversized bedding that drapes onto the floor.  Have a firm chair that has side arms to use for getting dressed. What can I do in the kitchen?  Clean up any spills right away.  If you need to reach for something above you, use a sturdy step stool  that has a grab bar.  Keep electrical cables out of the way.  Do not use floor polish or wax that makes floors slippery. If you must use wax, make sure that it is non-skid floor wax. What can I do in the stairways?  Do not leave any items on the stairs.  Make sure that you have a light switch at the top of the stairs and the bottom of the stairs. Have them installed if you do not have them.  Make sure that there are handrails on both sides of the stairs. Fix handrails that are broken or loose. Make sure that handrails are as long as the stairways.  Install non-slip stair treads on all stairs in your home.  Avoid having throw rugs at the top or bottom of stairways, or secure the rugs with carpet tape to prevent them from  moving.  Choose a carpet design that does not hide the edge of steps on the stairway.  Check any carpeting to make sure that it is firmly attached to the stairs. Fix any carpet that is loose or worn. What can I do on the outside of my home?  Use bright outdoor lighting.  Regularly repair the edges of walkways and driveways and fix any cracks.  Remove high doorway thresholds.  Trim any shrubbery on the main path into your home.  Regularly check that handrails are securely fastened and in good repair. Both sides of any steps should have handrails.  Install guardrails along the edges of any raised decks or porches.  Clear walkways of debris and clutter, including tools and rocks.  Have leaves, snow, and ice cleared regularly.  Use sand or salt on walkways during winter months.  In the garage, clean up any spills right away, including grease or oil spills. What other actions can I take?  Wear closed-toe shoes that fit well and support your feet. Wear shoes that have rubber soles or low heels.  Use mobility aids as needed, such as canes, walkers, scooters, and crutches.  Review your medicines with your health care provider. Some medicines can cause dizziness or changes in blood pressure, which increase your risk of falling. Talk with your health care provider about other ways that you can decrease your risk of falls. This may include working with a physical therapist or trainer to improve your strength, balance, and endurance. Where to find more information  Centers for Disease Control and Prevention, STEADI: WebmailGuide.co.za  [employer] on Aging: BrainJudge.co.uk Contact a health care provider if:  You are afraid of falling at home.  You feel weak, drowsy, or dizzy at home.  You fall at home. Summary  There are many simple things that you can do to make your home safe and to help prevent falls.  Ways to make your home safe include removing tripping  hazards and installing grab bars in the bathroom.  Ask for help when making these changes in your home. This information is not intended to replace advice given to you by your health care provider. Make sure you discuss any questions you have with your health care provider. Document Released: 07/19/2002 Document Revised: 03/13/2017 Document Reviewed: 03/13/2017 Elsevier Interactive Patient Education  2019 Elsevier Inc.   Zoster Vaccine, Recombinant injection What is this medicine? ZOSTER VACCINE (ZOS ter vak SEEN) is used to prevent shingles in adults 83 years old and over. This vaccine is not used to treat shingles or nerve pain from shingles. This medicine may be used for other  purposes; ask your health care provider or pharmacist if you have questions. COMMON BRAND NAME(S): [facility] What should I tell my health care provider before I take this medicine? They need to know if you have any of these conditions: -blood disorders or disease -cancer like leukemia or lymphoma -immune system problems or therapy -an unusual or allergic reaction to vaccines, other medications, foods, dyes, or preservatives -pregnant or trying to get pregnant -breast-feeding How should I use this medicine? This vaccine is for injection in a muscle. It is given by a health care professional. Talk to your pediatrician regarding the use of this medicine in children. This medicine is not approved for use in children. Overdosage: If you think you have taken too much of this medicine contact a poison control center or emergency room at once. NOTE: This medicine is only for you. Do not share this medicine with others. What if I miss a dose? Keep appointments for follow-up (booster) doses as directed. It is important not to miss your dose. Call your doctor or health care professional if you are unable to keep an appointment. What may interact with this medicine? -medicines that suppress your immune system -medicines to  treat cancer -steroid medicines like prednisone or cortisone This list may not describe all possible interactions. Give your health care provider a list of all the medicines, herbs, non-prescription drugs, or dietary supplements you use. Also tell them if you smoke, drink alcohol, or use illegal drugs. Some items may interact with your medicine. What should I watch for while using this medicine? Visit your doctor for regular check ups. This vaccine, like all vaccines, may not fully protect everyone. What side effects may I notice from receiving this medicine? Side effects that you should report to your doctor or health care professional as soon as possible: -allergic reactions like skin rash, itching or hives, swelling of the face, lips, or tongue -breathing problems Side effects that usually do not require medical attention (report these to your doctor or health care professional if they continue or are bothersome): -chills -headache -fever -nausea, vomiting -redness, warmth, pain, swelling or itching at site where injected -tiredness This list may not describe all possible side effects. Call your doctor for medical advice about side effects. You may report side effects to FDA at 1-800-FDA-1088. Where should I keep my medicine? This vaccine is only given in a clinic, pharmacy, doctor's office, or other health care setting and will not be stored at home. NOTE: This sheet is a summary. It may not cover all possible information. If you have questions about this medicine, talk to your doctor, pharmacist, or health care provider.  2019 Elsevier/Gold Standard (2017-03-10 13:20:30)   Health Maintenance, Male A healthy lifestyle and preventive care is important for your health and wellness. Ask your health care provider about what schedule of regular examinations is right for you. What should I know about weight and diet? Eat a Healthy Diet  Eat plenty of vegetables, fruits, whole grains,  low-fat dairy products, and lean protein.  Do not eat a lot of foods high in solid fats, added sugars, or salt.  Maintain a Healthy Weight Regular exercise can help you achieve or maintain a healthy weight. You should:  Do at least 150 minutes of exercise each week. The exercise should increase your heart rate and make you sweat (moderate-intensity exercise).  Do strength-training exercises at least twice a week. Watch Your Levels of Cholesterol and Blood Lipids  Have your blood tested for  lipids and cholesterol every 5 years starting at 83 years of age. If you are at high risk for heart disease, you should start having your blood tested when you are 83 years old. You may need to have your cholesterol levels checked more often if: ? Your lipid or cholesterol levels are high. ? You are older than 83 years of age. ? You are at high risk for heart disease. What should I know about cancer screening? Many types of cancers can be detected early and may often be prevented. Lung Cancer  You should be screened every year for lung cancer if: ? You are a current smoker who has smoked for at least 30 years. ? You are a former smoker who has quit within the past 15 years.  Talk to your health care provider about your screening options, when you should start screening, and how often you should be screened. Colorectal Cancer  Routine colorectal cancer screening usually begins at 83 years of age and should be repeated every 5-10 years until you are 83 years old. You may need to be screened more often if early forms of precancerous polyps or small growths are found. Your health care provider may recommend screening at an earlier age if you have risk factors for colon cancer.  Your health care provider may recommend using home test kits to check for hidden blood in the stool.  A small camera at the end of a tube can be used to examine your colon (sigmoidoscopy or colonoscopy). This checks for the  earliest forms of colorectal cancer. Prostate and Testicular Cancer  Depending on your age and overall health, your health care provider may do certain tests to screen for prostate and testicular cancer.  Talk to your health care provider about any symptoms or concerns you have about testicular or prostate cancer. Skin Cancer  Check your skin from head to toe regularly.  Tell your health care provider about any new moles or changes in moles, especially if: ? There is a change in a mole's size, shape, or color. ? You have a mole that is larger than a pencil eraser.  Always use sunscreen. Apply sunscreen liberally and repeat throughout the day.  Protect yourself by wearing long sleeves, pants, a wide-brimmed hat, and sunglasses when outside. What should I know about heart disease, diabetes, and high blood pressure?  If you are 66-28 years of age, have your blood pressure checked every 3-5 years. If you are 37 years of age or older, have your blood pressure checked every year. You should have your blood pressure measured twice-once when you are at a hospital or clinic, and once when you are not at a hospital or clinic. Record the average of the two measurements. To check your blood pressure when you are not at a hospital or clinic, you can use: ? An automated blood pressure machine at a pharmacy. ? A home blood pressure monitor.  Talk to your health care provider about your target blood pressure.  If you are between 63-60 years old, ask your health care provider if you should take aspirin to prevent heart disease.  Have regular diabetes screenings by checking your fasting blood sugar level. ? If you are at a normal weight and have a low risk for diabetes, have this test once every three years after the age of 51. ? If you are overweight and have a high risk for diabetes, consider being tested at a younger age or more often.  A one-time screening for abdominal aortic aneurysm (AAA) by  ultrasound is recommended for men aged 10-75 years who are current or former smokers. What should I know about preventing infection? Hepatitis B If you have a higher risk for hepatitis B, you should be screened for this virus. Talk with your health care provider to find out if you are at risk for hepatitis B infection. Hepatitis C Blood testing is recommended for:  Everyone born from [birth year] through 1963.  Anyone with known risk factors for hepatitis C. Sexually Transmitted Diseases (STDs)  You should be screened each year for STDs including gonorrhea and chlamydia if: ? You are sexually active and are younger than 83 years of age. ? You are older than 83 years of age and your health care provider tells you that you are at risk for this type of infection. ? Your sexual activity has changed since you were last screened and you are at an increased risk for chlamydia or gonorrhea. Ask your health care provider if you are at risk.  Talk with your health care provider about whether you are at high risk of being infected with HIV. Your health care provider may recommend a prescription medicine to help prevent HIV infection. What else can I do?  Schedule regular health, dental, and eye exams.  Stay current with your vaccines (immunizations).  Do not use any tobacco products, such as cigarettes, chewing tobacco, and e-cigarettes. If you need help quitting, ask your health care provider.  Limit alcohol intake to no more than 2 drinks per day. One drink equals 12 ounces of beer, 5 ounces of wine, or 1 ounces of hard liquor.  Do not use street drugs.  Do not share needles.  Ask your health care provider for help if you need support or information about quitting drugs.  Tell your health care provider if you often feel depressed.  Tell your health care provider if you have ever been abused or do not feel safe at home. This information is not intended to replace advice given to you by your  health care provider. Make sure you discuss any questions you have with your health care provider. Document Released: 01/25/2008 Document Revised: 03/27/2016 Document Reviewed: 05/02/2015 Elsevier Interactive Patient Education  2019 [employer].

## 2018-10-26 NOTE — Telephone Encounter (Signed)
Author phoned pt. to see if he was still planning on coming into office for AWV and INR check in midst of pandemic restrictions. Pt. stated he still planned on coming in.

## 2018-11-02 ENCOUNTER — Other Ambulatory Visit: Payer: Self-pay | Admitting: Family Medicine

## 2018-11-27 DIAGNOSIS — H34833 Tributary (branch) retinal vein occlusion, bilateral, with macular edema: Secondary | ICD-10-CM | POA: Diagnosis not present

## 2018-12-07 ENCOUNTER — Ambulatory Visit (INDEPENDENT_AMBULATORY_CARE_PROVIDER_SITE_OTHER): Payer: PPO | Admitting: General Practice

## 2018-12-07 DIAGNOSIS — Z7901 Long term (current) use of anticoagulants: Secondary | ICD-10-CM

## 2018-12-07 DIAGNOSIS — I4891 Unspecified atrial fibrillation: Secondary | ICD-10-CM

## 2018-12-07 LAB — POCT INR: INR: 2.4 (ref 2.0–3.0)

## 2018-12-07 NOTE — Patient Instructions (Addendum)
Pre visit review using our clinic review tool, if applicable. No additional management support is needed unless otherwise documented below in the visit note.  Continue to take 1 (5 mg) tablet on Sun/Tues/Thurs.and take 1 (2.5 mg tablet) on Monday and Fridays and Saturdays and take 1 mg on Wednesdays.  Re-check in 6 weeks.

## 2019-01-08 DIAGNOSIS — H34833 Tributary (branch) retinal vein occlusion, bilateral, with macular edema: Secondary | ICD-10-CM | POA: Diagnosis not present

## 2019-01-18 ENCOUNTER — Ambulatory Visit: Payer: PPO

## 2019-01-20 ENCOUNTER — Other Ambulatory Visit: Payer: Self-pay

## 2019-01-20 ENCOUNTER — Ambulatory Visit (INDEPENDENT_AMBULATORY_CARE_PROVIDER_SITE_OTHER): Payer: PPO | Admitting: General Practice

## 2019-01-20 DIAGNOSIS — Z7901 Long term (current) use of anticoagulants: Secondary | ICD-10-CM | POA: Diagnosis not present

## 2019-01-20 DIAGNOSIS — I4891 Unspecified atrial fibrillation: Secondary | ICD-10-CM

## 2019-01-20 LAB — POCT INR: INR: 3 (ref 2.0–3.0)

## 2019-01-20 NOTE — Patient Instructions (Addendum)
Pre visit review using our clinic review tool, if applicable. No additional management support is needed unless otherwise documented below in the visit note.  Continue to take 1 (5 mg) tablet on Sun/Tues/Thurs.and take 1 (2.5 mg tablet) on Monday and Fridays and Saturdays and take 1 mg on Wednesdays.  Re-check in 6 weeks.

## 2019-02-19 DIAGNOSIS — H34233 Retinal artery branch occlusion, bilateral: Secondary | ICD-10-CM | POA: Diagnosis not present

## 2019-02-19 DIAGNOSIS — H3563 Retinal hemorrhage, bilateral: Secondary | ICD-10-CM | POA: Diagnosis not present

## 2019-02-19 DIAGNOSIS — H34833 Tributary (branch) retinal vein occlusion, bilateral, with macular edema: Secondary | ICD-10-CM | POA: Diagnosis not present

## 2019-02-19 DIAGNOSIS — H3582 Retinal ischemia: Secondary | ICD-10-CM | POA: Diagnosis not present

## 2019-02-22 ENCOUNTER — Other Ambulatory Visit: Payer: Self-pay | Admitting: Family Medicine

## 2019-03-03 ENCOUNTER — Other Ambulatory Visit: Payer: Self-pay

## 2019-03-03 ENCOUNTER — Ambulatory Visit (INDEPENDENT_AMBULATORY_CARE_PROVIDER_SITE_OTHER): Payer: PPO | Admitting: General Practice

## 2019-03-03 DIAGNOSIS — I4891 Unspecified atrial fibrillation: Secondary | ICD-10-CM

## 2019-03-03 DIAGNOSIS — Z7901 Long term (current) use of anticoagulants: Secondary | ICD-10-CM | POA: Diagnosis not present

## 2019-03-03 LAB — POCT INR: INR: 3.9 — AB (ref 2.0–3.0)

## 2019-03-03 NOTE — Patient Instructions (Signed)
Pre visit review using our clinic review tool, if applicable. No additional management support is needed unless otherwise documented below in the visit note.   Skip dosage today and take 2.5 mg on Thursday. On Friday start taking 5 mg on Sun, Tues, Thurs and take 2.5 mg on Mon, Friday and Saturdays.  Re-check in 3 weeks.

## 2019-03-24 ENCOUNTER — Ambulatory Visit (INDEPENDENT_AMBULATORY_CARE_PROVIDER_SITE_OTHER): Payer: PPO | Admitting: General Practice

## 2019-03-24 DIAGNOSIS — Z7901 Long term (current) use of anticoagulants: Secondary | ICD-10-CM | POA: Diagnosis not present

## 2019-03-24 DIAGNOSIS — I4891 Unspecified atrial fibrillation: Secondary | ICD-10-CM

## 2019-03-24 LAB — POCT INR: INR: 3.2 — AB (ref 2.0–3.0)

## 2019-03-24 NOTE — Patient Instructions (Signed)
Pre visit review using our clinic review tool, if applicable. No additional management support is needed unless otherwise documented below in the visit note.  Skip dosage today and take 2.5 mg on Thursday. On Friday start taking 5 mg on Sun, Tues, Thurs and take 2.5 mg on Mon, Friday and Saturdays.  Re-check in 4 weeks.

## 2019-04-05 ENCOUNTER — Other Ambulatory Visit: Payer: Self-pay | Admitting: Family Medicine

## 2019-04-05 DIAGNOSIS — Z7901 Long term (current) use of anticoagulants: Secondary | ICD-10-CM

## 2019-04-13 ENCOUNTER — Ambulatory Visit: Payer: PPO | Admitting: Podiatry

## 2019-04-13 ENCOUNTER — Other Ambulatory Visit: Payer: Self-pay

## 2019-04-13 DIAGNOSIS — B351 Tinea unguium: Secondary | ICD-10-CM | POA: Diagnosis not present

## 2019-04-13 DIAGNOSIS — L600 Ingrowing nail: Secondary | ICD-10-CM

## 2019-04-13 DIAGNOSIS — M79676 Pain in unspecified toe(s): Secondary | ICD-10-CM

## 2019-04-13 DIAGNOSIS — Z7901 Long term (current) use of anticoagulants: Secondary | ICD-10-CM

## 2019-04-16 DIAGNOSIS — H3563 Retinal hemorrhage, bilateral: Secondary | ICD-10-CM | POA: Diagnosis not present

## 2019-04-16 DIAGNOSIS — H34833 Tributary (branch) retinal vein occlusion, bilateral, with macular edema: Secondary | ICD-10-CM | POA: Diagnosis not present

## 2019-04-16 DIAGNOSIS — H34233 Retinal artery branch occlusion, bilateral: Secondary | ICD-10-CM | POA: Diagnosis not present

## 2019-04-16 DIAGNOSIS — H3582 Retinal ischemia: Secondary | ICD-10-CM | POA: Diagnosis not present

## 2019-04-20 NOTE — Progress Notes (Signed)
Subjective: 85 year old male presents the office today for concerns of a possible ingrown toenail left big toe as well as his nails becoming thick and elongated he cannot trim them himself.  He states the ingrown toenails is not painful however he is not able to trim the nails and the nails growing into the skin more.  Denies any redness or drainage or any signs of infection of the toenail sites. Denies any systemic complaints such as fevers, chills, nausea, vomiting. No acute changes since last appointment, and no other complaints at this time.   Objective: AAO x3, NAD DP/PT pulses palpable bilaterally, CRT less than 3 seconds Nails are hypertrophic, dystrophic, brittle, discolored, elongated 10.  Incurvation present to bilateral hallux toenails left side worse.  No signs of infection.  No surrounding redness or drainage. Tenderness nails 1-5 bilaterally. No open lesions or pre-ulcerative lesions are identified today. No open lesions or pre-ulcerative lesions.  No pain with calf compression, swelling, warmth, erythema  Assessment: Symptomatically onychomycosis, ingrown toenail  Plan: -All treatment options discussed with the patient including all alternatives, risks, complications.  -Nails sharply debrided x 10 without any complications or bleeding.  Wish to hold off on partial nail avulsion and I also as well as the area is not painful or any signs of infection.  Need to monitor however. -Patient encouraged to call the office with any questions, concerns, change in symptoms.   Return in about 3 months (around 07/13/2019).  Trula Slade DPM

## 2019-04-26 ENCOUNTER — Ambulatory Visit (INDEPENDENT_AMBULATORY_CARE_PROVIDER_SITE_OTHER): Payer: PPO | Admitting: General Practice

## 2019-04-26 ENCOUNTER — Other Ambulatory Visit: Payer: Self-pay

## 2019-04-26 DIAGNOSIS — Z7901 Long term (current) use of anticoagulants: Secondary | ICD-10-CM | POA: Diagnosis not present

## 2019-04-26 LAB — POCT INR: INR: 3 (ref 2.0–3.0)

## 2019-04-26 NOTE — Patient Instructions (Addendum)
Pre visit review using our clinic review tool, if applicable. No additional management support is needed unless otherwise documented below in the visit note.  Continue to take 5 mg on Sun, Tues, Thurs and take 2.5 mg on Mon, Friday and Saturdays.  Re-check in 4 weeks.

## 2019-05-10 ENCOUNTER — Other Ambulatory Visit: Payer: Self-pay | Admitting: Family Medicine

## 2019-05-24 ENCOUNTER — Ambulatory Visit (INDEPENDENT_AMBULATORY_CARE_PROVIDER_SITE_OTHER): Payer: PPO | Admitting: General Practice

## 2019-05-24 ENCOUNTER — Other Ambulatory Visit: Payer: Self-pay

## 2019-05-24 DIAGNOSIS — Z7901 Long term (current) use of anticoagulants: Secondary | ICD-10-CM

## 2019-05-24 DIAGNOSIS — I4891 Unspecified atrial fibrillation: Secondary | ICD-10-CM

## 2019-05-24 DIAGNOSIS — Z23 Encounter for immunization: Secondary | ICD-10-CM | POA: Diagnosis not present

## 2019-05-24 LAB — POCT INR: INR: 2.4 (ref 2.0–3.0)

## 2019-05-24 NOTE — Patient Instructions (Signed)
Pre visit review using our clinic review tool, if applicable. No additional management support is needed unless otherwise documented below in the visit note.  Take an additional 1 mg tablet today (10/12) and then continue to take 5 mg on Sun, Tues, Thurs and take 2.5 mg on Mon, Friday and Saturdays.  Re-check in 4 weeks.

## 2019-06-18 DIAGNOSIS — H34233 Retinal artery branch occlusion, bilateral: Secondary | ICD-10-CM | POA: Diagnosis not present

## 2019-06-18 DIAGNOSIS — H3563 Retinal hemorrhage, bilateral: Secondary | ICD-10-CM | POA: Diagnosis not present

## 2019-06-18 DIAGNOSIS — H43813 Vitreous degeneration, bilateral: Secondary | ICD-10-CM | POA: Diagnosis not present

## 2019-06-18 DIAGNOSIS — H34833 Tributary (branch) retinal vein occlusion, bilateral, with macular edema: Secondary | ICD-10-CM | POA: Diagnosis not present

## 2019-06-21 ENCOUNTER — Other Ambulatory Visit: Payer: Self-pay

## 2019-06-21 ENCOUNTER — Ambulatory Visit (INDEPENDENT_AMBULATORY_CARE_PROVIDER_SITE_OTHER): Payer: PPO | Admitting: General Practice

## 2019-06-21 ENCOUNTER — Other Ambulatory Visit: Payer: Self-pay | Admitting: General Practice

## 2019-06-21 DIAGNOSIS — Z7901 Long term (current) use of anticoagulants: Secondary | ICD-10-CM | POA: Diagnosis not present

## 2019-06-21 LAB — POCT INR: INR: 2.9 (ref 2.0–3.0)

## 2019-06-21 MED ORDER — WARFARIN SODIUM 2.5 MG PO TABS: ORAL_TABLET | ORAL | 1 refills | Status: DC

## 2019-06-21 NOTE — Patient Instructions (Signed)
Pre visit review using our clinic review tool, if applicable. No additional management support is needed unless otherwise documented below in the visit note.  Continue to take 5 mg on Sun, Tues, Thurs and take 2.5 mg on Mon, Friday and Saturdays and nothing on Wednesdays.  Re-check in 4 weeks.

## 2019-07-13 ENCOUNTER — Ambulatory Visit: Payer: PPO | Admitting: Podiatry

## 2019-07-19 ENCOUNTER — Ambulatory Visit: Payer: PPO

## 2019-07-26 ENCOUNTER — Ambulatory Visit (INDEPENDENT_AMBULATORY_CARE_PROVIDER_SITE_OTHER): Payer: PPO | Admitting: General Practice

## 2019-07-26 ENCOUNTER — Other Ambulatory Visit: Payer: Self-pay

## 2019-07-26 DIAGNOSIS — Z7901 Long term (current) use of anticoagulants: Secondary | ICD-10-CM

## 2019-07-26 DIAGNOSIS — I4891 Unspecified atrial fibrillation: Secondary | ICD-10-CM

## 2019-07-26 LAB — POCT INR: INR: 2.6 (ref 2.0–3.0)

## 2019-07-26 NOTE — Patient Instructions (Addendum)
Pre visit review using our clinic review tool, if applicable. No additional management support is needed unless otherwise documented below in the visit note.  Continue to take 5 mg on Sun, Tues, Thurs and take 2.5 mg on Mon, Friday and Saturdays and nothing on Wednesdays.  Re-check in 6 weeks.  

## 2019-07-27 ENCOUNTER — Ambulatory Visit (INDEPENDENT_AMBULATORY_CARE_PROVIDER_SITE_OTHER): Payer: PPO | Admitting: Podiatry

## 2019-07-27 ENCOUNTER — Encounter: Payer: Self-pay | Admitting: Podiatry

## 2019-07-27 DIAGNOSIS — M79676 Pain in unspecified toe(s): Secondary | ICD-10-CM

## 2019-07-27 DIAGNOSIS — B351 Tinea unguium: Secondary | ICD-10-CM | POA: Diagnosis not present

## 2019-07-27 DIAGNOSIS — Z7901 Long term (current) use of anticoagulants: Secondary | ICD-10-CM

## 2019-07-27 NOTE — Progress Notes (Signed)
Subjective: 85 year old male presents the office today for concerns of a possible ingrown toenail left big toe as well as his nails becoming thick and elongated he cannot trim them himself.  Denies any redness or drainage or any signs of infection of the toenail sites. Denies any systemic complaints such as fevers, chills, nausea, vomiting. No acute changes since last appointment, and no other complaints at this time.   Objective: AAO x3, NAD DP/PT pulses palpable bilaterally, CRT less than 3 seconds Nails are hypertrophic, dystrophic, brittle, discolored, elongated 10.  Incurvation present to bilateral hallux toenails left side worse.  No signs of infection.  No surrounding redness or drainage. Tenderness nails 1-5 bilaterally. No open lesions or pre-ulcerative lesions are identified today. No open lesions or pre-ulcerative lesions.  No pain with calf compression, swelling, warmth, erythema  Assessment: Symptomatically onychomycosis, ingrown toenail  Plan: -All treatment options discussed with the patient including all alternatives, risks, complications.  -Nails sharply debrided x 10 without any complications or bleeding.  Wish to hold off on partial nail avulsion and I also as well as the area is not painful or any signs of infection.  Need to monitor however. -Patient encouraged to call the office with any questions, concerns, change in symptoms.   Return in about 3 months   Trula Slade DPM

## 2019-08-09 ENCOUNTER — Other Ambulatory Visit: Payer: Self-pay | Admitting: Family Medicine

## 2019-08-23 ENCOUNTER — Other Ambulatory Visit: Payer: Self-pay | Admitting: Family Medicine

## 2019-08-23 DIAGNOSIS — E782 Mixed hyperlipidemia: Secondary | ICD-10-CM

## 2019-08-31 DIAGNOSIS — H3563 Retinal hemorrhage, bilateral: Secondary | ICD-10-CM | POA: Diagnosis not present

## 2019-08-31 DIAGNOSIS — H3582 Retinal ischemia: Secondary | ICD-10-CM | POA: Diagnosis not present

## 2019-08-31 DIAGNOSIS — H34233 Retinal artery branch occlusion, bilateral: Secondary | ICD-10-CM | POA: Diagnosis not present

## 2019-08-31 DIAGNOSIS — H34833 Tributary (branch) retinal vein occlusion, bilateral, with macular edema: Secondary | ICD-10-CM | POA: Diagnosis not present

## 2019-09-07 ENCOUNTER — Other Ambulatory Visit: Payer: Self-pay

## 2019-09-08 ENCOUNTER — Ambulatory Visit (INDEPENDENT_AMBULATORY_CARE_PROVIDER_SITE_OTHER): Payer: PPO | Admitting: General Practice

## 2019-09-08 DIAGNOSIS — Z7901 Long term (current) use of anticoagulants: Secondary | ICD-10-CM

## 2019-09-08 DIAGNOSIS — I4891 Unspecified atrial fibrillation: Secondary | ICD-10-CM

## 2019-09-08 LAB — POCT INR: INR: 2.5 (ref 2.0–3.0)

## 2019-09-08 NOTE — Patient Instructions (Addendum)
Pre visit review using our clinic review tool, if applicable. No additional management support is needed unless otherwise documented below in the visit note.  Continue to take 5 mg on Sun, Tues, Thurs and take 2.5 mg on Mon, Friday and Saturdays and nothing on Wednesdays.  Re-check in 6 weeks.  

## 2019-09-20 ENCOUNTER — Other Ambulatory Visit: Payer: Self-pay | Admitting: Family Medicine

## 2019-09-20 DIAGNOSIS — Z7901 Long term (current) use of anticoagulants: Secondary | ICD-10-CM

## 2019-10-18 ENCOUNTER — Other Ambulatory Visit: Payer: Self-pay | Admitting: Family Medicine

## 2019-10-19 ENCOUNTER — Other Ambulatory Visit: Payer: Self-pay

## 2019-10-20 ENCOUNTER — Ambulatory Visit (INDEPENDENT_AMBULATORY_CARE_PROVIDER_SITE_OTHER): Payer: PPO | Admitting: General Practice

## 2019-10-20 DIAGNOSIS — Z7901 Long term (current) use of anticoagulants: Secondary | ICD-10-CM

## 2019-10-20 DIAGNOSIS — I4891 Unspecified atrial fibrillation: Secondary | ICD-10-CM

## 2019-10-20 LAB — POCT INR: INR: 2.9 (ref 2.0–3.0)

## 2019-10-20 NOTE — Patient Instructions (Signed)
Pre visit review using our clinic review tool, if applicable. No additional management support is needed unless otherwise documented below in the visit note.  Continue to take 5 mg on Sun, Tues, Thurs and take 2.5 mg on Mon, Friday and Saturdays and nothing on Wednesdays.  Re-check in 6 weeks.  

## 2019-10-26 ENCOUNTER — Encounter: Payer: Self-pay | Admitting: Podiatry

## 2019-10-26 ENCOUNTER — Ambulatory Visit: Payer: PPO | Admitting: Podiatry

## 2019-10-26 ENCOUNTER — Other Ambulatory Visit: Payer: Self-pay

## 2019-10-26 VITALS — Temp 94.3°F | Resp 16

## 2019-10-26 DIAGNOSIS — M79676 Pain in unspecified toe(s): Secondary | ICD-10-CM

## 2019-10-26 DIAGNOSIS — Z7901 Long term (current) use of anticoagulants: Secondary | ICD-10-CM

## 2019-10-26 DIAGNOSIS — B351 Tinea unguium: Secondary | ICD-10-CM | POA: Diagnosis not present

## 2019-10-26 DIAGNOSIS — B353 Tinea pedis: Secondary | ICD-10-CM | POA: Diagnosis not present

## 2019-10-26 MED ORDER — CLOTRIMAZOLE-BETAMETHASONE 1-0.05 % EX CREA: 1.0000 "application " | TOPICAL_CREAM | Freq: Two times a day (BID) | CUTANEOUS | 0 refills | Status: DC

## 2019-10-28 ENCOUNTER — Other Ambulatory Visit: Payer: Self-pay | Admitting: Family Medicine

## 2019-10-31 NOTE — Progress Notes (Signed)
Subjective: 86 y.o. returns the office today for painful, elongated, thickened toenails which he cannot trim himself. Denies any redness or drainage around the nails.  Also he his wife is noticed a rash on the side of his foot which is red and scaly.  Occasionally itches.  No open sores no drainage.  PCP: Laurey Morale, MD  Objective: AAO 3, NAD DP/PT pulses palpable, CRT less than 3 seconds Nails hypertrophic, dystrophic, elongated, brittle, discolored 10. There is tenderness overlying the nails 1-5 bilaterally. There is no surrounding erythema or drainage along the nail sites. Scaly, erythematous rash on the lateral aspect left foot as well as interdigitally consistent with tinea pedis.  There is no blisters or drainage or pus. No other open lesions or pre-ulcerative lesions are identified. No pain with calf compression, swelling, warmth, erythema.  Assessment: Patient presents with symptomatic onychomycosis; tinea pedis  Plan: -Treatment options including alternatives, risks, complications were discussed -Nails sharply debrided 10 without complication/bleeding. -Prescribe Lotrisone.  Discussed changing shoes and socks regularly. -Discussed daily foot inspection. If there are any changes, to call the office immediately.  -Follow-up in 3 months or sooner if any problems are to arise. In the meantime, encouraged to call the office with any questions, concerns, changes symptoms.  Celesta Gentile, DPM

## 2019-11-03 DIAGNOSIS — H34233 Retinal artery branch occlusion, bilateral: Secondary | ICD-10-CM | POA: Diagnosis not present

## 2019-11-03 DIAGNOSIS — H3563 Retinal hemorrhage, bilateral: Secondary | ICD-10-CM | POA: Diagnosis not present

## 2019-11-03 DIAGNOSIS — H34833 Tributary (branch) retinal vein occlusion, bilateral, with macular edema: Secondary | ICD-10-CM | POA: Diagnosis not present

## 2019-11-03 DIAGNOSIS — H3582 Retinal ischemia: Secondary | ICD-10-CM | POA: Diagnosis not present

## 2019-11-09 ENCOUNTER — Other Ambulatory Visit: Payer: Self-pay | Admitting: Family Medicine

## 2019-11-22 ENCOUNTER — Other Ambulatory Visit: Payer: Self-pay | Admitting: Family Medicine

## 2019-11-22 DIAGNOSIS — E782 Mixed hyperlipidemia: Secondary | ICD-10-CM

## 2019-11-30 ENCOUNTER — Other Ambulatory Visit: Payer: Self-pay

## 2019-12-01 ENCOUNTER — Ambulatory Visit (INDEPENDENT_AMBULATORY_CARE_PROVIDER_SITE_OTHER): Payer: PPO | Admitting: General Practice

## 2019-12-01 DIAGNOSIS — Z7901 Long term (current) use of anticoagulants: Secondary | ICD-10-CM | POA: Diagnosis not present

## 2019-12-01 DIAGNOSIS — I4891 Unspecified atrial fibrillation: Secondary | ICD-10-CM

## 2019-12-01 LAB — POCT INR: INR: 2.4 (ref 2.0–3.0)

## 2019-12-01 NOTE — Patient Instructions (Addendum)
Pre visit review using our clinic review tool, if applicable. No additional management support is needed unless otherwise documented below in the visit note.  Take 2.5 mg tablet today and then continue to take 5 mg on Sun, Tues, Thurs and take 2.5 mg on Mon, Friday and Saturdays and nothing on Wednesdays.  Re-check in 6 weeks.

## 2019-12-06 ENCOUNTER — Other Ambulatory Visit: Payer: Self-pay | Admitting: Family Medicine

## 2019-12-06 NOTE — Telephone Encounter (Signed)
Patient need to schedule an ov for more refills. Has not been seen in over a year.

## 2019-12-13 ENCOUNTER — Encounter: Payer: Self-pay | Admitting: Family Medicine

## 2019-12-13 ENCOUNTER — Other Ambulatory Visit: Payer: Self-pay

## 2019-12-13 ENCOUNTER — Ambulatory Visit (INDEPENDENT_AMBULATORY_CARE_PROVIDER_SITE_OTHER): Payer: PPO | Admitting: Family Medicine

## 2019-12-13 VITALS — BP 124/70 | HR 110 | Temp 98.1°F | Wt 209.6 lb

## 2019-12-13 DIAGNOSIS — B353 Tinea pedis: Secondary | ICD-10-CM

## 2019-12-13 MED ORDER — KETOCONAZOLE 200 MG PO TABS: 200.0000 mg | ORAL_TABLET | Freq: Two times a day (BID) | ORAL | 0 refills | Status: DC

## 2019-12-13 NOTE — Progress Notes (Signed)
   Subjective:    Patient ID: Justin Gallegos, male    DOB: 12/25/1928, 86 y.o.   MRN: [id number]  HPI Here for one month of swelling and a rash on the left foot. He saw Dr. Clementeen Graham, his podiatrist, who said this was "athlete's foot". He gave him Clotrimazole/betamethasone cream to use, but this has not helped.    Review of Systems  Constitutional: Negative.   Respiratory: Negative.   Cardiovascular: Negative.   Skin: Positive for rash.       Objective:   Physical Exam Constitutional:      Appearance: Normal appearance.  Cardiovascular:     Rate and Rhythm: Normal rate and regular rhythm.     Pulses: Normal pulses.     Heart sounds: Normal heart sounds.  Pulmonary:     Effort: Pulmonary effort is normal.     Breath sounds: Normal breath sounds.  Skin:    Comments: The dorsal and plantar left foot has patches or macular erythema with scaling   Neurological:     Mental Status: He is alert.           Assessment & Plan:  Tinea pedis, treat with 30 days of oral Ketoconazole.  Alysia Penna, MD

## 2019-12-16 ENCOUNTER — Telehealth: Payer: Self-pay | Admitting: Family Medicine

## 2019-12-16 NOTE — Telephone Encounter (Signed)
Pt's spouse, Sharad Guzman, stated that when they picked up his medication for atorvastatin the pharmacist informed them that there is a conflict between taking warfarin and atorvastatin. They stated that the pharmacy faxed over a form the past Monday 5/3 to approve this and haven't heard back. Informed pt that [name] is out of the office today and will be back tomorrow. The pt needs either PCP or CMA to contact the pharmacy to either ok this or make a change.    [place], [place]  Phone: [phone] Fax:  [phone]   Pt can be reached at [phone]

## 2019-12-17 DIAGNOSIS — Z8551 Personal history of malignant neoplasm of bladder: Secondary | ICD-10-CM | POA: Diagnosis not present

## 2019-12-17 NOTE — Telephone Encounter (Signed)
Pharmacy has been notified. They will reach out to the patient.

## 2019-12-17 NOTE — Telephone Encounter (Signed)
ATC. Phone call was disconnected. Unable to reach a second time.

## 2019-12-17 NOTE — Telephone Encounter (Signed)
Spoke with the patients pharmacy. They need permission to fill ketoconazole (NIZORAL) 200 MG tablet and atorvastatin not the warfarin.  Please advise.

## 2019-12-17 NOTE — Telephone Encounter (Signed)
There is no problem, tell him to take both of them as prescribed

## 2019-12-17 NOTE — Telephone Encounter (Signed)
I understand. Tell him to HOLD the Atorvastatin as long as he is taking Ketoconazole, then restart it afterwards

## 2019-12-20 ENCOUNTER — Other Ambulatory Visit: Payer: Self-pay | Admitting: Family Medicine

## 2019-12-20 DIAGNOSIS — Z7901 Long term (current) use of anticoagulants: Secondary | ICD-10-CM

## 2019-12-20 NOTE — Telephone Encounter (Signed)
Please advise 

## 2019-12-29 ENCOUNTER — Other Ambulatory Visit: Payer: Self-pay

## 2019-12-30 ENCOUNTER — Ambulatory Visit (INDEPENDENT_AMBULATORY_CARE_PROVIDER_SITE_OTHER): Payer: PPO | Admitting: Family Medicine

## 2019-12-30 ENCOUNTER — Encounter: Payer: Self-pay | Admitting: Family Medicine

## 2019-12-30 VITALS — BP 122/68 | HR 73 | Temp 98.0°F | Wt 209.2 lb

## 2019-12-30 DIAGNOSIS — Z125 Encounter for screening for malignant neoplasm of prostate: Secondary | ICD-10-CM

## 2019-12-30 DIAGNOSIS — Z0000 Encounter for general adult medical examination without abnormal findings: Secondary | ICD-10-CM

## 2019-12-30 LAB — BASIC METABOLIC PANEL
BUN: 23 mg/dL (ref 6–23)
CO2: 24 mEq/L (ref 19–32)
Calcium: 8.6 mg/dL (ref 8.4–10.5)
Chloride: 105 mEq/L (ref 96–112)
Creatinine, Ser: 1.9 mg/dL — ABNORMAL HIGH (ref 0.40–1.50)
GFR: 33.53 mL/min — ABNORMAL LOW (ref >60.00)
Glucose, Bld: 113 mg/dL — ABNORMAL HIGH (ref 70–99)
Potassium: 4.5 mEq/L (ref 3.5–5.1)
Sodium: 136 mEq/L (ref 135–145)

## 2019-12-30 LAB — CBC WITH DIFFERENTIAL/PLATELET
Basophils Absolute: 0 10*3/uL (ref 0.0–0.1)
Basophils Relative: 0.7 % (ref 0.0–3.0)
Eosinophils Absolute: 0.1 10*3/uL (ref 0.0–0.7)
Eosinophils Relative: 1.5 % (ref 0.0–5.0)
HCT: 44.2 % (ref 39.0–52.0)
Hemoglobin: 15.4 g/dL (ref 13.0–17.0)
Lymphocytes Relative: 34.9 % (ref 12.0–46.0)
Lymphs Abs: 1.7 10*3/uL (ref 0.7–4.0)
MCHC: 34.8 g/dL (ref 30.0–36.0)
MCV: 92.7 fl (ref 78.0–100.0)
Monocytes Absolute: 0.7 10*3/uL (ref 0.1–1.0)
Monocytes Relative: 13.3 % — ABNORMAL HIGH (ref 3.0–12.0)
Neutro Abs: 2.5 10*3/uL (ref 1.4–7.7)
Neutrophils Relative %: 49.6 % (ref 43.0–77.0)
Platelets: 155 10*3/uL (ref 150.0–400.0)
RBC: 4.77 Mil/uL (ref 4.22–5.81)
RDW: 14.3 % (ref 11.5–15.5)
WBC: 5 10*3/uL (ref 4.0–10.5)

## 2019-12-30 LAB — PSA: PSA: 2.6 ng/mL (ref 0.10–4.00)

## 2019-12-30 LAB — HEPATIC FUNCTION PANEL
ALT: 16 U/L (ref 0–53)
AST: 22 U/L (ref 0–37)
Albumin: 3.9 g/dL (ref 3.5–5.2)
Alkaline Phosphatase: 61 U/L (ref 39–117)
Bilirubin, Direct: 0.1 mg/dL (ref 0.0–0.3)
Total Bilirubin: 0.7 mg/dL (ref 0.2–1.2)
Total Protein: 6.1 g/dL (ref 6.0–8.3)

## 2019-12-30 LAB — LIPID PANEL
Cholesterol: 178 mg/dL (ref 0–200)
HDL: 35.3 mg/dL — ABNORMAL LOW (ref >39.00)
NonHDL: 142.43
Total CHOL/HDL Ratio: 5
Triglycerides: 312 mg/dL — ABNORMAL HIGH (ref 0.0–149.0)
VLDL: 62.4 mg/dL — ABNORMAL HIGH (ref 0.0–40.0)

## 2019-12-30 LAB — TSH: TSH: 3.28 u[IU]/mL (ref 0.35–4.50)

## 2019-12-30 LAB — LDL CHOLESTEROL, DIRECT: Direct LDL: 97 mg/dL

## 2019-12-30 MED ORDER — KETOCONAZOLE 2 % EX CREA: TOPICAL_CREAM | CUTANEOUS | 5 refills | Status: DC

## 2019-12-30 NOTE — Progress Notes (Signed)
   Subjective:    Patient ID: Justin Gallegos, male    DOB: 1929-06-16, 86 y.o.   MRN: [id number]  HPI Here for a well exam. He feels fine in general. He last saw [name] in February 2020.   Review of Systems  Constitutional: Negative.   HENT: Negative.   Eyes: Negative.   Respiratory: Negative.   Cardiovascular: Negative.   Gastrointestinal: Negative.   Genitourinary: Negative.   Musculoskeletal: Negative.   Skin: Negative.   Neurological: Negative.   Psychiatric/Behavioral: Negative.        Objective:   Physical Exam Constitutional:      General: He is not in acute distress.    Appearance: Normal appearance. He is well-developed. He is not diaphoretic.  HENT:     Head: Normocephalic and atraumatic.     Right Ear: External ear normal.     Left Ear: External ear normal.     Nose: Nose normal.     Mouth/Throat:     Pharynx: No oropharyngeal exudate.  Eyes:     General: No scleral icterus.       Right eye: No discharge.        Left eye: No discharge.     Conjunctiva/sclera: Conjunctivae normal.     Pupils: Pupils are equal, round, and reactive to light.  Neck:     Thyroid: No thyromegaly.     Vascular: No JVD.     Trachea: No tracheal deviation.  Cardiovascular:     Rate and Rhythm: Normal rate and regular rhythm.     Heart sounds: Normal heart sounds. No murmur. No friction rub. No gallop.   Pulmonary:     Effort: Pulmonary effort is normal. No respiratory distress.     Breath sounds: Normal breath sounds. No wheezing or rales.  Chest:     Chest wall: No tenderness.  Abdominal:     General: Bowel sounds are normal. There is no distension.     Palpations: Abdomen is soft. There is no mass.     Tenderness: There is no abdominal tenderness. There is no guarding or rebound.  Genitourinary:    Penis: Normal. No tenderness.      Testes: Normal.  Musculoskeletal:        General: No tenderness. Normal range of motion.     Cervical back: Neck supple.    Lymphadenopathy:     Cervical: No cervical adenopathy.  Skin:    General: Skin is warm and dry.     Coloration: Skin is not pale.     Findings: No erythema or rash.  Neurological:     Mental Status: He is alert and oriented to person, place, and time.     Cranial Nerves: No cranial nerve deficit.     Motor: No abnormal muscle tone.     Coordination: Coordination normal.     Deep Tendon Reflexes: Reflexes are normal and symmetric. Reflexes normal.  Psychiatric:        Behavior: Behavior normal.        Thought Content: Thought content normal.        Judgment: Judgment normal.           Assessment & Plan:  Well exam. We discussed diet and exercise. Get fasting labs. They will contact [name]' office to set up another visit.  Alysia Penna, MD

## 2020-01-05 DIAGNOSIS — H34233 Retinal artery branch occlusion, bilateral: Secondary | ICD-10-CM | POA: Diagnosis not present

## 2020-01-05 DIAGNOSIS — H43813 Vitreous degeneration, bilateral: Secondary | ICD-10-CM | POA: Diagnosis not present

## 2020-01-05 DIAGNOSIS — H34833 Tributary (branch) retinal vein occlusion, bilateral, with macular edema: Secondary | ICD-10-CM | POA: Diagnosis not present

## 2020-01-08 ENCOUNTER — Other Ambulatory Visit: Payer: Self-pay | Admitting: Family Medicine

## 2020-01-10 ENCOUNTER — Other Ambulatory Visit: Payer: Self-pay | Admitting: Family Medicine

## 2020-01-10 DIAGNOSIS — Z7901 Long term (current) use of anticoagulants: Secondary | ICD-10-CM

## 2020-01-11 ENCOUNTER — Other Ambulatory Visit: Payer: Self-pay

## 2020-01-12 ENCOUNTER — Ambulatory Visit (INDEPENDENT_AMBULATORY_CARE_PROVIDER_SITE_OTHER): Payer: PPO | Admitting: General Practice

## 2020-01-12 DIAGNOSIS — Z7901 Long term (current) use of anticoagulants: Secondary | ICD-10-CM | POA: Diagnosis not present

## 2020-01-12 DIAGNOSIS — I4891 Unspecified atrial fibrillation: Secondary | ICD-10-CM

## 2020-01-12 LAB — POCT INR: INR: 2.5 (ref 2.0–3.0)

## 2020-01-12 NOTE — Patient Instructions (Addendum)
Pre visit review using our clinic review tool, if applicable. No additional management support is needed unless otherwise documented below in the visit note.  Change dosage and take 5 mg on Sun, Tues, Thurs and take 2.5 mg on Mon, Friday and Saturdays and nothing on Wednesdays.  Re-check in 6 weeks.

## 2020-01-25 ENCOUNTER — Ambulatory Visit: Payer: PPO | Admitting: Podiatry

## 2020-02-09 ENCOUNTER — Encounter: Payer: Self-pay | Admitting: Family Medicine

## 2020-02-09 ENCOUNTER — Ambulatory Visit (INDEPENDENT_AMBULATORY_CARE_PROVIDER_SITE_OTHER): Payer: PPO | Admitting: Family Medicine

## 2020-02-09 ENCOUNTER — Other Ambulatory Visit: Payer: Self-pay

## 2020-02-09 VITALS — BP 130/70 | HR 93 | Temp 98.2°F | Wt 210.6 lb

## 2020-02-09 DIAGNOSIS — M25472 Effusion, left ankle: Secondary | ICD-10-CM

## 2020-02-09 DIAGNOSIS — I10 Essential (primary) hypertension: Secondary | ICD-10-CM | POA: Diagnosis not present

## 2020-02-09 DIAGNOSIS — M25471 Effusion, right ankle: Secondary | ICD-10-CM | POA: Insufficient documentation

## 2020-02-09 DIAGNOSIS — N259 Disorder resulting from impaired renal tubular function, unspecified: Secondary | ICD-10-CM | POA: Diagnosis not present

## 2020-02-09 MED ORDER — FUROSEMIDE 20 MG PO TABS: 20.0000 mg | ORAL_TABLET | Freq: Every day | ORAL | 5 refills | Status: DC

## 2020-02-09 NOTE — Progress Notes (Signed)
   Subjective:    Patient ID: Justin Gallegos, male    DOB: [birth year], 86 y.o.   MRN: [id number]  HPI Here for several weeks of increased swelling in the feet and ankles. He has has this to some degree for years. There is no pain. No SOB. His creatinine is stable (it was 1.9 in May).    Review of Systems  Constitutional: Negative.   Respiratory: Negative.   Cardiovascular: Positive for leg swelling. Negative for chest pain and palpitations.       Objective:   Physical Exam Constitutional:      Appearance: Normal appearance. He is not ill-appearing.  Cardiovascular:     Rate and Rhythm: Normal rate and regular rhythm.     Pulses: Normal pulses.     Heart sounds: Normal heart sounds.  Pulmonary:     Effort: Pulmonary effort is normal. No respiratory distress.     Breath sounds: Normal breath sounds. No stridor. No wheezing, rhonchi or rales.  Musculoskeletal:     Comments: 2+ edema in both ankles and feet   Neurological:     Mental Status: He is alert.           Assessment & Plan:  Ankle edema, try Lasix 20 mg each morning.  Alysia Penna, MD

## 2020-02-15 ENCOUNTER — Other Ambulatory Visit: Payer: Self-pay | Admitting: Family Medicine

## 2020-02-15 DIAGNOSIS — E782 Mixed hyperlipidemia: Secondary | ICD-10-CM

## 2020-02-23 ENCOUNTER — Ambulatory Visit (INDEPENDENT_AMBULATORY_CARE_PROVIDER_SITE_OTHER): Payer: PPO | Admitting: General Practice

## 2020-02-23 ENCOUNTER — Other Ambulatory Visit: Payer: Self-pay

## 2020-02-23 DIAGNOSIS — Z7901 Long term (current) use of anticoagulants: Secondary | ICD-10-CM | POA: Diagnosis not present

## 2020-02-23 DIAGNOSIS — I4891 Unspecified atrial fibrillation: Secondary | ICD-10-CM | POA: Diagnosis not present

## 2020-02-23 LAB — POCT INR: INR: 2.7 (ref 2.0–3.0)

## 2020-02-23 NOTE — Patient Instructions (Signed)
Pre visit review using our clinic review tool, if applicable. No additional management support is needed unless otherwise documented below in the visit note.  Continue to take 5 mg on Sun, Tues, Thurs and take 2.5 mg on Mon, Friday and Saturdays and nothing on Wednesdays.  Re-check in 6 weeks.  

## 2020-03-15 DIAGNOSIS — H3582 Retinal ischemia: Secondary | ICD-10-CM | POA: Diagnosis not present

## 2020-03-15 DIAGNOSIS — H34833 Tributary (branch) retinal vein occlusion, bilateral, with macular edema: Secondary | ICD-10-CM | POA: Diagnosis not present

## 2020-03-15 DIAGNOSIS — H43813 Vitreous degeneration, bilateral: Secondary | ICD-10-CM | POA: Diagnosis not present

## 2020-03-15 DIAGNOSIS — H3563 Retinal hemorrhage, bilateral: Secondary | ICD-10-CM | POA: Diagnosis not present

## 2020-03-27 ENCOUNTER — Encounter: Payer: Self-pay | Admitting: Internal Medicine

## 2020-03-27 ENCOUNTER — Other Ambulatory Visit: Payer: Self-pay | Admitting: Family Medicine

## 2020-03-27 ENCOUNTER — Other Ambulatory Visit: Payer: Self-pay

## 2020-03-27 ENCOUNTER — Ambulatory Visit: Payer: PPO | Admitting: Internal Medicine

## 2020-03-27 VITALS — BP 108/78 | HR 107 | Ht 71.0 in | Wt 202.2 lb

## 2020-03-27 DIAGNOSIS — Z7901 Long term (current) use of anticoagulants: Secondary | ICD-10-CM

## 2020-03-27 DIAGNOSIS — I4891 Unspecified atrial fibrillation: Secondary | ICD-10-CM

## 2020-03-27 MED ORDER — FUROSEMIDE 20 MG PO TABS
20.0000 mg | ORAL_TABLET | ORAL | 3 refills | Status: DC
Start: 2020-03-27 — End: 2021-05-03

## 2020-03-27 NOTE — Patient Instructions (Addendum)
Medication Instructions:  Your physician has recommended you make the following change in your medication:  1.) change furosemide (Lasix) 20 mg to --one tablet EVERY OTHER DAY and minimize salt/sodium in your diet.  *If you need a refill on your cardiac medications before your next appointment, please call your pharmacy*   Lab Work: none If you have labs (blood work) drawn today and your tests are completely normal, you will receive your results only by: [name] MyChart Message (if you have MyChart) OR . A paper copy in the mail If you have any lab test that is abnormal or we need to change your treatment, we will call you to review the results.   Testing/Procedures: Your physician has requested that you have an echocardiogram. Echocardiography is a painless test that uses sound waves to create images of your heart. It provides your doctor with information about the size and shape of your heart and how well your heart's chambers and valves are working. This procedure takes approximately one hour. There are no restrictions for this procedure.   Follow-Up: At [facility], you and your health needs are our priority.  As part of our continuing mission to provide you with exceptional heart care, we have created designated Provider Care Teams.  These Care Teams include your primary Cardiologist (physician) and Advanced Practice Providers (APPs -  Physician Assistants and Nurse Practitioners) who all work together to provide you with the care you need, when you need it.  We recommend signing up for the patient portal called "MyChart".  Sign up information is provided on this After Visit Summary.  MyChart is used to connect with patients for Virtual Visits (Telemedicine).  Patients are able to view lab/test results, encounter notes, upcoming appointments, etc.  Non-urgent messages can be sent to your provider as well.   To learn more about what you can do with MyChart, go to [email or web address].     Your next appointment:   6 month(s)  The format for your next appointment:   In Person  Provider:   You may see Dorris Carnes, MD or one of the following Advanced Practice Providers on your designated Care Team:    Richardson Dopp, PA-C  [place], [place]   Other Instructions Decrease sodium intake

## 2020-03-27 NOTE — Progress Notes (Signed)
Cardiology Office Note   Date:  03/27/2020   ID:  Justin Gallegos, Justin Gallegos January 24, 1929, MRN [id number]  PCP:  Laurey Morale, MD  Cardiologist:   Dorris Carnes, MD   F/u of atrial fib      History of Present Illness: Justin Gallegos is a 86 y.o. male with a history of atrial fib, HL  I saw him in 2020 SInce seen he denies dizziness No palpitaitons No CP   No SOB   Does have some swelling in legs   Admits to adding salt to food    Outpatient Medications Prior to Visit  Medication Sig Dispense Refill  . Aflibercept (EYLEA IO) Inject into the eye.    [name] atorvastatin (LIPITOR) 40 MG tablet TAKE 1 TABLET BY MOUTH ONCE DAILY . APPOINTMENT REQUIRED FOR FUTURE REFILLS 90 tablet 0  . Cholecalciferol (VITAMIN D) 125 MCG (5000 UT) CAPS Take by mouth.    . ezetimibe (ZETIA) 10 MG tablet Take 1 tablet by mouth once daily 90 tablet 0  . Fenofibrate 150 MG CAPS Take 1 capsule by mouth once daily 30 capsule 5  . fluticasone (FLONASE) 50 MCG/ACT nasal spray USE TWO SPRAYS IN EACH NOSTRIL AS NEEDED (Patient taking differently: Place 1 spray into both nostrils daily. ) 16 g 0  . furosemide (LASIX) 20 MG tablet Take 1 tablet (20 mg total) by mouth daily. 30 tablet 5  . metoprolol succinate (TOPROL-XL) 25 MG 24 hr tablet TAKE 1 TABLET BY MOUTH ONCE DAILY . APPOINTMENT REQUIRED FOR FUTURE REFILLS 90 tablet 0  . tobramycin (TOBREX) 0.3 % ophthalmic solution INSTILL 1 DROP INTO RIGHT EYE 4 TIMES DAILY ONE DAY PRIOR TO PROCEDURE THE DAY OF PROCEDURE AND ONE DAY AFTER PROCEDURE THEN STOP    . warfarin (COUMADIN) 2.5 MG tablet TAKE 1 TABLET BY MOUTH ON MONDAY, FRIDAY, AND SATURDAY OR AS DIRECTED BY THE ANTICOAGULATION CLINIC 40 tablet 0  . warfarin (COUMADIN) 5 MG tablet TAKE 1 TABLET BY MOUTH ONCE DAILY ON SUNDAYS, TUESDAYS, AND THURSDAYS. OR AS DIRECTED BY ANTICOAGULATION CLINIC 40 tablet 0  . clotrimazole-betamethasone (LOTRISONE) cream Apply 1 application topically 2 (two) times daily. (Patient not taking:  Reported on 03/27/2020) 30 g 0  . ketoconazole (NIZORAL) 2 % cream APPLY  CREAM TOPICALLY TO AFFECTED AREA TWICE DAILY (Patient not taking: Reported on 03/27/2020) 30 g 5   No facility-administered medications prior to visit.     Allergies:   Penicillins and Amoxicillin-pot clavulanate   Past Medical History:  Diagnosis Date  . Arthritis   . Atrial fibrillation [facility])    sees [name]   . Cancer [facility])    ureteral, per Dr. Alinda Money   . Eye disease    sees [name]   . Hemorrhoids   . Hyperlipidemia   . Hypertension   . Low back pain   . TIA (transient ischemic attack)     Past Surgical History:  Procedure Laterality Date  . APPENDECTOMY    . CATARACT EXTRACTION, BILATERAL  2013   per [name]   . COLONOSCOPY  06-28-10   per [name], benign polyps and severe diverticulosis, no repeats needed   . HERNIA REPAIR    . NEPHRECTOMY    . TONSILLECTOMY    . URETERECTOMY       Social History:  The patient  reports that he has never smoked. He has never used smokeless tobacco. He reports that he does not drink alcohol and does  not use drugs.   Family History:  The patient's family history includes Coronary artery disease in an other family member; Stroke in his father and another family member.    ROS:  Please see the history of present illness. All other systems are reviewed and  Negative to the above problem except as noted.    PHYSICAL EXAM: VS:  BP 108/78   Pulse (!) 107   Ht 5\' 11"  (1.803 m)   Wt 202 lb 3.2 oz (91.7 kg)   SpO2 97%   BMI 28.20 kg/m   GEN: Well nourished, well developed, in no acute distress  HEENT: normal  Neck: JVP is not elevated Cardiac: irreg irreg  ; no murmurs, No LE  edema  Respiratory: Relatively clear  GI: soft, nontender, nondistended, + BS  No hepatomegaly  Ext with Tr LLE edema   EKG:  EKG is ordered today.  Atrial fib  104 bpm   IWMI  (old)   Nonspecifc ST changes     Lipid Panel    Component Value Date/Time    CHOL 178 12/30/2019 1112   CHOL 134 11/21/2017 1040   TRIG 312.0 (H) 12/30/2019 1112   TRIG 178 (H) 07/24/2006 0829   HDL 35.30 (L) 12/30/2019 1112   HDL 28 (L) 11/21/2017 1040   CHOLHDL 5 12/30/2019 1112   VLDL 62.4 (H) 12/30/2019 1112   LDLCALC 70 11/21/2017 1040   LDLDIRECT 97.0 12/30/2019 1112      Wt Readings from Last 3 Encounters:  03/27/20 202 lb 3.2 oz (91.7 kg)  02/09/20 210 lb 9.6 oz (95.5 kg)  12/30/19 209 lb 3.2 oz (94.9 kg)      ASSESSMENT AND PLAN:  1  Atrial fib  Continue on Toprol and coumadin   INR followed at Dr Barbie Banner office Will set up for an echo to reeval LVEF given mild edema Watch salt    2  CV dz  Mild on SN in 2019    3  HL  LDL 97    HDL 35    Keep on Lpitor for now   Watch diet   4  HTN   Adquately controlled on current meds      F/U in 6 months      Current medicines are reviewed at length with the patient today.  The patient does not have concerns regarding medicines.  Signed, Dorris Carnes, MD  03/27/2020 11:35 AM    [address], [place], [place]  [address] Phone: [phone]; Fax: [phone]

## 2020-04-05 ENCOUNTER — Ambulatory Visit (INDEPENDENT_AMBULATORY_CARE_PROVIDER_SITE_OTHER): Payer: PPO | Admitting: General Practice

## 2020-04-05 ENCOUNTER — Other Ambulatory Visit: Payer: Self-pay

## 2020-04-05 DIAGNOSIS — Z7901 Long term (current) use of anticoagulants: Secondary | ICD-10-CM

## 2020-04-05 DIAGNOSIS — I4891 Unspecified atrial fibrillation: Secondary | ICD-10-CM

## 2020-04-05 LAB — POCT INR: INR: 2.9 (ref 2.0–3.0)

## 2020-04-05 NOTE — Patient Instructions (Addendum)
Pre visit review using our clinic review tool, if applicable. No additional management support is needed unless otherwise documented below in the visit note.  Continue to take 5 mg on Sun, Tues, Thurs and take 2.5 mg on Mon, Friday and Saturdays and nothing on Wednesdays.  Re-check in 6 weeks.

## 2020-04-07 ENCOUNTER — Telehealth: Payer: Self-pay | Admitting: Family Medicine

## 2020-04-07 NOTE — Progress Notes (Signed)
  Chronic Care Management   Note  04/07/2020 Name: Justin Gallegos MRN: [id number] DOB: 1929-07-08  Justin Gallegos is a 86 y.o. year old male who is a primary care patient of Laurey Morale, MD. I reached out to Bevelyn Ngo by phone today in response to a referral sent by Justin Gallegos's PCP, Laurey Morale, MD.   Justin Gallegos was given information about Chronic Care Management services today including:  1. CCM service includes personalized support from designated clinical staff supervised by his physician, including individualized plan of care and coordination with other care providers 2. 24/7 contact phone numbers for assistance for urgent and routine care needs. 3. Service will only be billed when office clinical staff spend 20 minutes or more in a month to coordinate care. 4. Only one practitioner may furnish and bill the service in a calendar month. 5. The patient may stop CCM services at any time (effective at the end of the month) by phone call to the office staff.   Patient agreed to services and verbal consent obtained.   Follow up plan:   Carley Perdue [employer] Scheduler

## 2020-04-10 ENCOUNTER — Other Ambulatory Visit: Payer: Self-pay

## 2020-04-10 ENCOUNTER — Ambulatory Visit (HOSPITAL_COMMUNITY): Payer: PPO | Attending: Cardiology

## 2020-04-10 DIAGNOSIS — I4891 Unspecified atrial fibrillation: Secondary | ICD-10-CM

## 2020-04-10 LAB — ECHOCARDIOGRAM COMPLETE
AR max vel: 3.32 cm2
AV Area VTI: 3.21 cm2
AV Area mean vel: 3.2 cm2
AV Mean grad: 1 mmHg
AV Peak grad: 1.4 mmHg
Ao pk vel: 0.59 m/s
Area-P 1/2: 4.06 cm2
S' Lateral: 2.7 cm

## 2020-04-11 ENCOUNTER — Other Ambulatory Visit: Payer: Self-pay | Admitting: Family Medicine

## 2020-04-11 DIAGNOSIS — Z7901 Long term (current) use of anticoagulants: Secondary | ICD-10-CM

## 2020-04-18 ENCOUNTER — Ambulatory Visit: Payer: PPO | Admitting: Pharmacist

## 2020-04-18 DIAGNOSIS — I4891 Unspecified atrial fibrillation: Secondary | ICD-10-CM

## 2020-04-18 DIAGNOSIS — I10 Essential (primary) hypertension: Secondary | ICD-10-CM

## 2020-04-18 NOTE — Chronic Care Management (AMB) (Signed)
Chronic Care Management Pharmacy  Name: KERIC ZEHREN  MRN: [id number] DOB: September 22, 1928  Initial Questions: 1. Have you seen any other providers since your last visit? n/a 2. Any changes in your medicines or health? No   Chief Complaint/ HPI  Justin Gallegos,  86 y.o. , male presents for their Initial CCM visit with the clinical pharmacist via telephone due to COVID-19 Pandemic.  PCP : Laurey Morale, MD  Their chronic conditions include: A fib, HTN, HLD, Vit B12 deficiency, allergic rhinitis, retinal vein occlusion  Office Visits: -02/09/20 [name]: Patient presented for increased swelling in the feet and ankles without pain or SOB. Started furosemide 20 mg each morning.  -12/30/19 Dr Sarajane Jews: Patient presented for annual exam. Discussed diet and exercise. Encouraged cardiology follow up with Dr. Alan Ripper office.  -12/13/19 [name]: Patient presented for swelling and rash on left foot. Started ketoconazole x 30 days for tinea pedis.  Consult Visit: -04/05/20 Meriam Sprague, RN (cardiology): Presented for INR check for a fib diagnosis. INR 2.9, goal 2.5-3.0. Warfarin 0 mg on Wed, 2.5 mg on Mon, Fri and Sat and 5 mg on all other days. Follow up in 6 weeks.  -03/27/20 Dorris Carnes, MD (Cardiology): Patient presented for A fib follow up. Echo ordered given mild edema. Continue medications. Follow up in 6 months.  -01/05/20 Sherlynn Stalls (ophthalmology): Patient presented for vitreous degeneration and retinal artery branch occlusion follow up. Unable to view notes.  -12/17/19 Gertie Baron (urology): Patient presented for follow up for malignant neoplasm of bladder. Unable to view notes.  -10/26/19 Celesta Gentile DPM (podiatry): Patient presented for a rash on the side of his foot with occasional itching (tinea pedis). Started Lotrisone cream. Follow up in 3 months.  Medications: Outpatient Encounter Medications as of 04/18/2020  Medication Sig  . Aflibercept (EYLEA IO) Inject into the eye.  [name]  atorvastatin (LIPITOR) 40 MG tablet TAKE 1 TABLET BY MOUTH ONCE DAILY . APPOINTMENT REQUIRED FOR FUTURE REFILLS  . Cholecalciferol (VITAMIN D) 125 MCG (5000 UT) CAPS Take 5,000 Units by mouth daily.   [name] ezetimibe (ZETIA) 10 MG tablet Take 1 tablet by mouth once daily  . Fenofibrate 150 MG CAPS Take 1 capsule by mouth once daily  . fluticasone (FLONASE) 50 MCG/ACT nasal spray USE TWO SPRAYS IN EACH NOSTRIL AS NEEDED (Patient taking differently: Place 1 spray into both nostrils daily. )  . furosemide (LASIX) 20 MG tablet Take 1 tablet (20 mg total) by mouth every other day.  . metoprolol succinate (TOPROL-XL) 25 MG 24 hr tablet TAKE 1 TABLET BY MOUTH ONCE DAILY . APPOINTMENT REQUIRED FOR FUTURE REFILLS  . tobramycin (TOBREX) 0.3 % ophthalmic solution 1 drop.   [name] warfarin (COUMADIN) 2.5 MG tablet TAKE 1 TABLET BY MOUTH ON MONDAY, FRIDAY, AND SATURDAY OR AS DIRECTED BY THE ANTICOAGULATION CLINIC  . warfarin (COUMADIN) 5 MG tablet TAKE 1 TABLET BY MOUTH ONCE DAILY ON SUNDAYS, TUESDAYS, AND THURSDAYS. OR AS DIRECTED BY ANTICOAGULATION CLINIC   No facility-administered encounter medications on file as of 04/18/2020.   Patient reports eye sight is getting worse.  Patient reports living in [place] for 65 years. He lives with his wife and because he is limited by his eyesight she is responsible for the driving, cooking and cleaning. Pt reports he does helps when he can. He has 3 children living closer, one son lives in [place], one son in [place], and a daughter in [place] and then he has children in [place]  and [place]. He talks to all of them on the phone and sees his local family more often.   He reports spending a lot of time watching TV and he sleeps 10 hours per night. He used to read a lot but is limited with his vision. He reports his son in law takes care of a garden at his house and gets to eat brocolli, cauliflower, collards, tomatoes, green beans, and squash from the garden.  Patient reports  him and his wife eat a lot of chicken and most days get take out from a restaurant next door to their house.  Pt reports going to a physical trainer twice a week and has one on one classes.  Current Diagnosis/Assessment:  Goals Addressed            This Visit's Progress   . Pharmacy care plan       CARE PLAN ENTRY (see longitudinal plan of care for additional care plan information)  Current Barriers:  . Chronic Disease Management support, education, and care coordination needs related to Hypertension, Hyperlipidemia, Atrial Fibrillation, Allergic Rhinitis, and vitamin B 12 deficiency   Hypertension BP Readings from Last 3 Encounters:  03/27/20 108/78  02/09/20 130/70  12/30/19 122/68   . Pharmacist Clinical Goal(s): o Over the next 120 days, patient will work with PharmD and providers to maintain BP goal <140/90 . Current regimen:  . Metoprolol succinate 25 mg 1 tablet daily . Furosemide 20 mg 1 tablet every other day (or every 2-3 days)  . Interventions: o We discussed the importance of monitoring blood pressure at home and signs of low blood pressure (dizziness/lightheadedness) . Patient self care activities - Over the next 120 days, patient will: o Check blood pressure at least weekly, document, and provide at future appointments o Ensure daily salt intake < 2300 mg/day  Hyperlipidemia Lab Results  Component Value Date/Time   LDLCALC 70 11/21/2017 10:40 AM   LDLDIRECT 97.0 12/30/2019 11:12 AM   . Pharmacist Clinical Goal(s): o Over the next *120 days, patient will work with PharmD and providers to maintain LDL goal < 70 and triglyceride goal of < 150 . Current regimen:  . Atorvastatin 40 mg 1 tablet daily   . Fenofibrate 150 mg 1 capsule daily  . Ezetimibe 10 mg 1 tablet daily  . Interventions: o We discussed eating foods low in saturated fats and sugars  . Patient self care activities - Over the next 120 days, patient will: o Continue taking medications as  prescribed o Work on limiting sugar intake to help lower triglycerides  A fib . Pharmacist Clinical Goal(s) o Over the next 120 days, patient will work with PharmD and providers to maintain heart rate between 60 - 100 beats per minute . Current regimen:  . Metoprolol succinate 25 mg 1 tablet daily . Warfarin 0 mg on Wed, 2.5 mg on Mon, Fri and Sat and 5 mg on all other days . Interventions: o We discussed monitoring for signs of bleeding such as nosebleeds, blood in urine or stool, and bruising with no apparent cause o We discussed the importance of monitoring your heart rate . Patient self care activities - Over the next 120 days, patient will: o Check and record heart rate along with blood pressure o Continue checking for signs of bleeding  Allergic rhinitis . Pharmacist Clinical Goal(s) o Over the next 120 days, patient will work with PharmD and providers to minimize symptoms of allergies . Current regimen:  o Flonase 50  mcg/act 2 sprays in each nostril as needed . Patient self care activities - Over the next 120 days, patient will: o Continue current medication  Medication management . Pharmacist Clinical Goal(s): o Over the next 120 days, patient will work with PharmD and providers to maintain optimal medication adherence . Current pharmacy: [facility] . Interventions o Comprehensive medication review performed. o Continue current medication management strategy . Patient self care activities - Over the next 120 days, patient will: o Focus on medication adherence o Take medications as prescribed o Report any questions or concerns to PharmD and/or provider(s)  Initial goal documentation       SDOH Interventions     Most Recent Value  SDOH Interventions  Financial Strain Interventions Intervention Not Indicated  Transportation Interventions Intervention Not Indicated      AFIB   Patient is currently rate controlled. Office heart rates are  Pulse Readings from Last 3  Encounters:  03/27/20 (!) 107  02/09/20 93  12/30/19 73    CHA2DS2-VASc Score =   5  The patient's score is based upon: age, HTN, TIA   Patient has failed these meds in past: none Patient is currently controlled on the following medications:  [name] Metoprolol succinate 25 mg 1 tablet daily . Furosemide 20 mg 1 tablet every other day . Warfarin 0 mg on Wed, 2.5 mg on Mon, Fri and Sat and 5 mg on all other days (INR checked by cardiology RN)  We discussed:  the importance of monitoring heart rate with A fib; monitoring for signs of fluid retention; signs of bleeding/bruising   Plan  Continue current medications     Hypertension   BP goal is:  <140/90  Office blood pressures are  BP Readings from Last 3 Encounters:  03/27/20 108/78  02/09/20 130/70  12/30/19 122/68   Patient checks BP at home several times per month Patient home BP readings are ranging: patient reports all WNL but was unable to provide exact numbers  Patient has failed these meds in the past: none Patient is currently controlled on the following medications:  [name] Metoprolol succinate 25 mg 1 tablet daily . Furosemide 20 mg 1 tablet every other day (or every 2-3 days)   We discussed signs and symptoms of low and high blood pressure  Plan Continue monitoring blood pressure at home and keep a log of all readings. Continue current medications     Hyperlipidemia   LDL goal < 70  Lipid Panel     Component Value Date/Time   CHOL 178 12/30/2019 1112   CHOL 134 11/21/2017 1040   TRIG 312.0 (H) 12/30/2019 1112   TRIG 178 (H) 07/24/2006 0829   HDL 35.30 (L) 12/30/2019 1112   HDL 28 (L) 11/21/2017 1040   LDLCALC 70 11/21/2017 1040   LDLDIRECT 97.0 12/30/2019 1112    Hepatic Function Latest Ref Rng & Units 12/30/2019 05/29/2018 08/21/2017  Total Protein 6.0 - 8.3 g/dL 6.1 6.2 6.1  Albumin 3.5 - 5.2 g/dL 3.9 3.9 3.7  AST 0 - 37 U/L '22 21 22  ' ALT 0 - 53 U/L '16 18 15  ' Alk Phosphatase 39 - 117 U/L 61 65 56   Total Bilirubin 0.2 - 1.2 mg/dL 0.7 0.7 0.9  Bilirubin, Direct 0.0 - 0.3 mg/dL 0.1 0.2 0.2     The ASCVD Risk score (Norris., et al., 2013) failed to calculate for the following reasons:   The 2013 ASCVD risk score is only valid for ages 53 to  59   The patient has a prior MI or stroke diagnosis   Patient has failed these meds in past: rosuvastatin (unknown), simvastatin (needed higher intensity) Patient is currently uncontrolled on the following medications:  . Atorvastatin 40 mg 1 tablet daily   . Fenofibrate 150 mg 1 capsule daily  . Ezetimibe 10 mg 1 tablet daily   We discussed:  diet and exercise extensively  Plan Consider increasing atorvastatin to 80 mg or switching to rosuvastatin for additional TG lowering. Patient will work on diet and reducing sugar intake. Continue current medications and control with diet and exercise  Allergic rhinitis   Patient is currently controlled on the following medications:  . Flonase 50 mcg/act 2 sprays in each nostril as needed  We discussed: the need for Flonase and patients reports using very infrequently   Plan  Continue current medications   Retinal vein occlusion   Patient is currently controlled on the following medications:  Alfonse Flavors injections every 6 - 8 weeks . Tobramycin 0.3% instill 1 drop into the right eye 4 times daily prior to procedure     Plan  Continue current medications   Vitamin B12 deficiency   Patient is currently not taking any medications.  We discussed:  Benefit of vitamin B12 supplementation if true deficiency  Plan Recommend repeat vit B12 level to ensure adequate supplementation.  Continue current management.  Miscellanous   Last vitamin D Lab Results  Component Value Date   VD25OH 35.24 05/29/2018    Patient is currently controlled (according to 2019 vit D level) on the following medications:  [name] Vitamin D 5000 units 1 capsule daily  We discussed:  Benefits of taking vitamin D  and the risks of oversupplementation  Plan Recommend repeat vitamin D level Continue current medications  Vaccines   Reviewed and discussed patient's vaccination history.    Immunization History  Administered Date(s) Administered  . Fluad Quad(high Dose 65+) 05/24/2019  . H1N1 07/21/2008  . Influenza Split 06/18/2011  . Influenza Whole 05/13/2007, 05/10/2008, 05/16/2009, 05/01/2010  . Influenza, High Dose Seasonal PF 05/10/2015, 04/17/2016, 05/05/2017, 05/13/2018  . Influenza,inj,Quad PF,6+ Mos 05/27/2013, 05/11/2014  . Pneumococcal Conjugate-13 05/11/2014  . Pneumococcal Polysaccharide-23 05/12/2005  . Td 11/02/2007  . Zoster 07/09/2006   Patient reported getting [place] vaccine on Feb 22nd & March 15th.  Plan  Recommended patient receive Shingrix and tetanus vaccine at pharmacy or in office.  Medication Management   Pt uses [facility] for all medications Uses pill box? Yes - weekly pill box  Pt endorses 95% compliance   We discussed: Switching to [facility]  Plan  Continue current medication management strategy   Follow up: 4 month phone visit  Jeni Salles, PharmD Clinical Pharmacist [place] [facility] [phone]

## 2020-04-20 NOTE — Patient Instructions (Addendum)
Visit Information  Goals Addressed            This Visit's Progress   . Pharmacy care plan       CARE PLAN ENTRY (see longitudinal plan of care for additional care plan information)  Current Barriers:  . Chronic Disease Management support, education, and care coordination needs related to Hypertension, Hyperlipidemia, Atrial Fibrillation, Allergic Rhinitis, and vitamin B 12 deficiency   Hypertension BP Readings from Last 3 Encounters:  03/27/20 108/78  02/09/20 130/70  12/30/19 122/68   . Pharmacist Clinical Goal(s): o Over the next 120 days, patient will work with PharmD and providers to maintain BP goal <140/90 . Current regimen:  . Metoprolol succinate 25 mg 1 tablet daily . Furosemide 20 mg 1 tablet every other day (or every 2-3 days)  . Interventions: o We discussed the importance of monitoring blood pressure at home and signs of low blood pressure (dizziness/lightheadedness) . Patient self care activities - Over the next 120 days, patient will: o Check blood pressure at least weekly, document, and provide at future appointments o Ensure daily salt intake < 2300 mg/day  Hyperlipidemia Lab Results  Component Value Date/Time   LDLCALC 70 11/21/2017 10:40 AM   LDLDIRECT 97.0 12/30/2019 11:12 AM   . Pharmacist Clinical Goal(s): o Over the next *120 days, patient will work with PharmD and providers to maintain LDL goal < 70 and triglyceride goal of < 150 . Current regimen:  . Atorvastatin 40 mg 1 tablet daily   . Fenofibrate 150 mg 1 capsule daily  . Ezetimibe 10 mg 1 tablet daily  . Interventions: o We discussed eating foods low in saturated fats and sugars  . Patient self care activities - Over the next 120 days, patient will: o Continue taking medications as prescribed o Work on limiting sugar intake to help lower triglycerides  A fib . Pharmacist Clinical Goal(s) o Over the next 120 days, patient will work with PharmD and providers to maintain heart rate  between 60 - 100 beats per minute . Current regimen:  . Metoprolol succinate 25 mg 1 tablet daily . Warfarin 0 mg on Wed, 2.5 mg on Mon, Fri and Sat and 5 mg on all other days . Interventions: o We discussed monitoring for signs of bleeding such as nosebleeds, blood in urine or stool, and bruising with no apparent cause o We discussed the importance of monitoring your heart rate . Patient self care activities - Over the next 120 days, patient will: o Check and record heart rate along with blood pressure o Continue checking for signs of bleeding  Allergic rhinitis . Pharmacist Clinical Goal(s) o Over the next 120 days, patient will work with PharmD and providers to minimize symptoms of allergies . Current regimen:  o Flonase 50 mcg/act 2 sprays in each nostril as needed . Patient self care activities - Over the next 120 days, patient will: o Continue current medication  Medication management . Pharmacist Clinical Goal(s): o Over the next 120 days, patient will work with PharmD and providers to maintain optimal medication adherence . Current pharmacy: [facility] . Interventions o Comprehensive medication review performed. o Continue current medication management strategy . Patient self care activities - Over the next 120 days, patient will: o Focus on medication adherence o Take medications as prescribed o Report any questions or concerns to PharmD and/or provider(s)  Initial goal documentation        Justin Gallegos was given information about Chronic Care Management services today  including:  1. CCM service includes personalized support from designated clinical staff supervised by his physician, including individualized plan of care and coordination with other care providers 2. 24/7 contact phone numbers for assistance for urgent and routine care needs. 3. Standard insurance, coinsurance, copays and deductibles apply for chronic care management only during months in which we provide at  least 20 minutes of these services. Most insurances cover these services at 100%, however patients may be responsible for any copay, coinsurance and/or deductible if applicable. This service may help you avoid the need for more expensive face-to-face services. 4. Only one practitioner may furnish and bill the service in a calendar month. 5. The patient may stop CCM services at any time (effective at the end of the month) by phone call to the office staff.  Patient agreed to services and verbal consent obtained.   The patient verbalized understanding of instructions provided today and agreed to receive a mailed copy of patient instruction and/or educational materials. Telephone follow up appointment with pharmacy team member scheduled for: 4 months  Jeni Salles, PharmD Clinical Pharmacist [place] [facility]    High Triglycerides Eating Plan Triglycerides are a type of fat in the blood. High levels of triglycerides can increase your risk of heart disease and stroke. If your triglyceride levels are high, choosing the right foods can help lower your triglycerides and keep your heart healthy. Work with your health care provider or a diet and nutrition specialist (dietitian) to develop an eating plan that is right for you. What are tips for following this plan? General guidelines   Lose weight, if you are overweight. For most people, losing 5-10 lbs (2-5 kg) helps lower triglyceride levels. A weight-loss plan may include. ? 30 minutes of exercise at least 5 days a week. ? Reducing the amount of calories, sugar, and fat you eat.  Eat a wide variety of fresh fruits, vegetables, and whole grains. These foods are high in fiber.  Eat foods that contain healthy fats, such as fatty fish, nuts, seeds, and olive oil.  Avoid foods that are high in added sugar, added salt (sodium), saturated fat, and trans fat.  Avoid low-fiber, refined carbohydrates such as white bread,  crackers, noodles, and white rice.  Avoid foods with partially hydrogenated oils (trans fats), such as fried foods or stick margarine.  Limit alcohol intake to no more than 1 drink a day for nonpregnant women and 2 drinks a day for men. One drink equals 12 oz of beer, 5 oz of wine, or 1 oz of hard liquor. Your health care provider may recommend that you drink less depending on your overall health. Reading food labels  Check food labels for the amount of saturated fat. Choose foods with no or very little saturated fat.  Check food labels for the amount of trans fat. Choose foods with no trans fat.  Check food labels for the amount of cholesterol. Choose foods low in cholesterol. Ask your dietitian how much cholesterol you should have each day.  Check food labels for the amount of sodium. Choose foods with less than 140 milligrams (mg) per serving. Shopping  Buy dairy products labeled as nonfat (skim) or low-fat (1%).  Avoid buying processed or prepackaged foods. These are often high in added sugar, sodium, and fat. Cooking  Choose healthy fats when cooking, such as olive oil or canola oil.  Cook foods using lower fat methods, such as baking, broiling, boiling, or grilling.  Make your own  sauces, dressings, and marinades when possible, instead of buying them. Store-bought sauces, dressings, and marinades are often high in sodium and sugar. Meal planning  Eat more home-cooked food and less restaurant, buffet, and fast food.  Eat fatty fish at least 2 times each week. Examples of fatty fish include salmon, trout, mackerel, tuna, and herring.  If you eat whole eggs, do not eat more than 3 egg yolks per week. What foods are recommended? The items listed may not be a complete list. Talk with your dietitian about what dietary choices are best for you. Grains Whole wheat or whole grain breads, crackers, cereals, and pasta. Unsweetened oatmeal. Bulgur. Barley. Quinoa. [place] rice. Whole  wheat flour tortillas. Vegetables Fresh or frozen vegetables. Low-sodium canned vegetables. Fruits All fresh, canned (in natural juice), or frozen fruits. Meats and other protein foods Skinless chicken or [place]. Ground chicken or [place]. Lean cuts of pork, trimmed of fat. Fish and seafood, especially salmon, trout, and herring. Egg whites. Dried beans, peas, or lentils. Unsalted nuts or seeds. Unsalted canned beans. Natural peanut or almond butter. Dairy Low-fat dairy products. Skim or low-fat (1%) milk. Reduced fat (2%) and low-sodium cheese. Low-fat ricotta cheese. Low-fat cottage cheese. Plain, low-fat yogurt. Fats and oils Tub margarine without trans fats. Light or reduced-fat mayonnaise. Light or reduced-fat salad dressings. Avocado. Safflower, olive, sunflower, soybean, and canola oils. What foods are not recommended? The items listed may not be a complete list. Talk with your dietitian about what dietary choices are best for you. Grains White bread. White (regular) pasta. White rice. Cornbread. Bagels. Pastries. Crackers that contain trans fat. Vegetables Creamed or fried vegetables. Vegetables in a cheese sauce. Fruits Sweetened dried fruit. Canned fruit in syrup. Fruit juice. Meats and other protein foods Fatty cuts of meat. Ribs. Chicken wings. Berniece Salines. Sausage. Bologna. Salami. Chitterlings. Fatback. Hot dogs. Bratwurst. Packaged lunch meats. Dairy Whole or reduced-fat (2%) milk. Half-and-half. Cream cheese. Full-fat or sweetened yogurt. Full-fat cheese. Nondairy creamers. Whipped toppings. Processed cheese or cheese spreads. Cheese curds. Beverages Alcohol. Sweetened drinks, such as soda, lemonade, fruit drinks, or punches. Fats and oils Butter. Stick margarine. Lard. Shortening. Ghee. Bacon fat. Tropical oils, such as coconut, palm kernel, or palm oils. Sweets and desserts Corn syrup. Sugars. Honey. Molasses. Candy. Jam and jelly. Syrup. Sweetened cereals. Cookies. Pies.  Cakes. Donuts. Muffins. Ice cream. Condiments Store-bought sauces, dressings, and marinades that are high in sugar, such as ketchup and barbecue sauce. Summary  High levels of triglycerides can increase the risk of heart disease and stroke. Choosing the right foods can help lower your triglycerides.  Eat plenty of fresh fruits, vegetables, and whole grains. Choose low-fat dairy and lean meats. Eat fatty fish at least twice a week.  Avoid processed and prepackaged foods with added sugar, sodium, saturated fat, and trans fat.  If you need suggestions or have questions about what types of food are good for you, talk with your health care provider or a dietitian. This information is not intended to replace advice given to you by your health care provider. Make sure you discuss any questions you have with your health care provider. Document Revised: 07/11/2017 Document Reviewed: 10/01/2016 Elsevier Patient Education  2020 [employer].

## 2020-05-10 ENCOUNTER — Ambulatory Visit: Payer: PPO

## 2020-05-10 DIAGNOSIS — H3582 Retinal ischemia: Secondary | ICD-10-CM | POA: Diagnosis not present

## 2020-05-10 DIAGNOSIS — H34833 Tributary (branch) retinal vein occlusion, bilateral, with macular edema: Secondary | ICD-10-CM | POA: Diagnosis not present

## 2020-05-10 DIAGNOSIS — H43813 Vitreous degeneration, bilateral: Secondary | ICD-10-CM | POA: Diagnosis not present

## 2020-05-10 DIAGNOSIS — H3563 Retinal hemorrhage, bilateral: Secondary | ICD-10-CM | POA: Diagnosis not present

## 2020-05-15 ENCOUNTER — Ambulatory Visit (INDEPENDENT_AMBULATORY_CARE_PROVIDER_SITE_OTHER): Payer: PPO | Admitting: General Practice

## 2020-05-15 ENCOUNTER — Other Ambulatory Visit: Payer: Self-pay | Admitting: General Practice

## 2020-05-15 ENCOUNTER — Other Ambulatory Visit: Payer: Self-pay

## 2020-05-15 DIAGNOSIS — Z23 Encounter for immunization: Secondary | ICD-10-CM

## 2020-05-15 DIAGNOSIS — I4891 Unspecified atrial fibrillation: Secondary | ICD-10-CM | POA: Diagnosis not present

## 2020-05-15 DIAGNOSIS — Z7901 Long term (current) use of anticoagulants: Secondary | ICD-10-CM

## 2020-05-15 LAB — POCT INR: INR: 3.7 — AB (ref 2.0–3.0)

## 2020-05-15 NOTE — Patient Instructions (Addendum)
Pre visit review using our clinic review tool, if applicable. No additional management support is needed unless otherwise documented below in the visit note.  Continue to take 5 mg on Sun, Tues, Thurs and take 2.5 mg on Mon, Friday and Saturdays and nothing on Wednesdays.  Re-check in 6 weeks.

## 2020-05-22 ENCOUNTER — Other Ambulatory Visit: Payer: Self-pay | Admitting: Family Medicine

## 2020-05-22 DIAGNOSIS — E782 Mixed hyperlipidemia: Secondary | ICD-10-CM

## 2020-06-26 ENCOUNTER — Ambulatory Visit (INDEPENDENT_AMBULATORY_CARE_PROVIDER_SITE_OTHER): Payer: PPO | Admitting: General Practice

## 2020-06-26 ENCOUNTER — Other Ambulatory Visit: Payer: Self-pay

## 2020-06-26 ENCOUNTER — Ambulatory Visit (INDEPENDENT_AMBULATORY_CARE_PROVIDER_SITE_OTHER): Payer: PPO

## 2020-06-26 DIAGNOSIS — I4891 Unspecified atrial fibrillation: Secondary | ICD-10-CM

## 2020-06-26 DIAGNOSIS — Z0000 Encounter for general adult medical examination without abnormal findings: Secondary | ICD-10-CM

## 2020-06-26 DIAGNOSIS — Z7901 Long term (current) use of anticoagulants: Secondary | ICD-10-CM

## 2020-06-26 LAB — POCT INR: INR: 3.3 — AB (ref 2.0–3.0)

## 2020-06-26 NOTE — Patient Instructions (Signed)
Mr. Shi , Thank you for taking time to come for your Medicare Wellness Visit. I appreciate your ongoing commitment to your health goals. Please review the following plan we discussed and let me know if I can assist you in the future.   Screening recommendations/referrals: Colonoscopy: No longer required  Recommended yearly ophthalmology/optometry visit for glaucoma screening and checkup Recommended yearly dental visit for hygiene and checkup  Vaccinations: Influenza vaccine: Up to date. Next due fall 2022  Pneumococcal vaccine: Completed series  Tdap vaccine: Currently due, you may receive at your next in person office visit  Shingles vaccine: Currently due for Shingrix, we recommend if you wish to receive that you do so at your local pharmacy as it will be more cost effective  Advanced directives: Advance directive discussed with you today. Even though you declined this today please call our office should you change your mind and we can give you the proper paperwork for you to fill out.   Conditions/risks identified: None   Next appointment: 08/16/2020 @ 10:00 am with pharmacist via telephone   Preventive Care 65 Years and Older, Male Preventive care refers to lifestyle choices and visits with your health care provider that can promote health and wellness. What does preventive care include?  A yearly physical exam. This is also called an annual well check.  Dental exams once or twice a year.  Routine eye exams. Ask your health care provider how often you should have your eyes checked.  Personal lifestyle choices, including:  Daily care of your teeth and gums.  Regular physical activity.  Eating a healthy diet.  Avoiding tobacco and drug use.  Limiting alcohol use.  Practicing safe sex.  Taking low doses of aspirin every day.  Taking vitamin and mineral supplements as recommended by your health care provider. What happens during an annual well check? The services  and screenings done by your health care provider during your annual well check will depend on your age, overall health, lifestyle risk factors, and family history of disease. Counseling  Your health care provider may ask you questions about your:  Alcohol use.  Tobacco use.  Drug use.  Emotional well-being.  Home and relationship well-being.  Sexual activity.  Eating habits.  History of falls.  Memory and ability to understand (cognition).  Work and work Statistician. Screening  You may have the following tests or measurements:  Height, weight, and BMI.  Blood pressure.  Lipid and cholesterol levels. These may be checked every 5 years, or more frequently if you are over 46 years old.  Skin check.  Lung cancer screening. You may have this screening every year starting at age 62 if you have a 30-pack-year history of smoking and currently smoke or have quit within the past 15 years.  Fecal occult blood test (FOBT) of the stool. You may have this test every year starting at age 24.  Flexible sigmoidoscopy or colonoscopy. You may have a sigmoidoscopy every 5 years or a colonoscopy every 10 years starting at age 9.  Prostate cancer screening. Recommendations will vary depending on your family history and other risks.  Hepatitis C blood test.  Hepatitis B blood test.  Sexually transmitted disease (STD) testing.  Diabetes screening. This is done by checking your blood sugar (glucose) after you have not eaten for a while (fasting). You may have this done every 1-3 years.  Abdominal aortic aneurysm (AAA) screening. You may need this if you are a current or former smoker.  Osteoporosis.  You may be screened starting at age 86 if you are at high risk. Talk with your health care provider about your test results, treatment options, and if necessary, the need for more tests. Vaccines  Your health care provider may recommend certain vaccines, such as:  Influenza vaccine. This  is recommended every year.  Tetanus, diphtheria, and acellular pertussis (Tdap, Td) vaccine. You may need a Td booster every 10 years.  Zoster vaccine. You may need this after age 20.  Pneumococcal 13-valent conjugate (PCV13) vaccine. One dose is recommended after age 67.  Pneumococcal polysaccharide (PPSV23) vaccine. One dose is recommended after age 33. Talk to your health care provider about which screenings and vaccines you need and how often you need them. This information is not intended to replace advice given to you by your health care provider. Make sure you discuss any questions you have with your health care provider. Document Released: 08/25/2015 Document Revised: 04/17/2016 Document Reviewed: 05/30/2015 Elsevier Interactive Patient Education  2017 [place] Prevention in the Home Falls can cause injuries. They can happen to people of all ages. There are many things you can do to make your home safe and to help prevent falls. What can I do on the outside of my home?  Regularly fix the edges of walkways and driveways and fix any cracks.  Remove anything that might make you trip as you walk through a door, such as a raised step or threshold.  Trim any bushes or trees on the path to your home.  Use bright outdoor lighting.  Clear any walking paths of anything that might make someone trip, such as rocks or tools.  Regularly check to see if handrails are loose or broken. Make sure that both sides of any steps have handrails.  Any raised decks and porches should have guardrails on the edges.  Have any leaves, snow, or ice cleared regularly.  Use sand or salt on walking paths during winter.  Clean up any spills in your garage right away. This includes oil or grease spills. What can I do in the bathroom?  Use night lights.  Install grab bars by the toilet and in the tub and shower. Do not use towel bars as grab bars.  Use non-skid mats or decals in the tub or  shower.  If you need to sit down in the shower, use a plastic, non-slip stool.  Keep the floor dry. Clean up any water that spills on the floor as soon as it happens.  Remove soap buildup in the tub or shower regularly.  Attach bath mats securely with double-sided non-slip rug tape.  Do not have throw rugs and other things on the floor that can make you trip. What can I do in the bedroom?  Use night lights.  Make sure that you have a light by your bed that is easy to reach.  Do not use any sheets or blankets that are too big for your bed. They should not hang down onto the floor.  Have a firm chair that has side arms. You can use this for support while you get dressed.  Do not have throw rugs and other things on the floor that can make you trip. What can I do in the kitchen?  Clean up any spills right away.  Avoid walking on wet floors.  Keep items that you use a lot in easy-to-reach places.  If you need to reach something above you, use a strong step stool  that has a grab bar.  Keep electrical cords out of the way.  Do not use floor polish or wax that makes floors slippery. If you must use wax, use non-skid floor wax.  Do not have throw rugs and other things on the floor that can make you trip. What can I do with my stairs?  Do not leave any items on the stairs.  Make sure that there are handrails on both sides of the stairs and use them. Fix handrails that are broken or loose. Make sure that handrails are as long as the stairways.  Check any carpeting to make sure that it is firmly attached to the stairs. Fix any carpet that is loose or worn.  Avoid having throw rugs at the top or bottom of the stairs. If you do have throw rugs, attach them to the floor with carpet tape.  Make sure that you have a light switch at the top of the stairs and the bottom of the stairs. If you do not have them, ask someone to add them for you. What else can I do to help prevent  falls?  Wear shoes that:  Do not have high heels.  Have rubber bottoms.  Are comfortable and fit you well.  Are closed at the toe. Do not wear sandals.  If you use a stepladder:  Make sure that it is fully opened. Do not climb a closed stepladder.  Make sure that both sides of the stepladder are locked into place.  Ask someone to hold it for you, if possible.  Clearly mark and make sure that you can see:  Any grab bars or handrails.  First and last steps.  Where the edge of each step is.  Use tools that help you move around (mobility aids) if they are needed. These include:  Canes.  Walkers.  Scooters.  Crutches.  Turn on the lights when you go into a dark area. Replace any light bulbs as soon as they burn out.  Set up your furniture so you have a clear path. Avoid moving your furniture around.  If any of your floors are uneven, fix them.  If there are any pets around you, be aware of where they are.  Review your medicines with your doctor. Some medicines can make you feel dizzy. This can increase your chance of falling. Ask your doctor what other things that you can do to help prevent falls. This information is not intended to replace advice given to you by your health care provider. Make sure you discuss any questions you have with your health care provider. Document Released: 05/25/2009 Document Revised: 01/04/2016 Document Reviewed: 09/02/2014 Elsevier Interactive Patient Education  2017 [employer].

## 2020-06-26 NOTE — Progress Notes (Addendum)
Subjective:   Justin Gallegos is a 86 y.o. male who presents for Medicare Annual/Subsequent preventive examination.  I connected with Justin Gallegos today by telephone and verified that I am speaking with the correct person using two identifiers. Location patient: home Location provider: work Persons participating in the virtual visit: patient, provider.   I discussed the limitations, risks, security and privacy concerns of performing an evaluation and management service by telephone and the availability of in person appointments. I also discussed with the patient that there may be a patient responsible charge related to this service. The patient expressed understanding and verbally consented to this telephonic visit.    Interactive audio and video telecommunications were attempted between this provider and patient, however failed, due to patient having technical difficulties OR patient did not have access to video capability.  We continued and completed visit with audio only.    Review of Systems    N/A  Cardiac Risk Factors include: advanced age (>43men, >38 women);hypertension;dyslipidemia;male gender     Objective:    Today's Vitals   There is no height or weight on file to calculate BMI.  Advanced Directives 06/26/2020 10/26/2018 10/08/2017  Does Patient Have a Medical Advance Directive? No No No  Would patient like information on creating a medical advance directive? No - Patient declined Yes (MAU/Ambulatory/Procedural Areas - Information given) -    Current Medications (verified) Outpatient Encounter Medications as of 06/26/2020  Medication Sig  . Aflibercept (EYLEA IO) Inject into the eye.  [name] atorvastatin (LIPITOR) 40 MG tablet TAKE 1 TABLET BY MOUTH ONCE DAILY . APPOINTMENT REQUIRED FOR FUTURE REFILLS  . Cholecalciferol (VITAMIN D) 125 MCG (5000 UT) CAPS Take 5,000 Units by mouth daily.   [name] ezetimibe (ZETIA) 10 MG tablet Take 1 tablet by mouth once daily  .  Fenofibrate 150 MG CAPS Take 1 capsule by mouth once daily  . furosemide (LASIX) 20 MG tablet Take 1 tablet (20 mg total) by mouth every other day.  . metoprolol succinate (TOPROL-XL) 25 MG 24 hr tablet TAKE 1 TABLET BY MOUTH ONCE DAILY . APPOINTMENT REQUIRED FOR FUTURE REFILLS  . tobramycin (TOBREX) 0.3 % ophthalmic solution 1 drop.   [name] warfarin (COUMADIN) 2.5 MG tablet TAKE 1 TABLET BY MOUTH ON MONDAY, FRIDAY, AND SATURDAY OR AS DIRECTED BY THE ANTICOAGULATION CLINIC  . warfarin (COUMADIN) 5 MG tablet TAKE 1 TABLET BY MOUTH ONCE DAILY ON SUNDAYS, TUESDAYS, AND THURSDAYS. OR AS DIRECTED BY ANTICOAGULATION CLINIC  . fluticasone (FLONASE) 50 MCG/ACT nasal spray USE TWO SPRAYS IN EACH NOSTRIL AS NEEDED (Patient not taking: Reported on 06/26/2020)   No facility-administered encounter medications on file as of 06/26/2020.    Allergies (verified) Penicillins and Amoxicillin-pot clavulanate   History: Past Medical History:  Diagnosis Date  . Arthritis   . Atrial fibrillation [facility])    sees [name]   . Cancer [facility])    ureteral, per Dr. Alinda Money   . Eye disease    sees [name]   . Hemorrhoids   . Hyperlipidemia   . Hypertension   . Low back pain   . TIA (transient ischemic attack)    Past Surgical History:  Procedure Laterality Date  . APPENDECTOMY    . CATARACT EXTRACTION, BILATERAL  2013   per [name]   . COLONOSCOPY  06-28-10   per [name], benign polyps and severe diverticulosis, no repeats needed   . HERNIA REPAIR    . NEPHRECTOMY    .  TONSILLECTOMY    . URETERECTOMY     Family History  Problem Relation Age of Onset  . Stroke Father   . Coronary artery disease Other        fhx  . Stroke Other        fhx   Social History   Socioeconomic History  . Marital status: Married    Spouse name: Not on file  . Number of children: 23  . Years of education: Not on file  . Highest education level: Not on file  Occupational History  . 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 Smoking status: Never Smoker  . Smokeless tobacco: Never Used  Vaping Use  . Vaping Use: Never used  Substance and Sexual Activity  . Alcohol use: No    Alcohol/week: 0.0 standard drinks  . Drug use: No  . Sexual activity: Not on file  Other Topics Concern  . Not on file  Social History Narrative   Lives with wife on one level house   Has nine children, (two local), with 24 grandchildren and 5 great grandchildren   Works with Physiological scientist 2 days/week at [employer]   Attends church with wife, who currently drives him everywhere d/t poor vision   Social Determinants of Health   Financial Resource Strain: Low Risk   . Difficulty of Paying Living Expenses: Not hard at all  Food Insecurity: No Food Insecurity  . Worried About Charity fundraiser in the Last Year: Never true  . Ran Out of Food in the Last Year: Never true  Transportation Needs: No Transportation Needs  . Lack of Transportation (Medical): No  . Lack of Transportation (Non-Medical): No  Physical Activity: Insufficiently Active  . Days of Exercise per Week: 2 days  . Minutes of Exercise per Session: 30 min  Stress: No Stress Concern Present  . Feeling of Stress : Not at all  Social Connections: Moderately Integrated  . Frequency of Communication with Friends and Family: More than three times a week  . Frequency of Social Gatherings with Friends and Family: More than three times a week  . Attends Religious Services: More than 4 times per year  . Active Member of Clubs or Organizations: No  . Attends Archivist Meetings: Never  . Marital Status: Married    Tobacco Counseling Counseling given: Not Answered   Clinical Intake:  Pre-visit preparation completed: Yes  Pain : No/denies pain     Nutritional Risks: None Diabetes: No  How often do you need to have someone help you when you read instructions, pamphlets, or  other written materials from your doctor or pharmacy?: 4 - Often (due to vision) What is the last grade level you completed in school?: College  Diabetic?No      Information entered by :: [place] of Daily Living In your present state of health, do you have any difficulty performing the following activities: 06/26/2020  Hearing? Y  Vision? Y  Difficulty concentrating or making decisions? N  Walking or climbing stairs? N  Dressing or bathing? N  Doing errands, shopping? Y  Comment Patient does not drive due to vision  Preparing Food and eating ? N  Using the Toilet? N  In the past six months, have you accidently leaked urine? Y  Comment has issues with dribbles occassionally with urgency  Do you have problems with loss of bowel control? N  Managing your Medications? N  Managing your Finances? N  Housekeeping or managing your Housekeeping? N  Some recent data might be hidden    Patient Care Team: Laurey Morale, MD as PCP - General Fay Records, MD as PCP - Cardiology (Cardiology) Sherlynn Stalls, MD as Consulting Physician (Ophthalmology) [name], [facility] as Pharmacist (Pharmacist)  Indicate any recent Medical Services you may have received from other than [facility] providers in the past year (date may be approximate).     Assessment:   This is a routine wellness examination for Raymond.  Hearing/Vision screen  Hearing Screening   125Hz  250Hz  500Hz  1000Hz  2000Hz  3000Hz  4000Hz  6000Hz  8000Hz   Right ear:           Left ear:           Vision Screening Comments: Patient states see's a specialist for vision loss   Dietary issues and exercise activities discussed: Current Exercise Habits: Structured exercise class, Type of exercise: strength training/weights;walking;stretching, Time (Minutes): 30, Frequency (Times/Week): 2, Weekly Exercise (Minutes/Week): 60, Intensity: Mild, Exercise limited by: None identified  Goals    . Exercise 150 min/wk Moderate  Activity     Goal is to be 100 and continue with exercise     . Patient Stated     "Get better, no more falls".    . Pharmacy care plan     CARE PLAN ENTRY (see longitudinal plan of care for additional care plan information)  Current Barriers:  . Chronic Disease Management support, education, and care coordination needs related to Hypertension, Hyperlipidemia, Atrial Fibrillation, Allergic Rhinitis, and vitamin B 12 deficiency   Hypertension BP Readings from Last 3 Encounters:  03/27/20 108/78  02/09/20 130/70  12/30/19 122/68   . Pharmacist Clinical Goal(s): o Over the next 120 days, patient will work with PharmD and providers to maintain BP goal <140/90 . Current regimen:  . Metoprolol succinate 25 mg 1 tablet daily . Furosemide 20 mg 1 tablet every other day (or every 2-3 days)  . Interventions: o We discussed the importance of monitoring blood pressure at home and signs of low blood pressure (dizziness/lightheadedness) . Patient self care activities - Over the next 120 days, patient will: o Check blood pressure at least weekly, document, and provide at future appointments o Ensure daily salt intake < 2300 mg/day  Hyperlipidemia Lab Results  Component Value Date/Time   LDLCALC 70 11/21/2017 10:40 AM   LDLDIRECT 97.0 12/30/2019 11:12 AM   . Pharmacist Clinical Goal(s): o Over the next *120 days, patient will work with PharmD and providers to maintain LDL goal < 70 and triglyceride goal of < 150 . Current regimen:  . Atorvastatin 40 mg 1 tablet daily   . Fenofibrate 150 mg 1 capsule daily  . Ezetimibe 10 mg 1 tablet daily  . Interventions: o We discussed eating foods low in saturated fats and sugars  . Patient self care activities - Over the next 120 days, patient will: o Continue taking medications as prescribed o Work on limiting sugar intake to help lower triglycerides  A fib . Pharmacist Clinical Goal(s) o Over the next 120 days, patient will work with PharmD  and providers to maintain heart rate between 60 - 100 beats per minute . Current regimen:  . Metoprolol succinate 25 mg 1 tablet daily . Warfarin 0 mg on Wed, 2.5 mg on Mon, Fri and Sat and 5 mg on all other days . Interventions: o We discussed monitoring for signs of bleeding  such as nosebleeds, blood in urine or stool, and bruising with no apparent cause o We discussed the importance of monitoring your heart rate . Patient self care activities - Over the next 120 days, patient will: o Check and record heart rate along with blood pressure o Continue checking for signs of bleeding  Allergic rhinitis . Pharmacist Clinical Goal(s) o Over the next 120 days, patient will work with PharmD and providers to minimize symptoms of allergies . Current regimen:  o Flonase 50 mcg/act 2 sprays in each nostril as needed . Patient self care activities - Over the next 120 days, patient will: o Continue current medication  Medication management . Pharmacist Clinical Goal(s): o Over the next 120 days, patient will work with PharmD and providers to maintain optimal medication adherence . Current pharmacy: [facility] . Interventions o Comprehensive medication review performed. o Continue current medication management strategy . Patient self care activities - Over the next 120 days, patient will: o Focus on medication adherence o Take medications as prescribed o Report any questions or concerns to PharmD and/or provider(s)  Initial goal documentation       Depression Screen PHQ 2/9 Scores 06/26/2020 10/26/2018 10/08/2017 08/25/2015 07/14/2014  PHQ - 2 Score 0 0 0 0 0  PHQ- 9 Score 0 0 - - -    Fall Risk Fall Risk  06/26/2020 10/26/2018 10/08/2017 08/25/2015 07/14/2014  Falls in the past year? 0 1 No No No  Number falls in past yr: 0 1 - - -  Injury with Fall? 0 - - - -  Risk for fall due to : Impaired balance/gait;History of fall(s) Impaired balance/gait;History of fall(s);Impaired vision;Impaired  mobility - - -  Follow up Falls evaluation completed;Falls prevention discussed Education provided;Falls prevention discussed - - -    Any stairs in or around the home? Yes  If so, are there any without handrails? No  Home free of loose throw rugs in walkways, pet beds, electrical cords, etc? Yes  Adequate lighting in your home to reduce risk of falls? Yes   ASSISTIVE DEVICES UTILIZED TO PREVENT FALLS:  Life alert? No  Use of a cane, walker or w/c? Yes  Grab bars in the bathroom? Yes  Shower chair or bench in shower? No  Elevated toilet seat or a handicapped toilet? No     Cognitive Function: Cognitive screening not indicated based on direct observation MMSE - Mini Mental State Exam 10/08/2017  Not completed: (No Data)        Immunizations Immunization History  Administered Date(s) Administered  . Fluad Quad(high Dose 65+) 05/24/2019, 05/15/2020  . H1N1 07/21/2008  . Influenza Split 06/18/2011  . Influenza Whole 05/13/2007, 05/10/2008, 05/16/2009, 05/01/2010  . Influenza, High Dose Seasonal PF 05/10/2015, 04/17/2016, 05/05/2017, 05/13/2018  . Influenza,inj,Quad PF,6+ Mos 05/27/2013, 05/11/2014  . PFIZER SARS-COV-2 Vaccination 10/04/2019, 10/25/2019, 05/25/2020  . Pneumococcal Conjugate-13 05/11/2014  . Pneumococcal Polysaccharide-23 05/12/2005  . Td 11/02/2007  . Zoster 07/09/2006    TDAP status: Due, Education has been provided regarding the importance of this vaccine. Advised may receive this vaccine at local pharmacy or Health Dept. Aware to provide a copy of the vaccination record if obtained from local pharmacy or Health Dept. Verbalized acceptance and understanding. Flu Vaccine status: Up to date Pneumococcal vaccine status: Up to date Covid-19 vaccine status: Completed vaccines  Qualifies for Shingles Vaccine? Yes   Zostavax completed Yes   Shingrix Completed?: No.    Education has been provided regarding the importance of this vaccine.  Patient has been advised  to call insurance company to determine out of pocket expense if they have not yet received this vaccine. Advised may also receive vaccine at local pharmacy or Health Dept. Verbalized acceptance and understanding.  Screening Tests Health Maintenance  Topic Date Due  . TETANUS/TDAP  11/01/2017  . INFLUENZA VACCINE  Completed  . COVID-19 Vaccine  Completed  . PNA vac Low Risk Adult  Completed    Health Maintenance  Health Maintenance Due  Topic Date Due  . TETANUS/TDAP  11/01/2017    Colorectal cancer screening: No longer required.   Lung Cancer Screening: (Low Dose CT Chest recommended if Age 15-80 years, 30 pack-year currently smoking OR have quit w/in 15years.) does not qualify.   Lung Cancer Screening Referral: N/A   Additional Screening:  Hepatitis C Screening: does not qualify;   Vision Screening: Recommended annual ophthalmology exams for early detection of glaucoma and other disorders of the eye. Is the patient up to date with their annual eye exam?  Yes  Who is the provider or what is the name of the office in which the patient attends annual eye exams? Dr.Sanders If pt is not established with a provider, would they like to be referred to a provider to establish care? No .   Dental Screening: Recommended annual dental exams for proper oral hygiene  Community Resource Referral / Chronic Care Management: CRR required this visit?  No   CCM required this visit?  No      Plan:     I have personally reviewed and noted the following in the patient's chart:   . Medical and social history . Use of alcohol, tobacco or illicit drugs  . Current medications and supplements . Functional ability and status . Nutritional status . Physical activity . Advanced directives . List of other physicians . Hospitalizations, surgeries, and ER visits in previous 12 months . Vitals . Screenings to include cognitive, depression, and falls . Referrals and appointments  In  addition, I have reviewed and discussed with patient certain preventive protocols, quality metrics, and best practice recommendations. A written personalized care plan for preventive services as well as general preventive health recommendations were provided to patient.     Ofilia Neas, LPN   [id number]   Nurse Notes: None

## 2020-06-26 NOTE — Patient Instructions (Signed)
Pre visit review using our clinic review tool, if applicable. No additional management support is needed unless otherwise documented below in the visit note.  Hold dosage today and then continue to take 5 mg on Sun, Tues, Thurs and take 2.5 mg on Mon, Friday and Saturdays and nothing on Wednesdays.  Re-check in 4 weeks.

## 2020-06-30 DIAGNOSIS — H3582 Retinal ischemia: Secondary | ICD-10-CM | POA: Diagnosis not present

## 2020-06-30 DIAGNOSIS — H34833 Tributary (branch) retinal vein occlusion, bilateral, with macular edema: Secondary | ICD-10-CM | POA: Diagnosis not present

## 2020-06-30 DIAGNOSIS — H34233 Retinal artery branch occlusion, bilateral: Secondary | ICD-10-CM | POA: Diagnosis not present

## 2020-06-30 DIAGNOSIS — H43813 Vitreous degeneration, bilateral: Secondary | ICD-10-CM | POA: Diagnosis not present

## 2020-07-10 ENCOUNTER — Other Ambulatory Visit: Payer: Self-pay | Admitting: Family Medicine

## 2020-07-10 DIAGNOSIS — Z7901 Long term (current) use of anticoagulants: Secondary | ICD-10-CM

## 2020-07-17 ENCOUNTER — Other Ambulatory Visit: Payer: Self-pay | Admitting: Family Medicine

## 2020-07-17 DIAGNOSIS — Z7901 Long term (current) use of anticoagulants: Secondary | ICD-10-CM

## 2020-07-24 ENCOUNTER — Ambulatory Visit (INDEPENDENT_AMBULATORY_CARE_PROVIDER_SITE_OTHER): Payer: PPO | Admitting: General Practice

## 2020-07-24 ENCOUNTER — Other Ambulatory Visit: Payer: Self-pay

## 2020-07-24 DIAGNOSIS — I4891 Unspecified atrial fibrillation: Secondary | ICD-10-CM | POA: Diagnosis not present

## 2020-07-24 DIAGNOSIS — Z7901 Long term (current) use of anticoagulants: Secondary | ICD-10-CM

## 2020-07-24 LAB — POCT INR: INR: 3.7 — AB (ref 2.0–3.0)

## 2020-07-24 NOTE — Patient Instructions (Addendum)
Pre visit review using our clinic review tool, if applicable. No additional management support is needed unless otherwise documented below in the visit note.  Hold dosage today and then change dosage and take  5 mg on Sun and Thurs and take 2.5 mg on Mon, Tues, Friday and Saturdays and nothing on Wednesdays.  Re-check in 3 to 4 weeks.

## 2020-07-24 NOTE — Progress Notes (Signed)
I have reviewed the results and agree with this plan   

## 2020-08-14 ENCOUNTER — Other Ambulatory Visit: Payer: Self-pay | Admitting: Family Medicine

## 2020-08-16 ENCOUNTER — Ambulatory Visit: Payer: PPO | Admitting: Pharmacist

## 2020-08-16 DIAGNOSIS — I10 Essential (primary) hypertension: Secondary | ICD-10-CM

## 2020-08-16 DIAGNOSIS — I4891 Unspecified atrial fibrillation: Secondary | ICD-10-CM

## 2020-08-16 NOTE — Chronic Care Management (AMB) (Signed)
Chronic Care Management Pharmacy  Name: Justin Gallegos  MRN: [id number] DOB: May 20, 1929  Initial Questions: 1. Have you seen any other providers since your last visit? n/a 2. Any changes in your medicines or health? No   Chief Complaint/ HPI  Justin Gallegos,  87 y.o. , male presents for their Follow-Up CCM visit with the clinical pharmacist via telephone due to COVID-19 Pandemic.  PCP : Laurey Morale, MD  Their chronic conditions include: A fib, HTN, HLD, Vit B12 deficiency, allergic rhinitis, retinal vein occlusion  Office Visits: -07/24/20 Meriam Sprague, RN: Patient presented for INR check. INR was 3.7, goal 2.5-3.0. Recommended hold x 1 dose. Plan for 0 mg on Wed, 5 mg on Sun and Thurs and 2.5 mg on all other days. Follow up in 3-4 weeks.  -06/26/20 Ofilia Neas, LPN: Patient presented for medicare annual wellness visit.  -02/09/20 [name]: Patient presented for increased swelling in the feet and ankles without pain or SOB. Started furosemide 20 mg each morning.  -12/30/19 Dr Sarajane Jews: Patient presented for annual exam. Discussed diet and exercise. Encouraged cardiology follow up with Dr. Alan Ripper office.  -12/13/19 [name]: Patient presented for swelling and rash on left foot. Started ketoconazole x 30 days for tinea pedis.  Consult Visit: -03/27/20 Dorris Carnes, MD (Cardiology): Patient presented for A fib follow up. Echo ordered given mild edema. Continue medications. Follow up in 6 months.  -01/05/20 Sherlynn Stalls (ophthalmology): Patient presented for vitreous degeneration and retinal artery branch occlusion follow up. Unable to view notes.  -12/17/19 Gertie Baron (urology): Patient presented for follow up for malignant neoplasm of bladder. Unable to view notes.  -10/26/19 Celesta Gentile DPM (podiatry): Patient presented for a rash on the side of his foot with occasional itching (tinea pedis). Started Lotrisone cream. Follow up in 3 months.  Medications: Outpatient Encounter  Medications as of 08/16/2020  Medication Sig  . Aflibercept (EYLEA IO) Inject into the eye.  [name] atorvastatin (LIPITOR) 40 MG tablet TAKE 1 TABLET BY MOUTH ONCE DAILY . APPOINTMENT REQUIRED FOR FUTURE REFILLS  . Cholecalciferol (VITAMIN D) 125 MCG (5000 UT) CAPS Take 5,000 Units by mouth daily.   [name] ezetimibe (ZETIA) 10 MG tablet Take 1 tablet by mouth once daily  . Fenofibrate 150 MG CAPS Take 1 capsule by mouth once daily  . fluticasone (FLONASE) 50 MCG/ACT nasal spray USE TWO SPRAYS IN EACH NOSTRIL AS NEEDED (Patient not taking: Reported on 06/26/2020)  . furosemide (LASIX) 20 MG tablet Take 1 tablet (20 mg total) by mouth every other day.  . metoprolol succinate (TOPROL-XL) 25 MG 24 hr tablet TAKE 1 TABLET BY MOUTH ONCE DAILY . APPOINTMENT REQUIRED FOR FUTURE REFILLS  . tobramycin (TOBREX) 0.3 % ophthalmic solution 1 drop.   [name] warfarin (COUMADIN) 2.5 MG tablet TAKE 1 TABLET BY MOUTH ON MONDAY, FRIDAY, AND SATURDAY OR AS DIRECTED BY THE ANTICOAGULATION CLINIC  . warfarin (COUMADIN) 5 MG tablet TAKE 1 TABLET BY MOUTH ONCE DAILY ON SUNDAYS, TUESDAYS, AND THURSDAYS. OR AS DIRECTED BY ANTICOAGULATION CLINIC   No facility-administered encounter medications on file as of 08/16/2020.    Current Diagnosis/Assessment:  Goals Addressed            This Visit's Progress   . Pharmacy care plan       CARE PLAN ENTRY (see longitudinal plan of care for additional care plan information)  Current Barriers:  . Chronic Disease Management support, education, and care coordination needs related to Hypertension, Hyperlipidemia, Atrial Fibrillation,  Allergic Rhinitis, and vitamin B 12 deficiency   Hypertension BP Readings from Last 3 Encounters:  03/27/20 108/78  02/09/20 130/70  12/30/19 122/68   . Pharmacist Clinical Goal(s): o Over the next 180 days, patient will work with PharmD and providers to maintain BP goal <140/90 . Current regimen:  . Metoprolol succinate 25 mg 1 tablet daily . Furosemide 20  mg 1 tablet every other day (or every 2-3 days)  . Interventions: o We discussed the importance of monitoring blood pressure at home and signs of low blood pressure (dizziness/lightheadedness) . Patient self care activities - Over the next 180 days, patient will: o Check blood pressure at least weekly, document, and provide at future appointments o Ensure daily salt intake < 2300 mg/day  Hyperlipidemia Lab Results  Component Value Date/Time   LDLCALC 70 11/21/2017 10:40 AM   LDLDIRECT 97.0 12/30/2019 11:12 AM   . Pharmacist Clinical Goal(s): o Over the next 180 days, patient will work with PharmD and providers to achieve LDL goal < 70 and triglyceride goal of < 150 . Current regimen:  . Atorvastatin 40 mg 1 tablet daily   . Fenofibrate 150 mg 1 capsule daily  . Ezetimibe 10 mg 1 tablet daily  . Interventions: o Discussed lowering cholesterol through diet by: [name] Limiting foods with cholesterol such as liver and other organ meats, egg yolks, shrimp, and whole milk dairy products . Avoiding saturated fats and trans fats and incorporating healthier fats, such as lean meat, nuts, and unsaturated oils like canola and olive oils . Eating foods with soluble fiber such as whole-grain cereals such as oatmeal and oat bran, fruits such as apples, bananas, oranges, pears, and prunes, legumes such as kidney beans, lentils, chick peas, black-eyed peas, and lima beans, and green leafy vegetables . Limiting alcohol intake . Patient self care activities - Over the next 180 days, patient will: o Continue taking medications as prescribed o Work on limiting sugar intake to help lower triglycerides  A fib . Pharmacist Clinical Goal(s) o Over the next 180 days, patient will work with PharmD and providers to maintain heart rate between 60 - 100 beats per minute . Current regimen:  . Metoprolol succinate 25 mg 1 tablet daily . Warfarin 0 mg on Wed, 2.5 mg on Mon, Fri and Sat and 5 mg on all other  days . Interventions: o We discussed monitoring for signs of bleeding such as nosebleeds, blood in urine or stool, and bruising with no apparent cause o We discussed foods with vitamin K and consuming a consistent amount to maintain INR in a normal range . Patient self care activities - Over the next 180 days, patient will: o Check and record heart rate along with blood pressure o Continue checking for signs of bleeding   Allergic rhinitis . Pharmacist Clinical Goal(s) o Over the next 180 days, patient will work with PharmD and providers to minimize symptoms of allergies . Current regimen:  o Flonase 50 mcg/act 2 sprays in each nostril as needed . Patient self care activities - Over the next 180 days, patient will: o Continue current medication  Medication management . Pharmacist Clinical Goal(s): o Over the next 180 days, patient will work with PharmD and providers to maintain optimal medication adherence . Current pharmacy: [facility] . Interventions o Comprehensive medication review performed. o Continue current medication management strategy . Patient self care activities - Over the next 180 days, patient will: o Focus on medication adherence o Take medications as prescribed  o Report any questions or concerns to PharmD and/or provider(s)  Please see past updates related to this goal by clicking on the "Past Updates" button in the selected goal          AFIB   Patient is currently rate controlled. Office heart rates are  Pulse Readings from Last 3 Encounters:  03/27/20 (!) 107  02/09/20 93  12/30/19 73    CHA2DS2-VASc Score =   5  The patient's score is based upon: age, HTN, TIA   Patient has failed these meds in past: none Patient is currently controlled on the following medications:  [name] Metoprolol succinate 25 mg 1 tablet daily . Furosemide 20 mg 1 tablet every other day . Warfarin 0 mg on Wed, 2.5 mg on Mon, Fri and Sat and 5 mg on all other days (INR checked by  cardiology RN)  We discussed:  the importance of monitoring heart rate with A fib; effects of vitamin K foods on INR and maintaining a consistent intake    Plan  Continue current medications     Hypertension   BP goal is:  <140/90  Office blood pressures are  BP Readings from Last 3 Encounters:  03/27/20 108/78  02/09/20 130/70  12/30/19 122/68   Patient checks BP at home never Patient home BP readings are ranging: n/a  Patient has failed these meds in the past: none Patient is currently controlled on the following medications:  [name] Metoprolol succinate 25 mg 1 tablet daily . Furosemide 20 mg 1 tablet every other day (or every 2-3 days)   We discussed the importance of monitoring blood pressure at home while taking blood pressure medications  Plan Start checking blood pressure at home and keep a log of all readings.  Continue current medications    Hyperlipidemia   LDL goal < 70  Lipid Panel     Component Value Date/Time   CHOL 178 12/30/2019 1112   CHOL 134 11/21/2017 1040   TRIG 312.0 (H) 12/30/2019 1112   TRIG 178 (H) 07/24/2006 0829   HDL 35.30 (L) 12/30/2019 1112   HDL 28 (L) 11/21/2017 1040   LDLCALC 70 11/21/2017 1040   LDLDIRECT 97.0 12/30/2019 1112    Hepatic Function Latest Ref Rng & Units 12/30/2019 05/29/2018 08/21/2017  Total Protein 6.0 - 8.3 g/dL 6.1 6.2 6.1  Albumin 3.5 - 5.2 g/dL 3.9 3.9 3.7  AST 0 - 37 U/L _0 ALT 0 - 53 U/L _1 Alk Phosphatase 39 - 117 U/L 61 65 56  Total Bilirubin 0.2 - 1.2 mg/dL 0.7 0.7 0.9  Bilirubin, Direct 0.0 - 0.3 mg/dL 0.1 0.2 0.2     The ASCVD Risk score (Rio [place]., et al., 2013) failed to calculate for the following reasons:   The 2013 ASCVD risk score is only valid for ages 19 to 43   Patient has failed these meds in past: rosuvastatin (unknown), simvastatin (needed higher intensity) Patient is currently uncontrolled on the following medications:  . Atorvastatin 40 mg 1 tablet daily    . Fenofibrate 150 mg 1 capsule daily  . Ezetimibe 10 mg 1 tablet daily   We discussed:  diet and exercise extensively  Plan Consider increasing atorvastatin to 80 mg for additional LDL lowering. Continue current medications and control with diet and exercise  Allergic rhinitis   Patient is currently controlled on the following medications:  . Flonase 50 mcg/act 2 sprays in each nostril as needed  We  discussed: the need for Flonase and patients reports using very infrequently   Plan  Continue current medications   Retinal vein occlusion   Patient is currently controlled on the following medications:  Alfonse Flavors injections every 6 - 8 weeks . Tobramycin 0.3% instill 1 drop into the right eye 4 times daily prior to procedure    Plan  Continue current medications   Vitamin B12 deficiency   Patient is currently not taking any medications.  We discussed:  Benefit of vitamin B12 supplementation if true deficiency  Plan Recommend repeat vit B12 level to ensure adequate supplementation.  Continue current management.  Miscellanous   Last vitamin D Lab Results  Component Value Date   VD25OH 35.24 05/29/2018    Patient is currently controlled (according to 2019 vit D level) on the following medications:  [name] Vitamin D 5000 units 1 capsule daily  We discussed:  Benefits of taking vitamin D and the risks of oversupplementation  Plan Recommend repeat vitamin D level. Continue current medications  Vaccines   Reviewed and discussed patient's vaccination history.    Immunization History  Administered Date(s) Administered  . Fluad Quad(high Dose 65+) 05/24/2019, 05/15/2020  . H1N1 07/21/2008  . Influenza Split 06/18/2011  . Influenza Whole 05/13/2007, 05/10/2008, 05/16/2009, 05/01/2010  . Influenza, High Dose Seasonal PF 05/10/2015, 04/17/2016, 05/05/2017, 05/13/2018  . Influenza,inj,Quad PF,6+ Mos 05/27/2013, 05/11/2014  . PFIZER SARS-COV-2 Vaccination 10/04/2019,  10/25/2019, 05/25/2020  . Pneumococcal Conjugate-13 05/11/2014  . Pneumococcal Polysaccharide-23 05/12/2005  . Td 11/02/2007  . Zoster 07/09/2006    Plan  Recommended patient receive Shingrix and tetanus vaccine at pharmacy or in office.  Medication Management   Patient's preferred pharmacy is:  [facility] [address] [place] [address] Phone: [phone] Fax: [phone]  Uses pill box? Yes - weekly pill box Pt endorses 95% compliance  We discussed: Current pharmacy is preferred with insurance plan and patient is satisfied with pharmacy services  Plan  Continue current medication management strategy    Follow up: 6 month phone visit  Jeni Salles, PharmD [place] [facility] [phone]

## 2020-08-18 NOTE — Patient Instructions (Signed)
Visit Information  Goals Addressed            This Visit's Progress   . Pharmacy care plan       CARE PLAN ENTRY (see longitudinal plan of care for additional care plan information)  Current Barriers:  . Chronic Disease Management support, education, and care coordination needs related to Hypertension, Hyperlipidemia, Atrial Fibrillation, Allergic Rhinitis, and vitamin B 12 deficiency   Hypertension BP Readings from Last 3 Encounters:  03/27/20 108/78  02/09/20 130/70  12/30/19 122/68   . Pharmacist Clinical Goal(s): o Over the next 180 days, patient will work with PharmD and providers to maintain BP goal <140/90 . Current regimen:  . Metoprolol succinate 25 mg 1 tablet daily . Furosemide 20 mg 1 tablet every other day (or every 2-3 days)  . Interventions: o We discussed the importance of monitoring blood pressure at home and signs of low blood pressure (dizziness/lightheadedness) . Patient self care activities - Over the next 180 days, patient will: o Check blood pressure at least weekly, document, and provide at future appointments o Ensure daily salt intake < 2300 mg/day  Hyperlipidemia Lab Results  Component Value Date/Time   LDLCALC 70 11/21/2017 10:40 AM   LDLDIRECT 97.0 12/30/2019 11:12 AM   . Pharmacist Clinical Goal(s): o Over the next 180 days, patient will work with PharmD and providers to achieve LDL goal < 70 and triglyceride goal of < 150 . Current regimen:  . Atorvastatin 40 mg 1 tablet daily   . Fenofibrate 150 mg 1 capsule daily  . Ezetimibe 10 mg 1 tablet daily  . Interventions: o Discussed lowering cholesterol through diet by: [name] Limiting foods with cholesterol such as liver and other organ meats, egg yolks, shrimp, and whole milk dairy products . Avoiding saturated fats and trans fats and incorporating healthier fats, such as lean meat, nuts, and unsaturated oils like canola and olive oils . Eating foods with soluble fiber such as whole-grain cereals  such as oatmeal and oat bran, fruits such as apples, bananas, oranges, pears, and prunes, legumes such as kidney beans, lentils, chick peas, black-eyed peas, and lima beans, and green leafy vegetables . Limiting alcohol intake . Patient self care activities - Over the next 180 days, patient will: o Continue taking medications as prescribed o Work on limiting sugar intake to help lower triglycerides  A fib . Pharmacist Clinical Goal(s) o Over the next 180 days, patient will work with PharmD and providers to maintain heart rate between 60 - 100 beats per minute . Current regimen:  . Metoprolol succinate 25 mg 1 tablet daily . Warfarin 0 mg on Wed, 2.5 mg on Mon, Fri and Sat and 5 mg on all other days . Interventions: o We discussed monitoring for signs of bleeding such as nosebleeds, blood in urine or stool, and bruising with no apparent cause o We discussed foods with vitamin K and consuming a consistent amount to maintain INR in a normal range . Patient self care activities - Over the next 180 days, patient will: o Check and record heart rate along with blood pressure o Continue checking for signs of bleeding   Allergic rhinitis . Pharmacist Clinical Goal(s) o Over the next 180 days, patient will work with PharmD and providers to minimize symptoms of allergies . Current regimen:  o Flonase 50 mcg/act 2 sprays in each nostril as needed . Patient self care activities - Over the next 180 days, patient will: o Continue current medication  Medication  management . Pharmacist Clinical Goal(s): o Over the next 180 days, patient will work with PharmD and providers to maintain optimal medication adherence . Current pharmacy: [facility] . Interventions o Comprehensive medication review performed. o Continue current medication management strategy . Patient self care activities - Over the next 180 days, patient will: o Focus on medication adherence o Take medications as prescribed o Report any  questions or concerns to PharmD and/or provider(s)  Please see past updates related to this goal by clicking on the "Past Updates" button in the selected goal         The patient verbalized understanding of instructions, educational materials, and care plan provided today and declined offer to receive copy of patient instructions, educational materials, and care plan.   Telephone follow up appointment with pharmacy team member scheduled for: 6 months  [name], [facility]

## 2020-08-19 ENCOUNTER — Other Ambulatory Visit: Payer: Self-pay | Admitting: Family Medicine

## 2020-08-21 ENCOUNTER — Other Ambulatory Visit: Payer: Self-pay

## 2020-08-21 ENCOUNTER — Ambulatory Visit (INDEPENDENT_AMBULATORY_CARE_PROVIDER_SITE_OTHER): Payer: PPO | Admitting: General Practice

## 2020-08-21 DIAGNOSIS — Z7901 Long term (current) use of anticoagulants: Secondary | ICD-10-CM

## 2020-08-21 DIAGNOSIS — I4891 Unspecified atrial fibrillation: Secondary | ICD-10-CM

## 2020-08-21 LAB — POCT INR: INR: 2.7 (ref 2.0–3.0)

## 2020-08-21 NOTE — Patient Instructions (Signed)
Pre visit review using our clinic review tool, if applicable. No additional management support is needed unless otherwise documented below in the visit note.  Continue to take  5 mg on Sun and Thurs and take 2.5 mg on Mon, Tues, Friday and Saturdays and nothing on Wednesdays.  Re-check in 4 weeks.

## 2020-08-22 ENCOUNTER — Telehealth: Payer: Self-pay | Admitting: Family Medicine

## 2020-08-22 DIAGNOSIS — I10 Essential (primary) hypertension: Secondary | ICD-10-CM

## 2020-08-22 DIAGNOSIS — I4891 Unspecified atrial fibrillation: Secondary | ICD-10-CM

## 2020-08-22 NOTE — Telephone Encounter (Signed)
-----   Message from [name], [facility] sent at 08/18/2020  2:38 PM EST ----- Regarding: CCM referral Hi,  I realized that I forgot to request for a CCM referral to be placed for Mr. Justin Gallegos. Can you please place one for him?  Thank you so much! Maddie

## 2020-08-22 NOTE — Telephone Encounter (Signed)
Done

## 2020-09-08 DIAGNOSIS — H43813 Vitreous degeneration, bilateral: Secondary | ICD-10-CM | POA: Diagnosis not present

## 2020-09-08 DIAGNOSIS — H34233 Retinal artery branch occlusion, bilateral: Secondary | ICD-10-CM | POA: Diagnosis not present

## 2020-09-08 DIAGNOSIS — H3582 Retinal ischemia: Secondary | ICD-10-CM | POA: Diagnosis not present

## 2020-09-08 DIAGNOSIS — H34833 Tributary (branch) retinal vein occlusion, bilateral, with macular edema: Secondary | ICD-10-CM | POA: Diagnosis not present

## 2020-09-18 ENCOUNTER — Ambulatory Visit (INDEPENDENT_AMBULATORY_CARE_PROVIDER_SITE_OTHER): Payer: PPO | Admitting: General Practice

## 2020-09-18 ENCOUNTER — Other Ambulatory Visit: Payer: Self-pay

## 2020-09-18 DIAGNOSIS — Z7901 Long term (current) use of anticoagulants: Secondary | ICD-10-CM

## 2020-09-18 DIAGNOSIS — I4891 Unspecified atrial fibrillation: Secondary | ICD-10-CM

## 2020-09-18 LAB — POCT INR: INR: 3.2 — AB (ref 2.0–3.0)

## 2020-09-18 NOTE — Patient Instructions (Addendum)
Pre visit review using our clinic review tool, if applicable. No additional management support is needed unless otherwise documented below in the visit note.  Skip dose today and then continue to take  5 mg on Sun and Thurs and take 2.5 mg on Mon, Tues, Friday and Saturdays and nothing on Wednesdays.  Re-check in 4 weeks.

## 2020-09-19 ENCOUNTER — Other Ambulatory Visit: Payer: Self-pay | Admitting: Family Medicine

## 2020-09-27 ENCOUNTER — Other Ambulatory Visit: Payer: Self-pay | Admitting: Family Medicine

## 2020-09-27 DIAGNOSIS — Z7901 Long term (current) use of anticoagulants: Secondary | ICD-10-CM

## 2020-09-27 NOTE — Progress Notes (Unsigned)
Cardiology Office Note   Date:  09/29/2020   ID:  Justin Gallegos, Justin Gallegos 09-Oct-1928, MRN [id number]  PCP:  Laurey Morale, MD  Cardiologist:   Dorris Carnes, MD   F/u of atrial fib      History of Present Illness: Justin Gallegos is a 87 y.o. male with a history of atrial fib, HL   I last saw the pt in clinic in Aug 2021  The pt comes in today with his wife.  Says he feels good Denies dizziness  No palpitations   No CP    Outpatient Medications Prior to Visit  Medication Sig Dispense Refill  . Aflibercept (EYLEA IO) Inject into the eye.    [name] atorvastatin (LIPITOR) 40 MG tablet TAKE 1 TABLET BY MOUTH ONCE DAILY . APPOINTMENT REQUIRED FOR FUTURE REFILLS 90 tablet 1  . Cholecalciferol (VITAMIN D) 125 MCG (5000 UT) CAPS Take 5,000 Units by mouth daily.     [name] ezetimibe (ZETIA) 10 MG tablet Take 1 tablet by mouth once daily 90 tablet 1  . Fenofibrate 150 MG CAPS Take 1 capsule by mouth once daily 30 capsule 2  . furosemide (LASIX) 20 MG tablet Take 1 tablet (20 mg total) by mouth every other day. 45 tablet 3  . metoprolol succinate (TOPROL-XL) 25 MG 24 hr tablet TAKE 1 TABLET BY MOUTH ONCE DAILY . APPOINTMENT REQUIRED FOR FUTURE REFILLS 90 tablet 1  . tobramycin (TOBREX) 0.3 % ophthalmic solution 1 drop.     [name] warfarin (COUMADIN) 2.5 MG tablet TAKE 1 TABLET BY MOUTH ON MONDAY, FRIDAY, AND SATURDAY OR AS DIRECTED BY THE ANTICOAGULATION CLINIC 40 tablet 0  . warfarin (COUMADIN) 5 MG tablet TAKE 1 TABLET BY MOUTH ONCE DAILY ON SUNDAYS, TUESDAYS, AND THURSDAYS. OR AS DIRECTED BY ANTICOAGULATION CLINIC 40 tablet 0  . fluticasone (FLONASE) 50 MCG/ACT nasal spray USE TWO SPRAYS IN EACH NOSTRIL AS NEEDED (Patient not taking: No sig reported) 16 g 0   No facility-administered medications prior to visit.     Allergies:   Penicillins and Amoxicillin-pot clavulanate   Past Medical History:  Diagnosis Date  . Arthritis   . Atrial fibrillation [facility])    sees [name]   . Cancer [facility])     ureteral, per Dr. Alinda Money   . Eye disease    sees [name]   . Hemorrhoids   . Hyperlipidemia   . Hypertension   . Low back pain   . TIA (transient ischemic attack)     Past Surgical History:  Procedure Laterality Date  . APPENDECTOMY    . CATARACT EXTRACTION, BILATERAL  2013   per [name]   . COLONOSCOPY  06-28-10   per [name], benign polyps and severe diverticulosis, no repeats needed   . HERNIA REPAIR    . NEPHRECTOMY    . TONSILLECTOMY    . URETERECTOMY       Social History:  The patient  reports that he has never smoked. He has never used smokeless tobacco. He reports that he does not drink alcohol and does not use drugs.   Family History:  The patient's family history includes Coronary artery disease in an other family member; Stroke in his father and another family member.    ROS:  Please see the history of present illness. All other systems are reviewed and  Negative to the above problem except as noted.    PHYSICAL EXAM: VS:  BP 124/60   Pulse  93   Ht 5\' 11"  (1.803 m)   Wt 203 lb 6.4 oz (92.3 kg)   SpO2 95%   BMI 28.37 kg/m   GEN: Well nourished, well developed, in no acute distress  HEENT: normal  Neck: JVP is normal  Cardiac: irreg irreg  ; no murmurs, No LE  edema  Respiratory: CTA   GI: soft, nontender, nondistended, + BS  No hepatomegaly  Ext NO deformity   EKG:  EKG is not ordered today     Lipid Panel    Component Value Date/Time   CHOL 178 12/30/2019 1112   CHOL 134 11/21/2017 1040   TRIG 312.0 (H) 12/30/2019 1112   TRIG 178 (H) 07/24/2006 0829   HDL 35.30 (L) 12/30/2019 1112   HDL 28 (L) 11/21/2017 1040   CHOLHDL 5 12/30/2019 1112   VLDL 62.4 (H) 12/30/2019 1112   LDLCALC 70 11/21/2017 1040   LDLDIRECT 97.0 12/30/2019 1112      Wt Readings from Last 3 Encounters:  09/29/20 203 lb 6.4 oz (92.3 kg)  03/27/20 202 lb 3.2 oz (91.7 kg)  02/09/20 210 lb 9.6 oz (95.5 kg)      ASSESSMENT AND PLAN:  1  Atrial fib  Keep  on same meds   He has remained on coumadin   INR relatively stable   Discussed Eliquis     2  CV dz  Mild   3  HL ON statin   Check lipids    4  HTN   BP is controlled     Will check lipids, CBC, TSH, CMET and PSA      Current medicines are reviewed at length with the patient today.  The patient does not have concerns regarding medicines.  Signed, Dorris Carnes, MD  09/29/2020 8:44 PM    [address], [place], [place]  [address] Phone: [phone]; Fax: [phone]

## 2020-09-29 ENCOUNTER — Encounter (INDEPENDENT_AMBULATORY_CARE_PROVIDER_SITE_OTHER): Payer: Self-pay

## 2020-09-29 ENCOUNTER — Encounter: Payer: Self-pay | Admitting: Internal Medicine

## 2020-09-29 ENCOUNTER — Ambulatory Visit: Payer: PPO | Admitting: Internal Medicine

## 2020-09-29 ENCOUNTER — Other Ambulatory Visit: Payer: Self-pay

## 2020-09-29 VITALS — BP 124/60 | HR 93 | Ht 71.0 in | Wt 203.4 lb

## 2020-09-29 DIAGNOSIS — I4891 Unspecified atrial fibrillation: Secondary | ICD-10-CM

## 2020-09-29 DIAGNOSIS — E782 Mixed hyperlipidemia: Secondary | ICD-10-CM | POA: Diagnosis not present

## 2020-09-29 DIAGNOSIS — Z0000 Encounter for general adult medical examination without abnormal findings: Secondary | ICD-10-CM

## 2020-09-29 DIAGNOSIS — I10 Essential (primary) hypertension: Secondary | ICD-10-CM | POA: Diagnosis not present

## 2020-09-29 NOTE — Patient Instructions (Signed)
Medication Instructions:  No changes *If you need a refill on your cardiac medications before your next appointment, please call your pharmacy*   Lab Work: Today: cbc, cmet, tsh, psa, lipids  If you have labs (blood work) drawn today and your tests are completely normal, you will receive your results only by: [name] MyChart Message (if you have MyChart) OR . A paper copy in the mail If you have any lab test that is abnormal or we need to change your treatment, we will call you to review the results.   Testing/Procedures: none   Follow-Up: At [facility], you and your health needs are our priority.  As part of our continuing mission to provide you with exceptional heart care, we have created designated Provider Care Teams.  These Care Teams include your primary Cardiologist (physician) and Advanced Practice Providers (APPs -  Physician Assistants and Nurse Practitioners) who all work together to provide you with the care you need, when you need it.  We recommend signing up for the patient portal called "MyChart".  Sign up information is provided on this After Visit Summary.  MyChart is used to connect with patients for Virtual Visits (Telemedicine).  Patients are able to view lab/test results, encounter notes, upcoming appointments, etc.  Non-urgent messages can be sent to your provider as well.   To learn more about what you can do with MyChart, go to [email or web address].    Your next appointment:   8 month(s)  The format for your next appointment:   In Person  Provider:   You may see Dorris Carnes, MD or one of the following Advanced Practice Providers on your designated Care Team:    Richardson Dopp, PA-C  [place], [place]

## 2020-09-30 LAB — COMPREHENSIVE METABOLIC PANEL
ALT: 18 IU/L (ref 0–44)
AST: 22 IU/L (ref 0–40)
Albumin/Globulin Ratio: 1.9 (ref 1.2–2.2)
Albumin: 3.7 g/dL (ref 3.6–4.6)
Alkaline Phosphatase: 71 IU/L (ref 44–121)
BUN/Creatinine Ratio: 11 (ref 10–24)
BUN: 18 mg/dL (ref 8–27)
Bilirubin Total: 0.5 mg/dL (ref 0.0–1.2)
CO2: 19 mmol/L — ABNORMAL LOW (ref 20–29)
Calcium: 8.7 mg/dL (ref 8.6–10.2)
Chloride: 103 mmol/L (ref 96–106)
Creatinine, Ser: 1.64 mg/dL — ABNORMAL HIGH (ref 0.76–1.27)
GFR calc Af Amer: 42 mL/min/{1.73_m2} — ABNORMAL LOW (ref >59)
GFR calc non Af Amer: 37 mL/min/{1.73_m2} — ABNORMAL LOW (ref >59)
Globulin, Total: 1.9 g/dL (ref 1.5–4.5)
Glucose: 129 mg/dL — ABNORMAL HIGH (ref 65–99)
Potassium: 4.4 mmol/L (ref 3.5–5.2)
Sodium: 136 mmol/L (ref 134–144)
Total Protein: 5.6 g/dL — ABNORMAL LOW (ref 6.0–8.5)

## 2020-09-30 LAB — LIPID PANEL
Chol/HDL Ratio: 5.3 ratio — ABNORMAL HIGH (ref 0.0–5.0)
Cholesterol, Total: 126 mg/dL (ref 100–199)
HDL: 24 mg/dL — ABNORMAL LOW (ref >39)
LDL Chol Calc (NIH): 56 mg/dL (ref 0–99)
Triglycerides: 294 mg/dL — ABNORMAL HIGH (ref 0–149)
VLDL Cholesterol Cal: 46 mg/dL — ABNORMAL HIGH (ref 5–40)

## 2020-09-30 LAB — CBC
Hematocrit: 44 % (ref 37.5–51.0)
Hemoglobin: 15.4 g/dL (ref 13.0–17.7)
MCH: 32 pg (ref 26.6–33.0)
MCHC: 35 g/dL (ref 31.5–35.7)
MCV: 92 fL (ref 79–97)
Platelets: 169 10*3/uL (ref 150–450)
RBC: 4.81 x10E6/uL (ref 4.14–5.80)
RDW: 13.6 % (ref 11.6–15.4)
WBC: 5.6 10*3/uL (ref 3.4–10.8)

## 2020-09-30 LAB — TSH: TSH: 2.89 u[IU]/mL (ref 0.450–4.500)

## 2020-09-30 LAB — PSA: Prostate Specific Ag, Serum: 1.1 ng/mL (ref 0.0–4.0)

## 2020-10-02 ENCOUNTER — Telehealth: Payer: Self-pay | Admitting: Internal Medicine

## 2020-10-02 NOTE — Telephone Encounter (Signed)
Spoke with patient's wife.  Provided lab results and recommendations.

## 2020-10-02 NOTE — Telephone Encounter (Signed)
Patient's wife is returning call to discuss lab results. She requested a detailed voice message if they are not available when the call is returned.

## 2020-10-16 ENCOUNTER — Ambulatory Visit (INDEPENDENT_AMBULATORY_CARE_PROVIDER_SITE_OTHER): Payer: PPO | Admitting: General Practice

## 2020-10-16 ENCOUNTER — Other Ambulatory Visit: Payer: Self-pay

## 2020-10-16 DIAGNOSIS — Z7901 Long term (current) use of anticoagulants: Secondary | ICD-10-CM | POA: Diagnosis not present

## 2020-10-16 LAB — POCT INR: INR: 2.7 (ref 2.0–3.0)

## 2020-10-16 NOTE — Patient Instructions (Signed)
Pre visit review using our clinic review tool, if applicable. No additional management support is needed unless otherwise documented below in the visit note.  Continue to take  5 mg on Sun and Thurs and take 2.5 mg on Mon, Tues, Friday and Saturdays and nothing on Wednesdays.  Re-check in 4 to 6 weeks.  

## 2020-11-07 DIAGNOSIS — H34833 Tributary (branch) retinal vein occlusion, bilateral, with macular edema: Secondary | ICD-10-CM | POA: Diagnosis not present

## 2020-11-07 DIAGNOSIS — H43813 Vitreous degeneration, bilateral: Secondary | ICD-10-CM | POA: Diagnosis not present

## 2020-11-07 DIAGNOSIS — H34233 Retinal artery branch occlusion, bilateral: Secondary | ICD-10-CM | POA: Diagnosis not present

## 2020-11-10 ENCOUNTER — Other Ambulatory Visit: Payer: Self-pay

## 2020-11-10 ENCOUNTER — Telehealth: Payer: Self-pay | Admitting: Pharmacist

## 2020-11-10 NOTE — Chronic Care Management (AMB) (Addendum)
    Chronic Care Management Pharmacy Assistant   Name: Justin Gallegos  MRN: [id number] DOB: 1929-07-23  Reason for Encounter: General Adherence Call      Recent office visits:  None  Recent consult visits:  02.18.2022 Fay Records, MD Cardiology D/C Fluticasone [facility]) 50 MCG/ACT nasal spray  Hospital visits:  None in previous 6 months  Medications: Outpatient Encounter Medications as of 11/10/2020  Medication Sig   Aflibercept (EYLEA IO) Inject into the eye.   atorvastatin (LIPITOR) 40 MG tablet TAKE 1 TABLET BY MOUTH ONCE DAILY . APPOINTMENT REQUIRED FOR FUTURE REFILLS   Cholecalciferol (VITAMIN D) 125 MCG (5000 UT) CAPS Take 5,000 Units by mouth daily.    ezetimibe (ZETIA) 10 MG tablet Take 1 tablet by mouth once daily   Fenofibrate 150 MG CAPS Take 1 capsule by mouth once daily   furosemide (LASIX) 20 MG tablet Take 1 tablet (20 mg total) by mouth every other day.   metoprolol succinate (TOPROL-XL) 25 MG 24 hr tablet TAKE 1 TABLET BY MOUTH ONCE DAILY . APPOINTMENT REQUIRED FOR FUTURE REFILLS   tobramycin (TOBREX) 0.3 % ophthalmic solution 1 drop.    warfarin (COUMADIN) 2.5 MG tablet TAKE 1 TABLET BY MOUTH ON MONDAY, FRIDAY, AND SATURDAY OR AS DIRECTED BY THE ANTICOAGULATION CLINIC   warfarin (COUMADIN) 5 MG tablet TAKE 1 TABLET BY MOUTH ONCE DAILY ON SUNDAYS, TUESDAYS, AND THURSDAYS. OR AS DIRECTED BY ANTICOAGULATION CLINIC   No facility-administered encounter medications on file as of 11/10/2020.   I spoke with the patient and discussed medication adherence. He has a follow-up appointment with his PCP on 11/13/2020 to discuss lab from cardiology from last month. His fluticasone 50 MCG nasal spray was discontinued. There are no other changes to his medications.  He states he has not had any falls or issues with his medication. He denies any side effects from his current medication. He denies any ED visits since his last CPP or PCP follow-up. He has no issues with his current  pharmacy.  Star Rating Drugs:  Dispensed Quantity Pharmacy  Atorvastatin 40 mg 01.03.2022 [address] [place], [place] [phone]

## 2020-11-13 ENCOUNTER — Other Ambulatory Visit: Payer: Self-pay

## 2020-11-13 ENCOUNTER — Encounter: Payer: Self-pay | Admitting: Family Medicine

## 2020-11-13 ENCOUNTER — Ambulatory Visit (INDEPENDENT_AMBULATORY_CARE_PROVIDER_SITE_OTHER): Payer: PPO | Admitting: Family Medicine

## 2020-11-13 VITALS — BP 110/72 | HR 95 | Temp 97.8°F | Ht 71.0 in | Wt 204.0 lb

## 2020-11-13 DIAGNOSIS — Z23 Encounter for immunization: Secondary | ICD-10-CM

## 2020-11-13 DIAGNOSIS — Z0000 Encounter for general adult medical examination without abnormal findings: Secondary | ICD-10-CM

## 2020-11-13 DIAGNOSIS — E782 Mixed hyperlipidemia: Secondary | ICD-10-CM

## 2020-11-13 MED ORDER — EZETIMIBE 10 MG PO TABS: 10.0000 mg | ORAL_TABLET | Freq: Every day | ORAL | 3 refills | Status: DC

## 2020-11-13 MED ORDER — METOPROLOL SUCCINATE ER 25 MG PO TB24: 25.0000 mg | ORAL_TABLET | Freq: Every day | ORAL | 3 refills | Status: DC

## 2020-11-13 MED ORDER — ATORVASTATIN CALCIUM 40 MG PO TABS: 40.0000 mg | ORAL_TABLET | Freq: Every day | ORAL | 3 refills | Status: DC

## 2020-11-13 NOTE — Progress Notes (Signed)
Subjective:    Patient ID: Justin Gallegos, male    DOB: [birth year], 87 y.o.   MRN: [id number]  HPI Here with his wife for a well exam. He is doing well. He occasionally has swelling in the ankles but he manages this with prn Lasix. His BP is stable. He saw [name] last month and had a good cardiac evaluation. He had labs drawn that day which we reviewed together today. His TG were a bit high at 294 but the LDL was excellent at 56. The other results were unremarkable.    Review of Systems  Constitutional: Negative.   HENT: Negative.   Eyes: Negative.   Respiratory: Negative.   Cardiovascular: Positive for leg swelling.  Gastrointestinal: Negative.   Genitourinary: Negative.   Musculoskeletal: Negative.   Skin: Negative.   Neurological: Negative.   Psychiatric/Behavioral: Negative.        Objective:   Physical Exam Constitutional:      General: He is not in acute distress.    Appearance: He is well-developed. He is not diaphoretic.  HENT:     Head: Normocephalic and atraumatic.     Right Ear: External ear normal.     Left Ear: External ear normal.     Nose: Nose normal.     Mouth/Throat:     Pharynx: No oropharyngeal exudate.  Eyes:     General: No scleral icterus.       Right eye: No discharge.        Left eye: No discharge.     Conjunctiva/sclera: Conjunctivae normal.     Pupils: Pupils are equal, round, and reactive to light.  Neck:     Thyroid: No thyromegaly.     Vascular: No JVD.     Trachea: No tracheal deviation.  Cardiovascular:     Rate and Rhythm: Normal rate. Rhythm irregular.     Pulses: Normal pulses.     Heart sounds: Normal heart sounds. No murmur heard. No friction rub. No gallop.   Pulmonary:     Effort: Pulmonary effort is normal. No respiratory distress.     Breath sounds: Normal breath sounds. No wheezing or rales.  Chest:     Chest wall: No tenderness.  Abdominal:     General: Bowel sounds are normal. There is no distension.      Palpations: Abdomen is soft. There is no mass.     Tenderness: There is no abdominal tenderness. There is no guarding or rebound.  Genitourinary:    Penis: Normal. No tenderness.      Testes: Normal.  Musculoskeletal:        General: No tenderness. Normal range of motion.     Cervical back: Neck supple.     Comments: Trace edema in both ankles   Lymphadenopathy:     Cervical: No cervical adenopathy.  Skin:    General: Skin is warm and dry.     Coloration: Skin is not pale.     Findings: No erythema or rash.  Neurological:     Mental Status: He is alert and oriented to person, place, and time.     Cranial Nerves: No cranial nerve deficit.     Motor: No abnormal muscle tone.     Coordination: Coordination normal.     Deep Tendon Reflexes: Reflexes are normal and symmetric. Reflexes normal.  Psychiatric:        Behavior: Behavior normal.        Thought Content: Thought content normal.  Judgment: Judgment normal.           Assessment & Plan:  Well exam. We discussed diet and exercise. Given a TDaP. I advised him to get a 4th Covid vaccine as well.  Alysia Penna, MD

## 2020-11-13 NOTE — Addendum Note (Signed)
Addended by: Wyvonne Lenz on: 11/13/2020 02:17 PM   Modules accepted: Orders

## 2020-11-27 ENCOUNTER — Other Ambulatory Visit: Payer: Self-pay

## 2020-11-27 ENCOUNTER — Ambulatory Visit (INDEPENDENT_AMBULATORY_CARE_PROVIDER_SITE_OTHER): Payer: PPO | Admitting: General Practice

## 2020-11-27 DIAGNOSIS — Z7901 Long term (current) use of anticoagulants: Secondary | ICD-10-CM

## 2020-11-27 DIAGNOSIS — I4891 Unspecified atrial fibrillation: Secondary | ICD-10-CM | POA: Diagnosis not present

## 2020-11-27 LAB — POCT INR: INR: 2.6 (ref 2.0–3.0)

## 2020-11-27 NOTE — Patient Instructions (Signed)
Pre visit review using our clinic review tool, if applicable. No additional management support is needed unless otherwise documented below in the visit note.  Continue to take  5 mg on Sun and Thurs and take 2.5 mg on Mon, Tues, Friday and Saturdays and nothing on Wednesdays.  Re-check in 4 to 6 weeks.

## 2020-12-04 ENCOUNTER — Other Ambulatory Visit: Payer: Self-pay | Admitting: Family Medicine

## 2020-12-04 DIAGNOSIS — Z7901 Long term (current) use of anticoagulants: Secondary | ICD-10-CM

## 2020-12-04 NOTE — Telephone Encounter (Signed)
Pt. Last INR completed on 11/27/20. Last refill was 09/28/20. Pls advise

## 2020-12-05 ENCOUNTER — Other Ambulatory Visit: Payer: Self-pay | Admitting: General Practice

## 2020-12-05 DIAGNOSIS — Z7901 Long term (current) use of anticoagulants: Secondary | ICD-10-CM

## 2020-12-05 MED ORDER — WARFARIN SODIUM 5 MG PO TABS: ORAL_TABLET | ORAL | 1 refills | Status: DC

## 2020-12-05 MED ORDER — WARFARIN SODIUM 2.5 MG PO TABS: ORAL_TABLET | ORAL | 1 refills | Status: DC

## 2020-12-05 NOTE — Telephone Encounter (Signed)
I do not understand. Justin Gallegos manages his Coumadin. Please ask her about this

## 2020-12-21 ENCOUNTER — Other Ambulatory Visit: Payer: Self-pay

## 2020-12-21 ENCOUNTER — Other Ambulatory Visit: Payer: Self-pay | Admitting: Family Medicine

## 2020-12-21 MED ORDER — FENOFIBRATE 150 MG PO CAPS: 1.0000 | ORAL_CAPSULE | Freq: Every day | ORAL | 2 refills | Status: DC

## 2021-01-02 DIAGNOSIS — H34233 Retinal artery branch occlusion, bilateral: Secondary | ICD-10-CM | POA: Diagnosis not present

## 2021-01-02 DIAGNOSIS — H43813 Vitreous degeneration, bilateral: Secondary | ICD-10-CM | POA: Diagnosis not present

## 2021-01-02 DIAGNOSIS — H34833 Tributary (branch) retinal vein occlusion, bilateral, with macular edema: Secondary | ICD-10-CM | POA: Diagnosis not present

## 2021-01-02 DIAGNOSIS — H3563 Retinal hemorrhage, bilateral: Secondary | ICD-10-CM | POA: Diagnosis not present

## 2021-01-12 ENCOUNTER — Other Ambulatory Visit: Payer: Self-pay

## 2021-01-15 ENCOUNTER — Ambulatory Visit (INDEPENDENT_AMBULATORY_CARE_PROVIDER_SITE_OTHER): Payer: PPO | Admitting: General Practice

## 2021-01-15 ENCOUNTER — Other Ambulatory Visit: Payer: Self-pay

## 2021-01-15 DIAGNOSIS — I4891 Unspecified atrial fibrillation: Secondary | ICD-10-CM

## 2021-01-15 DIAGNOSIS — Z7901 Long term (current) use of anticoagulants: Secondary | ICD-10-CM

## 2021-01-15 LAB — POCT INR: INR: 3.5 — AB (ref 2.0–3.0)

## 2021-01-15 NOTE — Patient Instructions (Addendum)
Pre visit review using our clinic review tool, if applicable. No additional management support is needed unless otherwise documented below in the visit note.  Hold dosage today (6/6) and then start taking 2.5 mg all days except take 5 mg on Sundays only.  Re-check in 3 to 4 weeks.

## 2021-02-05 ENCOUNTER — Ambulatory Visit (INDEPENDENT_AMBULATORY_CARE_PROVIDER_SITE_OTHER): Payer: PPO | Admitting: General Practice

## 2021-02-05 ENCOUNTER — Other Ambulatory Visit: Payer: Self-pay

## 2021-02-05 DIAGNOSIS — I4891 Unspecified atrial fibrillation: Secondary | ICD-10-CM

## 2021-02-05 DIAGNOSIS — Z7901 Long term (current) use of anticoagulants: Secondary | ICD-10-CM

## 2021-02-05 LAB — POCT INR: INR: 3.6 — AB (ref 2.0–3.0)

## 2021-02-05 NOTE — Patient Instructions (Addendum)
Pre visit review using our clinic review tool, if applicable. No additional management support is needed unless otherwise documented below in the visit note.  Hold dosage today (6/27) and then start taking 2.5 mg all days .  Re-check in 3 to 4 weeks.

## 2021-02-09 ENCOUNTER — Telehealth: Payer: Self-pay | Admitting: Pharmacist

## 2021-02-09 NOTE — Chronic Care Management (AMB) (Signed)
Date- Patient called to remind of appointment with  Watt Climes on July 5th, 2022 at 10:00 am.   No answer, left message of appointment date, time and type of appointment (telephone). Left message to have all medications, supplements, blood pressure and/or blood sugar logs available during appointment and to return call if need to reschedule.   Star Rating Drug: Medication Dispensed Quantity Pharmacy  Atorvastatin 40 mg 04.04.2022 [address] [facility]    Any gaps in medications fill history? No   Maia Breslow, [place] Pharmacist Assistant [phone]

## 2021-02-13 ENCOUNTER — Ambulatory Visit (INDEPENDENT_AMBULATORY_CARE_PROVIDER_SITE_OTHER): Payer: PPO | Admitting: Family Medicine

## 2021-02-13 ENCOUNTER — Ambulatory Visit (INDEPENDENT_AMBULATORY_CARE_PROVIDER_SITE_OTHER): Payer: PPO | Admitting: Pharmacist

## 2021-02-13 ENCOUNTER — Encounter: Payer: Self-pay | Admitting: Family Medicine

## 2021-02-13 ENCOUNTER — Other Ambulatory Visit: Payer: Self-pay

## 2021-02-13 VITALS — BP 110/72 | HR 92 | Temp 98.2°F | Wt 202.0 lb

## 2021-02-13 DIAGNOSIS — I10 Essential (primary) hypertension: Secondary | ICD-10-CM | POA: Diagnosis not present

## 2021-02-13 DIAGNOSIS — B354 Tinea corporis: Secondary | ICD-10-CM

## 2021-02-13 DIAGNOSIS — E782 Mixed hyperlipidemia: Secondary | ICD-10-CM

## 2021-02-13 DIAGNOSIS — I4891 Unspecified atrial fibrillation: Secondary | ICD-10-CM | POA: Diagnosis not present

## 2021-02-13 MED ORDER — KETOCONAZOLE 2 % EX CREA: 1.0000 "application " | TOPICAL_CREAM | Freq: Every day | CUTANEOUS | 5 refills | Status: DC

## 2021-02-13 NOTE — Progress Notes (Signed)
   Subjective:    Patient ID: Justin Gallegos, male    DOB: [birth year], 87 y.o.   MRN: [id number]  HPI Here to check a spot on the left lowe leg that appeared about 2 months ago. It does not itch. It has not changed in appearance.    Review of Systems  Constitutional: Negative.   Respiratory: Negative.    Cardiovascular: Negative.   Skin:  Positive for rash.      Objective:   Physical Exam Constitutional:      Appearance: Normal appearance.  Cardiovascular:     Rate and Rhythm: Normal rate and regular rhythm.     Pulses: Normal pulses.     Heart sounds: Normal heart sounds.  Pulmonary:     Effort: Pulmonary effort is normal.     Breath sounds: Normal breath sounds.  Skin:    Comments: The left lower leg has a 1 cm round well marginated red lesion on the medial side above the ankle. The outer rim is slightly raised   Neurological:     Mental Status: He is alert.          Assessment & Plan:  Tinea corporis, treat with Ketoconazole cream BID.  Alysia Penna, MD

## 2021-02-13 NOTE — Progress Notes (Signed)
Chronic Care Management Pharmacy Note  02/13/2021 Name:  Justin Gallegos MRN:  [id number] DOB:  [birth year]  Summary: BP is at goal < 140/90 Patient noted a concerning spot on his ankle  Recommendations/Changes made from today's visit: -Recommended PCP evaluation of spot on ankle. Patient is scheduled for today 02/13/21. -Recommended regular home monitoring of BP.  Plan: Follow up BP assessment in 3 months.   Subjective: Justin Gallegos is an 87 y.o. year old male who is a primary patient of Laurey Morale, MD.  The CCM team was consulted for assistance with disease management and care coordination needs.    Engaged with patient by telephone for follow up visit in response to provider referral for pharmacy case management and/or care coordination services.   Consent to Services:  The patient was given information about Chronic Care Management services, agreed to services, and gave verbal consent prior to initiation of services.  Please see initial visit note for detailed documentation.   Patient Care Team: Laurey Morale, MD as PCP - Desma Paganini, MD as PCP - Cardiology (Cardiology) Sherlynn Stalls, MD as Consulting Physician (Ophthalmology) [name], [facility] as Pharmacist (Pharmacist)  Recent office visits: 02/05/21 Meriam Sprague, RN: Patient presented for anti-coag visit. INR 3.6, goal 2.5-3.0. Held dose x 1, then decreased to 2.5 mg every day.   11/13/20 Alysia Penna, MD: Patient presented for annual exam. Tdap administered.   Recent consult visits: 11/07/20 Sherlynn Stalls (ophthalmology): Unable to access notes.  09/29/20 Dorris Carnes, MD (cardiology): Patient presented for Afib follow up. No medication changes.   Hospital visits: None in previous 6 months   Objective:  Lab Results  Component Value Date   CREATININE 1.64 (H) 09/29/2020   BUN 18 09/29/2020   GFR 33.53 (L) 12/30/2019   GFRNONAA 37 (L) 09/29/2020   GFRAA 42 (L) 09/29/2020   NA 136 09/29/2020    K 4.4 09/29/2020   CALCIUM 8.7 09/29/2020   CO2 19 (L) 09/29/2020   GLUCOSE 129 (H) 09/29/2020    Lab Results  Component Value Date/Time   HGBA1C (H) 07/28/2010 01:59 PM    5.9 (NOTE)                                                                       According to the ADA Clinical Practice Recommendations for 2011, when HbA1c is used as a screening test:   >=6.5%   Diagnostic of Diabetes Mellitus           (if abnormal result  is confirmed)  5.7-6.4%   Increased risk of developing Diabetes Mellitus  References:Diagnosis and Classification of Diabetes Mellitus,Diabetes HKVQ,2595,63(OVFIE 1):S62-S69 and Standards of Medical Care in         Diabetes - 2011,Diabetes Care,2011,34  (Suppl 1):S11-S61.   HGBA1C 5.5 10/02/2007 08:51 AM   GFR 33.53 (L) 12/30/2019 11:12 AM   GFR 38.82 (L) 05/29/2018 02:27 PM    Last diabetic Eye exam: No results found for: HMDIABEYEEXA  Last diabetic Foot exam: No results found for: HMDIABFOOTEX   Lab Results  Component Value Date   CHOL 126 09/29/2020   HDL 24 (L) 09/29/2020   LDLCALC 56 09/29/2020   LDLDIRECT 97.0 12/30/2019   TRIG  294 (H) 09/29/2020   CHOLHDL 5.3 (H) 09/29/2020    Hepatic Function Latest Ref Rng & Units 09/29/2020 12/30/2019 05/29/2018  Total Protein 6.0 - 8.5 g/dL 5.6(L) 6.1 6.2  Albumin 3.6 - 4.6 g/dL 3.7 3.9 3.9  AST 0 - 40 IU/L '22 22 21  ' ALT 0 - 44 IU/L '18 16 18  ' Alk Phosphatase 44 - 121 IU/L 71 61 65  Total Bilirubin 0.0 - 1.2 mg/dL 0.5 0.7 0.7  Bilirubin, Direct 0.0 - 0.3 mg/dL - 0.1 0.2    Lab Results  Component Value Date/Time   TSH 2.890 09/29/2020 11:52 AM   TSH 3.28 12/30/2019 11:12 AM   FREET4 0.83 05/29/2018 02:27 PM    CBC Latest Ref Rng & Units 09/29/2020 12/30/2019 05/29/2018  WBC 3.4 - 10.8 x10E3/uL 5.6 5.0 6.1  Hemoglobin 13.0 - 17.7 g/dL 15.4 15.4 16.4  Hematocrit 37.5 - 51.0 % 44.0 44.2 47.4  Platelets 150 - 450 x10E3/uL 169 155.0 160.0    Lab Results  Component Value Date/Time   VD25OH 35.24  05/29/2018 02:27 PM    Clinical ASCVD: Yes  The ASCVD Risk score Mikey Bussing DC Jr., et al., 2013) failed to calculate for the following reasons:   The 2013 ASCVD risk score is only valid for ages 58 to 84    Depression screen PHQ 2/9 06/26/2020 10/26/2018 10/08/2017  Decreased Interest 0 0 0  Down, Depressed, Hopeless 0 0 0  PHQ - 2 Score 0 0 0  Altered sleeping 0 0 -  Tired, decreased energy 0 0 -  Change in appetite 0 0 -  Feeling bad or failure about yourself  0 0 -  Trouble concentrating 0 0 -  Moving slowly or fidgety/restless 0 0 -  Suicidal thoughts 0 0 -  PHQ-9 Score 0 0 -  Difficult doing work/chores Not difficult at all - -  Some recent data might be hidden    CHA2DS2/VAS Stroke Risk Points  Current as of 8 minutes ago     3 >= 2 Points: High Risk  1 - 1.99 Points: Medium Risk  0 Points: Low Risk    No Change      Details    This score determines the patient's risk of having a stroke if the  patient has atrial fibrillation.       Points Metrics  0 Has Congestive Heart Failure:  No    Current as of 8 minutes ago  0 Has Vascular Disease:  No    Current as of 8 minutes ago  1 Has Hypertension:  Yes    Current as of 8 minutes ago  2 Age:  35    Current as of 8 minutes ago  0 Has Diabetes:  No    Current as of 8 minutes ago  0 Had Stroke:  No  Had TIA:  No  Had Thromboembolism:  No    Current as of 8 minutes ago  0 Male:  No    Current as of 8 minutes ago     Social History   Tobacco Use  Smoking Status Never  Smokeless Tobacco Never   BP Readings from Last 3 Encounters:  11/13/20 110/72  09/29/20 124/60  03/27/20 108/78   Pulse Readings from Last 3 Encounters:  11/13/20 95  09/29/20 93  03/27/20 (!) 107   Wt Readings from Last 3 Encounters:  11/13/20 204 lb (92.5 kg)  09/29/20 203 lb 6.4 oz (92.3 kg)  03/27/20 202 lb 3.2 oz (  91.7 kg)   BMI Readings from Last 3 Encounters:  11/13/20 28.45 kg/m  09/29/20 28.37 kg/m  03/27/20 28.20 kg/m     Assessment/Interventions: Review of patient past medical history, allergies, medications, health status, including review of consultants reports, laboratory and other test data, was performed as part of comprehensive evaluation and provision of chronic care management services.   SDOH:  (Social Determinants of Health) assessments and interventions performed: No  SDOH Screenings   Alcohol Screen: Low Risk    Last Alcohol Screening Score (AUDIT): 0  Depression (PHQ2-9): Low Risk    PHQ-2 Score: 0  Financial Resource Strain: Low Risk    Difficulty of Paying Living Expenses: Not hard at all  Food Insecurity: No Food Insecurity   Worried About Charity fundraiser in the Last Year: Never true   Ran Out of Food in the Last Year: Never true  Housing: Low Risk    Last Housing Risk Score: 0  Physical Activity: Insufficiently Active   Days of Exercise per Week: 2 days   Minutes of Exercise per Session: 30 min  Social Connections: Moderately Integrated   Frequency of Communication with Friends and Family: More than three times a week   Frequency of Social Gatherings with Friends and Family: More than three times a week   Attends Religious Services: More than 4 times per year   Active Member of [employer] or Organizations: No   Attends Archivist Meetings: Never   Marital Status: Married  Stress: No Stress Concern Present   Feeling of Stress : Not at all  Tobacco Use: Low Risk    Smoking Tobacco Use: Never   Smokeless Tobacco Use: Never  Transportation Needs: No Transportation Needs   Lack of Transportation (Medical): No   Lack of Transportation (Non-Medical): No    CCM Care Plan  Allergies  Allergen Reactions   Penicillins     Unable to breathe    Amoxicillin-Pot Clavulanate     REACTION: throat swelling    Medications Reviewed Today     Reviewed by Laurey Morale, MD (Physician) on 11/13/20 at 1336  Med List Status: <None>   Medication Order Taking? Sig Documenting  Provider Last Dose Status Informant  Aflibercept (EYLEA IO) [id number] Yes Inject into the eye. [provider] Taking Active   atorvastatin (LIPITOR) 40 MG tablet [id number] Yes TAKE 1 TABLET BY MOUTH ONCE DAILY . APPOINTMENT REQUIRED FOR FUTURE REFILLS Laurey Morale, MD Taking Active   Cholecalciferol (VITAMIN D) 125 MCG (5000 UT) CAPS [id number] Yes Take 5,000 Units by mouth daily.  [provider] Taking Active   ezetimibe (ZETIA) 10 MG tablet [id number] Yes Take 1 tablet by mouth once daily Laurey Morale, MD Taking Active   Fenofibrate 150 MG CAPS [id number] Yes Take 1 capsule by mouth once daily Laurey Morale, MD Taking Active   furosemide (LASIX) 20 MG tablet [id number] Yes Take 1 tablet (20 mg total) by mouth every other day. Fay Records, MD Taking Active   metoprolol succinate (TOPROL-XL) 25 MG 24 hr tablet [id number] Yes TAKE 1 TABLET BY MOUTH ONCE DAILY . APPOINTMENT REQUIRED FOR FUTURE REFILLS Laurey Morale, MD Taking Active   tobramycin (TOBREX) 0.3 % ophthalmic solution [id number] Yes 1 drop.  [provider] Taking Active   warfarin (COUMADIN) 2.5 MG tablet [id number] Yes TAKE 1 TABLET BY MOUTH ON MONDAY, FRIDAY, AND SATURDAY OR AS DIRECTED BY THE ANTICOAGULATION CLINIC Laurey Morale, MD Taking Active  warfarin (COUMADIN) 5 MG tablet [id number] Yes TAKE 1 TABLET BY MOUTH ONCE DAILY ON SUNDAYS, TUESDAYS, AND THURSDAYS. OR AS DIRECTED BY ANTICOAGULATION CLINIC Laurey Morale, MD Taking Active             Patient Active Problem List   Diagnosis Date Noted   Ankle edema, bilateral 02/09/2020   B12 deficiency 09/01/2018   Long term (current) use of anticoagulants 07/16/2017   Branch retinal vein occlusion of left eye 10/14/2016   Rectal itching 04/05/2014   Tinea cruris 04/05/2014   Encounter for therapeutic drug monitoring 09/27/2013   Carotid bruit 07/21/2013   Infected sebaceous cyst of skin 02/19/2011   Chronic anticoagulation 02/19/2011    Transient cerebral ischemia 07/31/2010   ACUTE BRONCHITIS 05/29/2010   TUBULOVILLOUS ADENOMA, COLON, HX OF 05/17/2010   Disorder resulting from impaired renal function 10/24/2009   PERSONAL HX [place] URINARY ORGAN 03/02/2009   ALLERGIC RHINITIS 11/03/2008   LOW BACK PAIN 04/19/2008   Hyperlipidemia 06/11/2007   Essential hypertension 06/11/2007   OSTEOARTHRITIS 06/11/2007   CHICKENPOX, HX OF 06/11/2007   Atrial fibrillation ([place]) 03/13/2007    Immunization History  Administered Date(s) Administered   Fluad Quad(high Dose 65+) 05/24/2019, 05/15/2020   H1N1 07/21/2008   Influenza Split 06/18/2011   Influenza Whole 05/13/2007, 05/10/2008, 05/16/2009, 05/01/2010   Influenza, High Dose Seasonal PF 05/10/2015, 04/17/2016, 05/05/2017, 05/13/2018   Influenza,inj,Quad PF,6+ Mos 05/27/2013, 05/11/2014   PFIZER(Purple Top)SARS-COV-2 Vaccination 10/04/2019, 10/25/2019, 05/25/2020   Pneumococcal Conjugate-13 05/11/2014   Pneumococcal Polysaccharide-23 05/12/2005   Td 11/02/2007   Tdap 11/13/2020   Zoster, Live 07/09/2006   Patient thinks he has a spot of ring worm on his left ankle. Scheduled an appointment with PCP for this afternoon.  Conditions to be addressed/monitored:  Hypertension, Hyperlipidemia, Atrial Fibrillation, Allergic Rhinitis, and Vitamin B 12 deficiency, retinal vein occlusion  Care Plan : CCM Pharmacy Care Plan  Updates made by [name], [place] since 02/13/2021 12:00 AM     Problem: Problem: Hypertension, Hyperlipidemia, Atrial Fibrillation, Allergic Rhinitis, and Vitamin B 12 deficiency, retinal vein occlusion      Long-Range Goal: Patient-Specific Goal   Start Date: 02/13/2021  Expected End Date: 02/13/2022  This Visit's Progress: On track  Priority: High  Note:   Current Barriers:  Unable to independently monitor therapeutic efficacy Unable to achieve control of triglycerides   Pharmacist Clinical Goal(s):  Patient will achieve adherence to  monitoring guidelines and medication adherence to achieve therapeutic efficacy achieve control of triglycerides as evidenced by next lipid panel  through collaboration with PharmD and provider.   Interventions: 1:1 collaboration with Laurey Morale, MD regarding development and update of comprehensive plan of care as evidenced by provider attestation and co-signature Inter-disciplinary care team collaboration (see longitudinal plan of care) Comprehensive medication review performed; medication list updated in electronic medical record  Hypertension (BP goal <140/90) -Controlled -Current treatment: Metoprolol succinate 25 mg 1 tablet daily Furosemide 20 mg 1 tablet every other day (or every 2-3 days) -Medications previously tried: n/a  -Current home readings: has a BP monitor at home but does not use -Current dietary habits: did not discuss -Current exercise habits: limited -Denies hypotensive/hypertensive symptoms -Educated on Proper BP monitoring technique; Symptoms of hypotension and importance of maintaining adequate hydration; -Counseled to monitor BP at home 1-2 times a month, document, and provide log at future appointments -Counseled on diet and exercise extensively Recommended to continue current medication  Hyperlipidemia: (LDL goal < 70) -Controlled -Current treatment:  Atorvastatin 40 mg 1 tablet daily   Fenofibrate 150 mg 1 capsule daily Ezetimibe 10 mg 1 tablet daily -Medications previously tried: none  -Current dietary patterns: did not discuss -Current exercise habits: limited -Educated on Cholesterol goals;  Importance of limiting foods high in cholesterol; Exercise goal of 150 minutes per week; -Counseled on diet and exercise extensively Recommended to continue current medication Consider dose increase of statin to lower triglycerides.  Atrial Fibrillation (Goal: prevent stroke and major bleeding) -Controlled -CHADSVASC: 5 -Current treatment: Rate control:  Metoprolol succinate 25 mg 1 tablet daily Anticoagulation: Warfarin 2.5 mg every day -Medications previously tried: none -Home BP and HR readings: does not check  -Counseled on increased risk of stroke due to Afib and benefits of anticoagulation for stroke prevention; importance of adherence to anticoagulant exactly as prescribed; avoidance of NSAIDs due to increased bleeding risk with anticoagulants; -Recommended to continue current medication  Swelling (Goal: minimize swelling) -Controlled -Current treatment  Furosemide 20 mg 1 tablet every other day (or every 2-3 days) -Medications previously tried: none  -Recommended to continue current medication Counseled on benefits of daily weights.  Allergic rhinitis (Goal: minimize symptoms) -Controlled -Current treatment  Flonase 50 mcg/act 2 sprays in each nostril as needed -Medications previously tried: none  -Recommended to continue current medication  Retinal vein occlusion (Goal: prevent damage to eye) -Controlled -Current treatment  Eylea injections every 6 - 8 weeks Tobramycin 0.3% instill 1 drop into the right eye 4 times daily prior to procedure -Medications previously tried: none  -Recommended to continue current medication   Health Maintenance -Vaccine gaps: shingrix, COVID booster -Current therapy:  Vitamin D 5000 units 1 capsule daily -Educated on Cost vs benefit of each product must be carefully weighed by individual consumer -Patient is satisfied with current therapy and denies issues -Recommended repeat vit B12 and vitamin D level to ensure adequate supplementation.  Patient Goals/Self-Care Activities Patient will:  - take medications as prescribed check blood pressure a few times a month, document, and provide at future appointments weigh daily, and contact provider if weight gain of > 3 lbs in one day or > 5 lbs in one week  Follow Up Plan: Telephone follow up appointment with care management team member  scheduled for: 1 year       Medication Assistance: None required.  Patient affirms current coverage meets needs.  Compliance/Adherence/Medication fill history: Care Gaps: Shingrix, COVID booster  Star-Rating Drugs: Atorvastatin 40 mg - last filled 11/13/20 for 90 ds at [facility]  Patient's preferred pharmacy is:  [facility] [address], [place] [address] Phone: [phone] Fax: [phone]  Uses pill box? Yes Pt endorses 99% compliance  We discussed: Current pharmacy is preferred with insurance plan and patient is satisfied with pharmacy services Patient decided to: Continue current medication management strategy  Care Plan and Follow Up Patient Decision:  Patient agrees to Care Plan and Follow-up.  Plan: Telephone follow up appointment with care management team member scheduled for:  1 year  Jeni Salles, PharmD, [place] Pharmacist [employer] at [place] [phone]

## 2021-02-27 DIAGNOSIS — H26493 Other secondary cataract, bilateral: Secondary | ICD-10-CM | POA: Diagnosis not present

## 2021-02-27 DIAGNOSIS — H34233 Retinal artery branch occlusion, bilateral: Secondary | ICD-10-CM | POA: Diagnosis not present

## 2021-02-27 DIAGNOSIS — H43813 Vitreous degeneration, bilateral: Secondary | ICD-10-CM | POA: Diagnosis not present

## 2021-02-27 DIAGNOSIS — H34833 Tributary (branch) retinal vein occlusion, bilateral, with macular edema: Secondary | ICD-10-CM | POA: Diagnosis not present

## 2021-03-05 ENCOUNTER — Other Ambulatory Visit: Payer: Self-pay

## 2021-03-05 ENCOUNTER — Ambulatory Visit (INDEPENDENT_AMBULATORY_CARE_PROVIDER_SITE_OTHER): Payer: PPO | Admitting: General Practice

## 2021-03-05 DIAGNOSIS — I4891 Unspecified atrial fibrillation: Secondary | ICD-10-CM | POA: Diagnosis not present

## 2021-03-05 DIAGNOSIS — Z7901 Long term (current) use of anticoagulants: Secondary | ICD-10-CM

## 2021-03-05 LAB — POCT INR: INR: 2.6 (ref 2.0–3.0)

## 2021-03-05 NOTE — Patient Instructions (Signed)
Pre visit review using our clinic review tool, if applicable. No additional management support is needed unless otherwise documented below in the visit note.  Continue to take 2.5 mg all days .  Re-check in 5 weeks.

## 2021-03-24 ENCOUNTER — Other Ambulatory Visit: Payer: Self-pay | Admitting: Family Medicine

## 2021-04-03 ENCOUNTER — Other Ambulatory Visit: Payer: Self-pay | Admitting: Family Medicine

## 2021-04-03 DIAGNOSIS — Z7901 Long term (current) use of anticoagulants: Secondary | ICD-10-CM

## 2021-04-03 NOTE — Telephone Encounter (Signed)
Please advise 

## 2021-04-04 ENCOUNTER — Telehealth: Payer: Self-pay

## 2021-04-04 NOTE — Telephone Encounter (Signed)
Wife of patient called requesting Rx refill warfarin (COUMADIN) 2.5 MG tablet

## 2021-04-04 NOTE — Telephone Encounter (Signed)
Refill was sent by Coumadin clinic to [facility] in [place].    Spoke with patient to make aware.

## 2021-04-04 NOTE — Telephone Encounter (Signed)
Pt is compliant with coumadin management.  Last OV with PCP 02/13/21 Sent in script

## 2021-04-09 ENCOUNTER — Other Ambulatory Visit: Payer: Self-pay

## 2021-04-09 ENCOUNTER — Ambulatory Visit (INDEPENDENT_AMBULATORY_CARE_PROVIDER_SITE_OTHER): Payer: PPO

## 2021-04-09 DIAGNOSIS — Z7901 Long term (current) use of anticoagulants: Secondary | ICD-10-CM | POA: Diagnosis not present

## 2021-04-09 LAB — POCT INR: INR: 2.3 (ref 2.0–3.0)

## 2021-04-09 MED ORDER — WARFARIN SODIUM 1 MG PO TABS: ORAL_TABLET | ORAL | 1 refills | Status: DC

## 2021-04-09 NOTE — Progress Notes (Signed)
Pt's wife reported she tried to pick up script for 2.'5mg'$  warfarin this morning and [facility] told her insurance would not pay because it was too early. Advised this nurse would contact pharmacy. Also advised a '1mg'$  warfarin tablet would also be called in. Pt's wife verbalized understanding and was appreciative.

## 2021-04-09 NOTE — Patient Instructions (Addendum)
Pre visit review using our clinic review tool, if applicable. No additional management support is needed unless otherwise documented below in the visit note.  Increase dose tomorrow to '3mg'$  and then continue to take 2.5 mg all days .  Re-check in 5 weeks.

## 2021-04-25 DIAGNOSIS — H34833 Tributary (branch) retinal vein occlusion, bilateral, with macular edema: Secondary | ICD-10-CM | POA: Diagnosis not present

## 2021-04-25 DIAGNOSIS — H43813 Vitreous degeneration, bilateral: Secondary | ICD-10-CM | POA: Diagnosis not present

## 2021-04-25 DIAGNOSIS — H3563 Retinal hemorrhage, bilateral: Secondary | ICD-10-CM | POA: Diagnosis not present

## 2021-04-25 DIAGNOSIS — H34233 Retinal artery branch occlusion, bilateral: Secondary | ICD-10-CM | POA: Diagnosis not present

## 2021-05-03 ENCOUNTER — Other Ambulatory Visit: Payer: Self-pay | Admitting: Internal Medicine

## 2021-05-14 ENCOUNTER — Other Ambulatory Visit: Payer: Self-pay

## 2021-05-14 ENCOUNTER — Ambulatory Visit (INDEPENDENT_AMBULATORY_CARE_PROVIDER_SITE_OTHER): Payer: PPO

## 2021-05-14 DIAGNOSIS — Z7901 Long term (current) use of anticoagulants: Secondary | ICD-10-CM | POA: Diagnosis not present

## 2021-05-14 LAB — POCT INR: INR: 2.7 (ref 2.0–3.0)

## 2021-05-14 NOTE — Patient Instructions (Addendum)
Pre visit review using our clinic review tool, if applicable. No additional management support is needed unless otherwise documented below in the visit note.  Continue to take 2.5 mg all days .  Re-check in 6 weeks.

## 2021-05-16 ENCOUNTER — Telehealth: Payer: Self-pay | Admitting: Pharmacist

## 2021-05-16 NOTE — Chronic Care Management (AMB) (Signed)
Chronic Care Management Pharmacy Assistant   Name: Justin Gallegos  MRN: [id number] DOB: Aug 01, 1929   Reason for Encounter: Disease State/ Hypertension Assessment Call.    Conditions to be addressed/monitored: HTN   Recent office visits:  05/14/21 Randall An RN [facility]) - seen for anticoagulation visit.   02/13/21 Alysia Penna MD (PCP) - seen for tinea corporis. Patient started on ketoconazole 2% daily. No follow up noted.   Recent consult visits:  None.  Hospital visits:  None in previous 6 months  Medications: Outpatient Encounter Medications as of 05/16/2021  Medication Sig   Aflibercept (EYLEA IO) Inject into the eye.   atorvastatin (LIPITOR) 40 MG tablet Take 1 tablet (40 mg total) by mouth daily.   Cholecalciferol (VITAMIN D) 125 MCG (5000 UT) CAPS Take 5,000 Units by mouth daily.    ezetimibe (ZETIA) 10 MG tablet Take 1 tablet (10 mg total) by mouth daily.   Fenofibrate 150 MG CAPS Take 1 capsule by mouth once daily   furosemide (LASIX) 20 MG tablet TAKE 1 TABLET BY MOUTH EVERY OTHER DAY   ketoconazole (NIZORAL) 2 % cream Apply 1 application topically daily.   metoprolol succinate (TOPROL-XL) 25 MG 24 hr tablet Take 1 tablet (25 mg total) by mouth daily.   tobramycin (TOBREX) 0.3 % ophthalmic solution 1 drop.    warfarin (COUMADIN) 1 MG tablet TAKE ONE TABLET BY MOUTH DAILY OR AS DIRECTED BY ANTICOAGULATION CLINIC   warfarin (COUMADIN) 2.5 MG tablet TAKE 1 TABLET BY MOUTH DAILY OR AS DIRECTED BY ANTICOAGULATION CLINIC   warfarin (COUMADIN) 5 MG tablet Take 1 tablet by mouth on Sunday and Tuesday or Take as directed by anticoagulation clinic   No facility-administered encounter medications on file as of 05/16/2021.   Fill History: ATORVASTATIN CALCIUM 40MG  TABLET 05/14/2021 90   EZETIMIBE 10MG  TABLET 05/14/2021 90   FENOFIBRATE 150MG    CAP 03/26/2021 90   FUROSEMIDE 20MG      TAB 05/03/2021 90   KETOCONAZOLE 2%     CRE 02/13/2021 30   METOPROLOL  SUCCINATE ER 25MG  TABLET EXTENDED RELEASE 24 HOUR 05/14/2021 90   WARFARIN 1MG  (ONE)    TAB 04/09/2021 30   Reviewed chart prior to disease state call. Spoke with patient regarding BP  Recent Office Vitals: BP Readings from Last 3 Encounters:  02/13/21 110/72  11/13/20 110/72  09/29/20 124/60   Pulse Readings from Last 3 Encounters:  02/13/21 92  11/13/20 95  09/29/20 93    Wt Readings from Last 3 Encounters:  02/13/21 202 lb (91.6 kg)  11/13/20 204 lb (92.5 kg)  09/29/20 203 lb 6.4 oz (92.3 kg)     Kidney Function Lab Results  Component Value Date/Time   CREATININE 1.64 (H) 09/29/2020 11:52 AM   CREATININE 1.90 (H) 12/30/2019 11:12 AM   GFR 33.53 (L) 12/30/2019 11:12 AM   GFRNONAA 37 (L) 09/29/2020 11:52 AM   GFRAA 42 (L) 09/29/2020 11:52 AM    BMP Latest Ref Rng & Units 09/29/2020 12/30/2019 05/29/2018  Glucose 65 - 99 mg/dL 129(H) 113(H) 124(H)  BUN 8 - 27 mg/dL 18 23 24(H)  Creatinine 0.76 - 1.27 mg/dL 1.64(H) 1.90(H) 1.77(H)  BUN/Creat Ratio 10 - 24 11 - -  Sodium 134 - 144 mmol/L 136 136 138  Potassium 3.5 - 5.2 mmol/L 4.4 4.5 4.3  Chloride 96 - 106 mmol/L 103 105 104  CO2 20 - 29 mmol/L 19(L) 24 27  Calcium 8.6 - 10.2 mg/dL 8.7 8.6 9.2  Current antihypertensive regimen:  Metoprolol succinate 25 mg 1 tablet daily Furosemide 20 mg 1 tablet every other day (or every 2-3 days) How often are you checking your Blood Pressure?  Current home BP readings:  What recent interventions/DTPs have been made by any provider to improve Blood Pressure control since last CPP Visit: None. Any recent hospitalizations or ED visits since last visit with CPP? No   Adherence Review: Is the patient currently on ACE/ARB medication? No Does the patient have >5 day gap between last estimated fill dates? No  Notes: Called to perform assessment call and patient and wife Justin Gallegos both stated they wish to un enroll patient from program. Wife stated they see PCP every 4-6 weeks already  and at 87 years old they don't see any benefit in program. I tried to fully explain program and benefits and Justin Gallegos continued to cut me off and hang up on me. Message sent to [facility] PTM to un enroll patient.   Care Gaps:  AWV - scheduled for 06/27/21 Zoster vaccines - never done Covid-19 vaccine booster 4 - overdue since 08/17/20 Flu vaccine - due  Star Rating Drugs:  Atorvastatin 40mg  - last filled on 05/14/21 90DS at [place] [phone]

## 2021-05-18 LAB — POCT INR: INR: 2.7 (ref 2.0–3.0)

## 2021-06-20 DIAGNOSIS — H34833 Tributary (branch) retinal vein occlusion, bilateral, with macular edema: Secondary | ICD-10-CM | POA: Diagnosis not present

## 2021-06-20 DIAGNOSIS — H3582 Retinal ischemia: Secondary | ICD-10-CM | POA: Diagnosis not present

## 2021-06-20 DIAGNOSIS — H34233 Retinal artery branch occlusion, bilateral: Secondary | ICD-10-CM | POA: Diagnosis not present

## 2021-06-20 DIAGNOSIS — H3563 Retinal hemorrhage, bilateral: Secondary | ICD-10-CM | POA: Diagnosis not present

## 2021-06-25 ENCOUNTER — Ambulatory Visit (INDEPENDENT_AMBULATORY_CARE_PROVIDER_SITE_OTHER): Payer: PPO

## 2021-06-25 DIAGNOSIS — Z7901 Long term (current) use of anticoagulants: Secondary | ICD-10-CM | POA: Diagnosis not present

## 2021-06-25 DIAGNOSIS — Z23 Encounter for immunization: Secondary | ICD-10-CM | POA: Diagnosis not present

## 2021-06-25 LAB — POCT INR: INR: 2 (ref 2.0–3.0)

## 2021-06-25 NOTE — Progress Notes (Signed)
Increase dose today to 1 1/2 tablets (3.75mg ) and increase dose tomorrow to 1 1/2 tablets (3.75mg ) and then continue to take 2.5 mg all days .  Re-check in 2 weeks.

## 2021-06-25 NOTE — Patient Instructions (Addendum)
Pre visit review using our clinic review tool, if applicable. No additional management support is needed unless otherwise documented below in the visit note.  Increase dose today to 1 1/2 tablets (3.75mg ) and increase dose tomorrow to 1 1/2 tablets (3.75mg ) and then continue to take 2.5 mg all days .  Re-check in 2 weeks.

## 2021-06-27 ENCOUNTER — Other Ambulatory Visit: Payer: Self-pay | Admitting: Family Medicine

## 2021-06-27 ENCOUNTER — Ambulatory Visit: Payer: PPO

## 2021-07-09 ENCOUNTER — Other Ambulatory Visit: Payer: Self-pay

## 2021-07-09 ENCOUNTER — Ambulatory Visit (INDEPENDENT_AMBULATORY_CARE_PROVIDER_SITE_OTHER): Payer: PPO

## 2021-07-09 DIAGNOSIS — Z7901 Long term (current) use of anticoagulants: Secondary | ICD-10-CM

## 2021-07-09 LAB — POCT INR: INR: 2.8 (ref 2.0–3.0)

## 2021-07-09 MED ORDER — WARFARIN SODIUM 2.5 MG PO TABS: ORAL_TABLET | ORAL | 1 refills | Status: DC

## 2021-07-09 NOTE — Progress Notes (Signed)
Continue to take 2.5 mg all days .  Re-check in 4-6 weeks.

## 2021-07-09 NOTE — Progress Notes (Signed)
Refill for warfarin sent in.

## 2021-07-09 NOTE — Patient Instructions (Addendum)
Pre visit review using our clinic review tool, if applicable. No additional management support is needed unless otherwise documented below in the visit note.  Continue to take 2.5 mg all days .  Re-check in 4-6 weeks.

## 2021-07-18 ENCOUNTER — Ambulatory Visit: Payer: PPO | Admitting: Internal Medicine

## 2021-08-20 ENCOUNTER — Ambulatory Visit (INDEPENDENT_AMBULATORY_CARE_PROVIDER_SITE_OTHER): Payer: PPO

## 2021-08-20 ENCOUNTER — Other Ambulatory Visit: Payer: Self-pay

## 2021-08-20 DIAGNOSIS — Z7901 Long term (current) use of anticoagulants: Secondary | ICD-10-CM

## 2021-08-20 LAB — POCT INR: INR: 2 (ref 2.0–3.0)

## 2021-08-20 NOTE — Patient Instructions (Addendum)
Pre visit review using our clinic review tool, if applicable. No additional management support is needed unless otherwise documented below in the visit note.  Increase dose today to 3.75 mg and increase dose tomorrow to 3.75 mg and then continue to take 2.5 mg all days .  Re-check in 2 weeks.

## 2021-08-20 NOTE — Progress Notes (Signed)
Increase dose today to 3.75 mg and increase dose tomorrow to 3.75 mg and then continue to take 2.5 mg all days .  Re-check in 2 weeks.

## 2021-08-21 DIAGNOSIS — H3582 Retinal ischemia: Secondary | ICD-10-CM | POA: Diagnosis not present

## 2021-08-21 DIAGNOSIS — H43813 Vitreous degeneration, bilateral: Secondary | ICD-10-CM | POA: Diagnosis not present

## 2021-08-21 DIAGNOSIS — H34233 Retinal artery branch occlusion, bilateral: Secondary | ICD-10-CM | POA: Diagnosis not present

## 2021-08-21 DIAGNOSIS — H34833 Tributary (branch) retinal vein occlusion, bilateral, with macular edema: Secondary | ICD-10-CM | POA: Diagnosis not present

## 2021-09-03 ENCOUNTER — Ambulatory Visit (INDEPENDENT_AMBULATORY_CARE_PROVIDER_SITE_OTHER): Payer: PPO

## 2021-09-03 DIAGNOSIS — Z7901 Long term (current) use of anticoagulants: Secondary | ICD-10-CM | POA: Diagnosis not present

## 2021-09-03 LAB — POCT INR: INR: 2.2 (ref 2.0–3.0)

## 2021-09-03 NOTE — Progress Notes (Addendum)
Increase dose today to 3.75 mg and then continue to take 2.5 mg daily except take 3.75 mg on Wednesdays .  Re-check in 4 weeks.

## 2021-09-03 NOTE — Patient Instructions (Addendum)
Pre visit review using our clinic review tool, if applicable. No additional management support is needed unless otherwise documented below in the visit note.  Increase dose today to 3.75 mg and then continue to take 2.5 mg daily except take 3.75 mg on Wednesdays .  Re-check in 4 weeks.

## 2021-09-24 ENCOUNTER — Ambulatory Visit: Payer: PPO | Admitting: Family Medicine

## 2021-09-25 ENCOUNTER — Ambulatory Visit (INDEPENDENT_AMBULATORY_CARE_PROVIDER_SITE_OTHER): Payer: PPO | Admitting: Family Medicine

## 2021-09-25 ENCOUNTER — Encounter: Payer: Self-pay | Admitting: Family Medicine

## 2021-09-25 VITALS — BP 108/70 | HR 99 | Temp 97.5°F | Wt 202.0 lb

## 2021-09-25 DIAGNOSIS — H6122 Impacted cerumen, left ear: Secondary | ICD-10-CM | POA: Diagnosis not present

## 2021-09-25 NOTE — Progress Notes (Signed)
° °  Subjective:    Patient ID: Justin Gallegos, male    DOB: 06-04-1929, 88 y.o.   MRN: [id number]  HPI Here with his wife to check his ears. She thinks his hearing has decreased and he has a hx of wax buildups. No ear pain.    Review of Systems  Constitutional: Negative.   HENT:  Positive for hearing loss.   Respiratory: Negative.    Cardiovascular: Negative.       Objective:   Physical Exam Constitutional:      Appearance: Normal appearance.  HENT:     Right Ear: Tympanic membrane, ear canal and external ear normal.     Left Ear: There is impacted cerumen.  Cardiovascular:     Rate and Rhythm: Normal rate and regular rhythm.     Pulses: Normal pulses.     Heart sounds: Normal heart sounds.  Pulmonary:     Effort: Pulmonary effort is normal.     Breath sounds: Normal breath sounds.  Neurological:     Mental Status: He is alert.          Assessment & Plan:  Cerumen impaction. After obtaining informed consent, we attempted to irrigate this with water but we were not successful. He tolerated the procedure well, but the cerumen was still deep in the canal. We will refer him to ENT for this. Alysia Penna, MD

## 2021-10-01 ENCOUNTER — Ambulatory Visit (INDEPENDENT_AMBULATORY_CARE_PROVIDER_SITE_OTHER): Payer: PPO

## 2021-10-01 DIAGNOSIS — Z7901 Long term (current) use of anticoagulants: Secondary | ICD-10-CM

## 2021-10-01 LAB — POCT INR: INR: 2.9 (ref 2.0–3.0)

## 2021-10-01 MED ORDER — WARFARIN SODIUM 2.5 MG PO TABS: ORAL_TABLET | ORAL | 1 refills | Status: DC

## 2021-10-01 NOTE — Progress Notes (Signed)
Continue to take 2.5 mg  except take 3.75 mg on Wednesdays. Re-check in 4 weeks.

## 2021-10-01 NOTE — Patient Instructions (Addendum)
Pre visit review using our clinic review tool, if applicable. No additional management support is needed unless otherwise documented below in the visit note.  Continue to take 2.5 mg  except take 3.75 mg on Wednesdays. Re-check in 4 weeks.

## 2021-10-05 ENCOUNTER — Other Ambulatory Visit: Payer: Self-pay | Admitting: Family Medicine

## 2021-10-16 DIAGNOSIS — H35362 Drusen (degenerative) of macula, left eye: Secondary | ICD-10-CM | POA: Diagnosis not present

## 2021-10-16 DIAGNOSIS — H43813 Vitreous degeneration, bilateral: Secondary | ICD-10-CM | POA: Diagnosis not present

## 2021-10-16 DIAGNOSIS — H3582 Retinal ischemia: Secondary | ICD-10-CM | POA: Diagnosis not present

## 2021-10-16 DIAGNOSIS — H34233 Retinal artery branch occlusion, bilateral: Secondary | ICD-10-CM | POA: Diagnosis not present

## 2021-10-16 DIAGNOSIS — H34833 Tributary (branch) retinal vein occlusion, bilateral, with macular edema: Secondary | ICD-10-CM | POA: Diagnosis not present

## 2021-10-18 ENCOUNTER — Telehealth: Payer: Self-pay | Admitting: Family Medicine

## 2021-10-18 NOTE — Telephone Encounter (Signed)
Left message for patient to call back and schedule Medicare Annual Wellness Visit (AWV) either virtually or in office. Left  my jabber number [phone]   Last AWV 06/26/20 ; please schedule at anytime with [facility] Nurse Health Advisor 1 or 2   This should be a 45 minute visit.  

## 2021-10-29 ENCOUNTER — Ambulatory Visit (INDEPENDENT_AMBULATORY_CARE_PROVIDER_SITE_OTHER): Payer: PPO

## 2021-10-29 DIAGNOSIS — Z7901 Long term (current) use of anticoagulants: Secondary | ICD-10-CM

## 2021-10-29 LAB — POCT INR: INR: 2.5 (ref 2.0–3.0)

## 2021-10-29 NOTE — Progress Notes (Signed)
Continue to take 2.5 mg  except take 3.75 mg on Wednesdays. Re-check in 6 weeks. ?

## 2021-10-29 NOTE — Patient Instructions (Addendum)
Pre visit review using our clinic review tool, if applicable. No additional management support is needed unless otherwise documented below in the visit note. ? ?Continue to take 2.5 mg  except take 3.75 mg on Wednesdays. Re-check in 6 weeks. ?

## 2021-11-05 ENCOUNTER — Telehealth: Payer: Self-pay | Admitting: Family Medicine

## 2021-11-05 NOTE — Telephone Encounter (Signed)
Pt wife Raul Del is calling and fenofibrate 150 mg is not longer on formulary however 200 mg and 134 mg capsules or on formulary as well as tablet 160 mg and 145 mg  ?

## 2021-11-08 ENCOUNTER — Other Ambulatory Visit: Payer: Self-pay

## 2021-11-08 MED ORDER — FENOFIBRATE 145 MG PO TABS: 145.0000 mg | ORAL_TABLET | Freq: Every day | ORAL | 3 refills | Status: DC

## 2021-11-08 NOTE — Telephone Encounter (Signed)
Change the Fenofibrate to 145 mg tablets to take one per day. Call in a one year supply  ?

## 2021-11-08 NOTE — Telephone Encounter (Signed)
Rx changed and sent to pt pharmacy, left detailed message on pt wife mobile number ?

## 2021-11-12 ENCOUNTER — Telehealth: Payer: Self-pay | Admitting: Family Medicine

## 2021-11-12 NOTE — Telephone Encounter (Signed)
Tried calling patient to schedule Medicare Annual Wellness Visit (AWV) either virtually or in office.  ? ?No answer  ? ?Last AWV ;06/26/20 ? please schedule at anytime with [facility] Nurse Health Advisor 1 or 2 ? ?  ?

## 2021-11-15 ENCOUNTER — Ambulatory Visit: Payer: PPO | Admitting: Internal Medicine

## 2021-11-20 ENCOUNTER — Other Ambulatory Visit: Payer: Self-pay | Admitting: Family Medicine

## 2021-11-22 ENCOUNTER — Other Ambulatory Visit: Payer: Self-pay | Admitting: Family Medicine

## 2021-11-22 DIAGNOSIS — E782 Mixed hyperlipidemia: Secondary | ICD-10-CM

## 2021-11-26 ENCOUNTER — Ambulatory Visit (INDEPENDENT_AMBULATORY_CARE_PROVIDER_SITE_OTHER): Payer: PPO

## 2021-11-26 VITALS — Ht 71.0 in | Wt 200.0 lb

## 2021-11-26 DIAGNOSIS — Z0000 Encounter for general adult medical examination without abnormal findings: Secondary | ICD-10-CM

## 2021-11-26 NOTE — Patient Instructions (Signed)
Justin Gallegos , ?Thank you for taking time to come for your Medicare Wellness Visit. I appreciate your ongoing commitment to your health goals. Please review the following plan we discussed and let me know if I can assist you in the future.  ? ?Screening recommendations/referrals: ?Colonoscopy: not required ?Recommended yearly ophthalmology/optometry visit for glaucoma screening and checkup ?Recommended yearly dental visit for hygiene and checkup ? ?Vaccinations: ?Influenza vaccine: due 03/12/2022 ?Pneumococcal vaccine: completed 05/11/2014 ?Tdap vaccine: completed 11/13/2020, due 11/14/2030 ?Shingles vaccine: discussed   ?Covid-19:  05/25/2020, 10/25/2019, 10/04/2019 ? ?Advanced directives: Advance directive discussed with you today.  ? ?Conditions/risks identified: none ? ?Next appointment: Follow up in one year for your annual wellness visit.  ? ?Preventive Care 86 Years and Older, Male ?Preventive care refers to lifestyle choices and visits with your health care provider that can promote health and wellness. ?What does preventive care include? ?A yearly physical exam. This is also called an annual well check. ?Dental exams once or twice a year. ?Routine eye exams. Ask your health care provider how often you should have your eyes checked. ?Personal lifestyle choices, including: ?Daily care of your teeth and gums. ?Regular physical activity. ?Eating a healthy diet. ?Avoiding tobacco and drug use. ?Limiting alcohol use. ?Practicing safe sex. ?Taking low doses of aspirin every day. ?Taking vitamin and mineral supplements as recommended by your health care provider. ?What happens during an annual well check? ?The services and screenings done by your health care provider during your annual well check will depend on your age, overall health, lifestyle risk factors, and family history of disease. ?Counseling  ?Your health care provider may ask you questions about your: ?Alcohol use. ?Tobacco use. ?Drug use. ?Emotional  well-being. ?Home and relationship well-being. ?Sexual activity. ?Eating habits. ?History of falls. ?Memory and ability to understand (cognition). ?Work and work Statistician. ?Screening  ?You may have the following tests or measurements: ?Height, weight, and BMI. ?Blood pressure. ?Lipid and cholesterol levels. These may be checked every 5 years, or more frequently if you are over 60 years old. ?Skin check. ?Lung cancer screening. You may have this screening every year starting at age 34 if you have a 30-pack-year history of smoking and currently smoke or have quit within the past 15 years. ?Fecal occult blood test (FOBT) of the stool. You may have this test every year starting at age 26. ?Flexible sigmoidoscopy or colonoscopy. You may have a sigmoidoscopy every 5 years or a colonoscopy every 10 years starting at age 49. ?Prostate cancer screening. Recommendations will vary depending on your family history and other risks. ?Hepatitis C blood test. ?Hepatitis B blood test. ?Sexually transmitted disease (STD) testing. ?Diabetes screening. This is done by checking your blood sugar (glucose) after you have not eaten for a while (fasting). You may have this done every 1-3 years. ?Abdominal aortic aneurysm (AAA) screening. You may need this if you are a current or former smoker. ?Osteoporosis. You may be screened starting at age 19 if you are at high risk. ?Talk with your health care provider about your test results, treatment options, and if necessary, the need for more tests. ?Vaccines  ?Your health care provider may recommend certain vaccines, such as: ?Influenza vaccine. This is recommended every year. ?Tetanus, diphtheria, and acellular pertussis (Tdap, Td) vaccine. You may need a Td booster every 10 years. ?Zoster vaccine. You may need this after age 56. ?Pneumococcal 13-valent conjugate (PCV13) vaccine. One dose is recommended after age 62. ?Pneumococcal polysaccharide (PPSV23) vaccine. One dose is recommended  after  age 33. ?Talk to your health care provider about which screenings and vaccines you need and how often you need them. ?This information is not intended to replace advice given to you by your health care provider. Make sure you discuss any questions you have with your health care provider. ?Document Released: 08/25/2015 Document Revised: 04/17/2016 Document Reviewed: 05/30/2015 ?Elsevier Interactive Patient Education ? 2017 [place]. ? ?Fall Prevention in the Home ?Falls can cause injuries. They can happen to people of all ages. There are many things you can do to make your home safe and to help prevent falls. ?What can I do on the outside of my home? ?Regularly fix the edges of walkways and driveways and fix any cracks. ?Remove anything that might make you trip as you walk through a door, such as a raised step or threshold. ?Trim any bushes or trees on the path to your home. ?Use bright outdoor lighting. ?Clear any walking paths of anything that might make someone trip, such as rocks or tools. ?Regularly check to see if handrails are loose or broken. Make sure that both sides of any steps have handrails. ?Any raised decks and porches should have guardrails on the edges. ?Have any leaves, snow, or ice cleared regularly. ?Use sand or salt on walking paths during winter. ?Clean up any spills in your garage right away. This includes oil or grease spills. ?What can I do in the bathroom? ?Use night lights. ?Install grab bars by the toilet and in the tub and shower. Do not use towel bars as grab bars. ?Use non-skid mats or decals in the tub or shower. ?If you need to sit down in the shower, use a plastic, non-slip stool. ?Keep the floor dry. Clean up any water that spills on the floor as soon as it happens. ?Remove soap buildup in the tub or shower regularly. ?Attach bath mats securely with double-sided non-slip rug tape. ?Do not have throw rugs and other things on the floor that can make you trip. ?What can I do in the  bedroom? ?Use night lights. ?Make sure that you have a light by your bed that is easy to reach. ?Do not use any sheets or blankets that are too big for your bed. They should not hang down onto the floor. ?Have a firm chair that has side arms. You can use this for support while you get dressed. ?Do not have throw rugs and other things on the floor that can make you trip. ?What can I do in the kitchen? ?Clean up any spills right away. ?Avoid walking on wet floors. ?Keep items that you use a lot in easy-to-reach places. ?If you need to reach something above you, use a strong step stool that has a grab bar. ?Keep electrical cords out of the way. ?Do not use floor polish or wax that makes floors slippery. If you must use wax, use non-skid floor wax. ?Do not have throw rugs and other things on the floor that can make you trip. ?What can I do with my stairs? ?Do not leave any items on the stairs. ?Make sure that there are handrails on both sides of the stairs and use them. Fix handrails that are broken or loose. Make sure that handrails are as long as the stairways. ?Check any carpeting to make sure that it is firmly attached to the stairs. Fix any carpet that is loose or worn. ?Avoid having throw rugs at the top or bottom of the stairs. If you  do have throw rugs, attach them to the floor with carpet tape. ?Make sure that you have a light switch at the top of the stairs and the bottom of the stairs. If you do not have them, ask someone to add them for you. ?What else can I do to help prevent falls? ?Wear shoes that: ?Do not have high heels. ?Have rubber bottoms. ?Are comfortable and fit you well. ?Are closed at the toe. Do not wear sandals. ?If you use a stepladder: ?Make sure that it is fully opened. Do not climb a closed stepladder. ?Make sure that both sides of the stepladder are locked into place. ?Ask someone to hold it for you, if possible. ?Clearly mark and make sure that you can see: ?Any grab bars or  handrails. ?First and last steps. ?Where the edge of each step is. ?Use tools that help you move around (mobility aids) if they are needed. These include: ?Canes. ?Walkers. ?Scooters. ?Crutches. ?Turn on the lights when you g

## 2021-11-26 NOTE — Progress Notes (Signed)
?I connected with Justin Gallegos today by telephone and verified that I am speaking with the correct person using two identifiers. ?Location patient: home ?Location provider: work ?Persons participating in the virtual visit: [name], [name] LPN. ?  ?I discussed the limitations, risks, security and privacy concerns of performing an evaluation and management service by telephone and the availability of in person appointments. I also discussed with the patient that there may be a patient responsible charge related to this service. The patient expressed understanding and verbally consented to this telephonic visit.  ?  ?Interactive audio and video telecommunications were attempted between this provider and patient, however failed, due to patient having technical difficulties OR patient did not have access to video capability.  We continued and completed visit with audio only. ? ?  ? ?Vital signs may be patient reported or missing. ? ?Subjective:  ? Justin Gallegos is a 88 y.o. male who presents for Medicare Annual/Subsequent preventive examination. ? ?Review of Systems    ? ?Cardiac Risk Factors include: advanced age (>16mn, >>14women);dyslipidemia;hypertension;male gender ? ?   ?Objective:  ?  ?Today's Vitals  ? 11/26/21 1056  ?Weight: 200 lb (90.7 kg)  ?Height: '5\' 11"'$  (1.803 m)  ? ?Body mass index is 27.89 kg/m?. ? ? ?  11/26/2021  ? 11:06 AM 06/26/2020  ? 10:38 AM 10/26/2018  ?  2:14 PM 10/08/2017  ?  1:30 PM  ?Advanced Directives  ?Does Patient Have a Medical Advance Directive? No No No No  ?Would patient like information on creating a medical advance directive?  No - Patient declined Yes (MAU/Ambulatory/Procedural Areas - Information given)   ? ? ?Current Medications (verified) ?Outpatient Encounter Medications as of 11/26/2021  ?Medication Sig  ? Aflibercept (EYLEA IO) Inject into the eye.  ? atorvastatin (LIPITOR) 40 MG tablet Take 1 tablet (40 mg total) by mouth daily. *labs required for future  refills*  ? Cholecalciferol (VITAMIN D) 125 MCG (5000 UT) CAPS Take 5,000 Units by mouth daily.   ? ezetimibe (ZETIA) 10 MG tablet Take 1 tablet (10 mg total) by mouth daily. *Physical with labs required for greater quality of refills  ? fenofibrate (TRICOR) 145 MG tablet Take 1 tablet (145 mg total) by mouth daily.  ? ketoconazole (NIZORAL) 2 % cream Apply 1 application topically daily.  ? metoprolol succinate (TOPROL-XL) 25 MG 24 hr tablet Take 1 tablet by mouth once daily  ? warfarin (COUMADIN) 1 MG tablet TAKE ONE TABLET BY MOUTH DAILY OR AS DIRECTED BY ANTICOAGULATION CLINIC  ? warfarin (COUMADIN) 2.5 MG tablet TAKE 1 TABLET BY MOUTH DAILY EXCEPT TAKE 1 1/2 TABLET ON WEDNESDAYS OR AS DIRECTED BY ANTICOAGULATION CLINIC  ? warfarin (COUMADIN) 5 MG tablet Take 1 tablet by mouth on Sunday and Tuesday or Take as directed by anticoagulation clinic  ? furosemide (LASIX) 20 MG tablet TAKE 1 TABLET BY MOUTH EVERY OTHER DAY (Patient not taking: Reported on 11/26/2021)  ? tobramycin (TOBREX) 0.3 % ophthalmic solution 1 drop.  (Patient not taking: Reported on 11/26/2021)  ? ?No facility-administered encounter medications on file as of 11/26/2021.  ? ? ?Allergies (verified) ?Penicillins and Amoxicillin-pot clavulanate  ? ?History: ?Past Medical History:  ?Diagnosis Date  ? Arthritis   ? Atrial fibrillation ([facility]   ? sees Dr. PDorris Carnes  ? Cancer ([facility]   ? ureteral, per Dr. BAlinda Money  ? Eye disease   ? sees Dr. JSherlynn Stalls  ? Hemorrhoids   ? Hyperlipidemia   ?  Hypertension   ? Low back pain   ? TIA (transient ischemic attack)   ? ?Past Surgical History:  ?Procedure Laterality Date  ? APPENDECTOMY    ? CATARACT EXTRACTION, BILATERAL  2013  ? per [name]   ? COLONOSCOPY  06-28-10  ? per [name], benign polyps and severe diverticulosis, no repeats needed   ? HERNIA REPAIR    ? NEPHRECTOMY    ? TONSILLECTOMY    ? URETERECTOMY    ? ?Family History  ?Problem Relation Age of Onset  ? Stroke Father   ? Coronary artery disease  Other   ?     fhx  ? Stroke Other   ?     fhx  ? ?Social History  ? ?Socioeconomic History  ? Marital status: Married  ?  Spouse name: Not on file  ? Number of children: 9  ? Years of education: Not on file  ? Highest education level: Not on file  ?Occupational History  ? Occupation: Geographical information systems officer for [employer]  ?  Comment: retired   ?Tobacco Use  ? Smoking status: Never  ? Smokeless tobacco: Never  ?Vaping Use  ? Vaping Use: Never used  ?Substance and Sexual Activity  ? Alcohol use: No  ?  Alcohol/week: 0.0 standard drinks  ? Drug use: No  ? Sexual activity: Not on file  ?Other Topics Concern  ? Not on file  ?Social History Narrative  ? Lives with wife on one level house  ? Has nine children, (two local), with 24 grandchildren and 5 great grandchildren  ? Works with Physiological scientist 2 days/week at [employer]  ? Attends church with wife, who currently drives him everywhere d/t poor vision  ? ?Social Determinants of Health  ? ?Financial Resource Strain: Low Risk   ? Difficulty of Paying Living Expenses: Not hard at all  ?Food Insecurity: No Food Insecurity  ? Worried About Charity fundraiser in the Last Year: Never true  ? Ran Out of Food in the Last Year: Never true  ?Transportation Needs: No Transportation Needs  ? Lack of Transportation (Medical): No  ? Lack of Transportation (Non-Medical): No  ?Physical Activity: Insufficiently Active  ? Days of Exercise per Week: 2 days  ? Minutes of Exercise per Session: 30 min  ?Stress: No Stress Concern Present  ? Feeling of Stress : Not at all  ?Social Connections: Not on file  ? ? ?Tobacco Counseling ?Counseling given: Not Answered ? ? ?Clinical Intake: ? ?Pre-visit preparation completed: Yes ? ?Pain : No/denies pain ? ?  ? ?Nutritional Status: BMI 25 -29 Overweight ?Nutritional Risks: None ?Diabetes: No ? ?How often do you need to have someone help you when you read instructions, pamphlets, or other written materials from your doctor or pharmacy?: 1 -  Never ?What is the last grade level you completed in school?: BS degree ? ?Diabetic? no ? ?Interpreter Needed?: No ? ?Information entered by :: NAllen LPN ? ? ?Activities of Daily Living ? ?  11/26/2021  ? 11:08 AM  ?In your present state of health, do you have any difficulty performing the following activities:  ?Hearing? 0  ?Vision? 1  ?Difficulty concentrating or making decisions? 1  ?Walking or climbing stairs? 0  ?Dressing or bathing? 0  ?Doing errands, shopping? 1  ?Preparing Food and eating ? Y  ?Using the Toilet? N  ?In the past six months, have you accidently leaked urine? N  ?Do you have problems with  loss of bowel control? N  ?Managing your Medications? Y  ?Comment wife sets up  ?Managing your Finances? N  ?Housekeeping or managing your Housekeeping? Y  ? ? ?Patient Care Team: ?Laurey Morale, MD as PCP - General ?Fay Records, MD as PCP - Cardiology (Cardiology) ?Sherlynn Stalls, MD as Consulting Physician (Ophthalmology) ?[name], [facility] as Pharmacist (Pharmacist) ? ?Indicate any recent Medical Services you may have received from other than [facility] providers in the past year (date may be approximate). ? ?   ?Assessment:  ? This is a routine wellness examination for [place]. ? ?Hearing/Vision screen ?Vision Screening - Comments:: Regular eye exams, Dr. Baird Cancer ? ?Dietary issues and exercise activities discussed: ?Current Exercise Habits: Structured exercise class, Type of exercise: calisthenics, Time (Minutes): 30, Frequency (Times/Week): 2, Weekly Exercise (Minutes/Week): 60 ? ? Goals Addressed   ? ?  ?  ?  ?  ? This Visit's Progress  ?  Patient Stated     ?  11/26/2021, no goals ?  ? ?  ? ?Depression Screen ? ?  11/26/2021  ? 11:08 AM 09/25/2021  ?  3:34 PM 06/26/2020  ? 10:41 AM 10/26/2018  ?  2:15 PM 10/08/2017  ?  1:37 PM 08/25/2015  ?  8:38 AM 07/14/2014  ? 11:13 AM  ?PHQ 2/9 Scores  ?PHQ - 2 Score 0 0 0 0 0 0 0  ?PHQ- 9 Score  0 0 0     ?  ?Fall Risk ? ?  11/26/2021  ? 11:07 AM 09/25/2021  ?  3:34 PM  06/26/2020  ? 10:39 AM 10/26/2018  ?  2:15 PM 10/08/2017  ?  1:37 PM  ?Fall Risk   ?Falls in the past year? 0 1 0 1 No  ?Number falls in past yr: 0 0 0 1   ?Injury with Fall? 0 0 0    ?Risk for fall due to : Im

## 2021-12-10 ENCOUNTER — Ambulatory Visit (INDEPENDENT_AMBULATORY_CARE_PROVIDER_SITE_OTHER): Payer: PPO

## 2021-12-10 DIAGNOSIS — Z7901 Long term (current) use of anticoagulants: Secondary | ICD-10-CM

## 2021-12-10 LAB — POCT INR: INR: 1.7 — AB (ref 2.0–3.0)

## 2021-12-10 NOTE — Patient Instructions (Addendum)
Pre visit review using our clinic review tool, if applicable. No additional management support is needed unless otherwise documented below in the visit note. ? ?Increase dose today to take 1 1/2 tablets and increase dose tomorrow to take 1 1/2 tablets and then continue to take 2.5 mg  except take 3.75 mg on Wednesdays. Re-check in 2 weeks. ?

## 2021-12-10 NOTE — Progress Notes (Signed)
Increase dose today to take 1 1/2 tablets and increase dose tomorrow to take 1 1/2 tablets and then continue to take 2.5 mg  except take 3.75 mg on Wednesdays. Re-check in 2 weeks. ?

## 2021-12-12 DIAGNOSIS — H34833 Tributary (branch) retinal vein occlusion, bilateral, with macular edema: Secondary | ICD-10-CM | POA: Diagnosis not present

## 2021-12-12 DIAGNOSIS — H3582 Retinal ischemia: Secondary | ICD-10-CM | POA: Diagnosis not present

## 2021-12-12 DIAGNOSIS — H34233 Retinal artery branch occlusion, bilateral: Secondary | ICD-10-CM | POA: Diagnosis not present

## 2021-12-12 DIAGNOSIS — H43813 Vitreous degeneration, bilateral: Secondary | ICD-10-CM | POA: Diagnosis not present

## 2021-12-22 ENCOUNTER — Other Ambulatory Visit: Payer: Self-pay | Admitting: Family Medicine

## 2021-12-22 DIAGNOSIS — E782 Mixed hyperlipidemia: Secondary | ICD-10-CM

## 2021-12-24 ENCOUNTER — Ambulatory Visit (INDEPENDENT_AMBULATORY_CARE_PROVIDER_SITE_OTHER): Payer: PPO

## 2021-12-24 DIAGNOSIS — Z7901 Long term (current) use of anticoagulants: Secondary | ICD-10-CM

## 2021-12-24 LAB — POCT INR: INR: 1.7 — AB (ref 2.0–3.0)

## 2021-12-24 NOTE — Progress Notes (Signed)
Increase dose today to take 1 1/2 tablets and increase dose tomorrow to take 1 1/2 tablets and then change weekly dose to take 1 tablet daily except take 1 1/2 tablets on Mondays, Wednesdays and Fridays. Re-check in 2 weeks. ?

## 2021-12-24 NOTE — Patient Instructions (Addendum)
Pre visit review using our clinic review tool, if applicable. No additional management support is needed unless otherwise documented below in the visit note. ? ?Increase dose today to take 1 1/2 tablets and increase dose tomorrow to take 1 1/2 tablets and then change weekly dose to take 1 tablet daily except take 1 1/2 tablets on Mondays, Wednesdays and Fridays. Re-check in 2 weeks. ?

## 2021-12-24 NOTE — Telephone Encounter (Signed)
Last OV- 09/29/21 ?Last Lipid labs- 09/25/20 ? ?*Should lab appointment be scheduled?  Can patient receive refill? ? ?

## 2021-12-25 ENCOUNTER — Encounter: Payer: Self-pay | Admitting: Family Medicine

## 2021-12-25 ENCOUNTER — Ambulatory Visit (INDEPENDENT_AMBULATORY_CARE_PROVIDER_SITE_OTHER): Payer: PPO | Admitting: Family Medicine

## 2021-12-25 VITALS — BP 112/76 | HR 87 | Temp 98.0°F | Wt 205.0 lb

## 2021-12-25 DIAGNOSIS — H6123 Impacted cerumen, bilateral: Secondary | ICD-10-CM

## 2021-12-25 NOTE — Progress Notes (Signed)
? ?  Subjective:  ? ? Patient ID: Justin Gallegos, male    DOB: Apr 06, 1929, 88 y.o.   MRN: [id number] ? ?HPI ?Here to have his ears irrigated. He has frequent cerumen impactions. He now has decreased hearing on both sides. No ear pain. ? ? ? ?Review of Systems  ?Constitutional: Negative.   ?HENT:  Positive for hearing loss. Negative for congestion, ear pain and sinus pressure.   ?Eyes: Negative.   ?Respiratory: Negative.    ? ?   ?Objective:  ? Physical Exam ?Constitutional:   ?   Appearance: Normal appearance.  ?HENT:  ?   Right Ear: There is impacted cerumen.  ?   Left Ear: There is impacted cerumen.  ?Cardiovascular:  ?   Rate and Rhythm: Normal rate and regular rhythm.  ?   Pulses: Normal pulses.  ?   Heart sounds: Normal heart sounds.  ?Pulmonary:  ?   Effort: Pulmonary effort is normal.  ?   Breath sounds: Normal breath sounds.  ?Neurological:  ?   Mental Status: He is alert.  ? ? ? ? ? ?   ?Assessment & Plan:  ?Cerumen impactions. After informed consent was obtained, both ear canals were irrigated with water. He tolerated this well. However this was not successful. We will refer him to ENT to clean the canals out completely.  ?Alysia Penna, MD ? ? ?

## 2022-01-09 ENCOUNTER — Ambulatory Visit (INDEPENDENT_AMBULATORY_CARE_PROVIDER_SITE_OTHER): Payer: PPO

## 2022-01-09 DIAGNOSIS — Z7901 Long term (current) use of anticoagulants: Secondary | ICD-10-CM

## 2022-01-09 LAB — POCT INR: INR: 3.4 — AB (ref 2.0–3.0)

## 2022-01-09 NOTE — Progress Notes (Signed)
Hold dose and then change weekly dose to take 1 tablet daily except take 1 1/2 tablets on Mondays and Thursday. Re-check in 3 weeks.

## 2022-01-09 NOTE — Patient Instructions (Addendum)
Pre visit review using our clinic review tool, if applicable. No additional management support is needed unless otherwise documented below in the visit note.  Hold dose and then change weekly dose to take 1 tablet daily except take 1 1/2 tablets on Mondays and Thursday. Re-check in 3 weeks.

## 2022-01-28 ENCOUNTER — Encounter: Payer: Self-pay | Admitting: Family Medicine

## 2022-01-28 ENCOUNTER — Ambulatory Visit (INDEPENDENT_AMBULATORY_CARE_PROVIDER_SITE_OTHER): Payer: PPO | Admitting: Family Medicine

## 2022-01-28 VITALS — BP 110/70 | HR 94 | Temp 98.7°F | Wt 202.0 lb

## 2022-01-28 DIAGNOSIS — I872 Venous insufficiency (chronic) (peripheral): Secondary | ICD-10-CM | POA: Diagnosis not present

## 2022-01-28 DIAGNOSIS — R600 Localized edema: Secondary | ICD-10-CM | POA: Diagnosis not present

## 2022-01-28 MED ORDER — FUROSEMIDE 20 MG PO TABS: ORAL_TABLET | ORAL | 5 refills | Status: DC

## 2022-01-28 MED ORDER — TRIAMCINOLONE ACETONIDE 0.1 % EX CREA: 1.0000 | TOPICAL_CREAM | Freq: Two times a day (BID) | CUTANEOUS | 5 refills | Status: DC

## 2022-01-28 NOTE — Progress Notes (Signed)
   Subjective:    Patient ID: Justin Gallegos, male    DOB: [birth year], 88 y.o.   MRN: [id number]  HPI Here for the rather sudden worsening of swelling in the legs over the past 2 weeks. No recent medication or diet changes. No SOB. He had been taking Lasix until they stopped this a year ago because his frequent trips to the bathroom to urinate were a problem. He had an ECHO in August 2012 which showed normal LV function. He has not had his renal function checked in over a year. Also in the past 2 weeks they have noticed a  red discoloration on both lower legs. This is not painful nor does it itch.    Review of Systems  Constitutional: Negative.   Respiratory: Negative.    Cardiovascular:  Positive for leg swelling. Negative for chest pain and palpitations.  Skin:  Positive for color change.       Objective:   Physical Exam Constitutional:      Comments: Walks slowly with a cane   Cardiovascular:     Rate and Rhythm: Normal rate and regular rhythm.     Pulses: Normal pulses.     Heart sounds: Normal heart sounds.  Pulmonary:     Effort: Pulmonary effort is normal.     Breath sounds: Normal breath sounds. No rales.  Musculoskeletal:     Comments: 2+ edema in both lower legs.   Skin:    Comments: The anterior of both lower legs have 5-7 cm patches of dusky red slightly raised skin. No warmth or tenderness  Neurological:     Mental Status: He is alert.           Assessment & Plan:  His leg edema has become an issue again, so we will get him back on Lasix 20  daily on an as needed basis. We also discussed elevating the legs and using compression stockings. We will get labs today to check BMET, BNP, etc. He also has some stasis dermatitis, and we will treat this with Triamcinolone cream BID.  Alysia Penna, MD

## 2022-01-29 LAB — BASIC METABOLIC PANEL
BUN: 18 mg/dL (ref 6–23)
CO2: 25 mEq/L (ref 19–32)
Calcium: 8.9 mg/dL (ref 8.4–10.5)
Chloride: 102 mEq/L (ref 96–112)
Creatinine, Ser: 1.78 mg/dL — ABNORMAL HIGH (ref 0.40–1.50)
GFR: 33 mL/min — ABNORMAL LOW (ref >60.00)
Glucose, Bld: 174 mg/dL — ABNORMAL HIGH (ref 70–99)
Potassium: 4.5 mEq/L (ref 3.5–5.1)
Sodium: 135 mEq/L (ref 135–145)

## 2022-01-29 LAB — CBC WITH DIFFERENTIAL/PLATELET
Basophils Absolute: 0 10*3/uL (ref 0.0–0.1)
Basophils Relative: 0.8 % (ref 0.0–3.0)
Eosinophils Absolute: 0.1 10*3/uL (ref 0.0–0.7)
Eosinophils Relative: 0.9 % (ref 0.0–5.0)
HCT: 42.9 % (ref 39.0–52.0)
Hemoglobin: 14.6 g/dL (ref 13.0–17.0)
Lymphocytes Relative: 33.9 % (ref 12.0–46.0)
Lymphs Abs: 2 10*3/uL (ref 0.7–4.0)
MCHC: 34.1 g/dL (ref 30.0–36.0)
MCV: 94.4 fl (ref 78.0–100.0)
Monocytes Absolute: 0.7 10*3/uL (ref 0.1–1.0)
Monocytes Relative: 11.4 % (ref 3.0–12.0)
Neutro Abs: 3.1 10*3/uL (ref 1.4–7.7)
Neutrophils Relative %: 53 % (ref 43.0–77.0)
Platelets: 164 10*3/uL (ref 150.0–400.0)
RBC: 4.54 Mil/uL (ref 4.22–5.81)
RDW: 13.9 % (ref 11.5–15.5)
WBC: 5.8 10*3/uL (ref 4.0–10.5)

## 2022-01-29 LAB — BRAIN NATRIURETIC PEPTIDE: Pro B Natriuretic peptide (BNP): 80 pg/mL (ref 0.0–100.0)

## 2022-01-29 LAB — TSH: TSH: 4.41 u[IU]/mL (ref 0.35–5.50)

## 2022-02-04 ENCOUNTER — Ambulatory Visit (INDEPENDENT_AMBULATORY_CARE_PROVIDER_SITE_OTHER): Payer: PPO

## 2022-02-04 DIAGNOSIS — Z7901 Long term (current) use of anticoagulants: Secondary | ICD-10-CM | POA: Diagnosis not present

## 2022-02-04 LAB — POCT INR: INR: 2.7 (ref 2.0–3.0)

## 2022-02-04 NOTE — Progress Notes (Signed)
Continue 1 tablet daily except take 1 1/2 tablets on Mondays and Thursday. Re-check in 6 weeks.

## 2022-02-12 ENCOUNTER — Other Ambulatory Visit: Payer: Self-pay | Admitting: Family Medicine

## 2022-02-13 DIAGNOSIS — H43813 Vitreous degeneration, bilateral: Secondary | ICD-10-CM | POA: Diagnosis not present

## 2022-02-13 DIAGNOSIS — H34833 Tributary (branch) retinal vein occlusion, bilateral, with macular edema: Secondary | ICD-10-CM | POA: Diagnosis not present

## 2022-02-13 DIAGNOSIS — H3582 Retinal ischemia: Secondary | ICD-10-CM | POA: Diagnosis not present

## 2022-02-13 DIAGNOSIS — H34233 Retinal artery branch occlusion, bilateral: Secondary | ICD-10-CM | POA: Diagnosis not present

## 2022-02-20 ENCOUNTER — Other Ambulatory Visit: Payer: Self-pay | Admitting: Family Medicine

## 2022-03-18 ENCOUNTER — Ambulatory Visit (INDEPENDENT_AMBULATORY_CARE_PROVIDER_SITE_OTHER): Payer: PPO

## 2022-03-18 DIAGNOSIS — Z7901 Long term (current) use of anticoagulants: Secondary | ICD-10-CM

## 2022-03-18 LAB — POCT INR: INR: 2.6 (ref 2.0–3.0)

## 2022-03-18 NOTE — Progress Notes (Signed)
Continue 1 tablet daily except take 1 1/2 tablets on Mondays and Thursday. Re-check in 6 weeks.

## 2022-03-18 NOTE — Patient Instructions (Addendum)
Pre visit review using our clinic review tool, if applicable. No additional management support is needed unless otherwise documented below in the visit note.  Continue 1 tablet daily except take 1 1/2 tablets on Mondays and Thursday. Re-check in 6 weeks.

## 2022-03-20 ENCOUNTER — Other Ambulatory Visit: Payer: Self-pay | Admitting: Family Medicine

## 2022-03-20 DIAGNOSIS — Z7901 Long term (current) use of anticoagulants: Secondary | ICD-10-CM

## 2022-03-20 NOTE — Telephone Encounter (Signed)
Pt compliant with warfarin management and PCP apts. Sent in refill of warfarin.  

## 2022-03-25 ENCOUNTER — Telehealth: Payer: Self-pay

## 2022-03-25 NOTE — Telephone Encounter (Signed)
Pt's wife, Raul Del, called to report she picked up warfarin for pt and it appeared to be a different shape and a slight change in shade.  [name] read letters and numbers on tablet, AN, 763, 2 1/2. This does correlate with a 2.5 mg warfarin tablet. [name] reported the tablet was oval and a lighter green color. [name] reported pt no longer uses 5 mg tablets. Removed from med list. Advised it is best to check INR 1-2 weeks after starting warfarin from a new manufacturer. RS apt for 2 weeks. Pt will start new warfarin at the end of the week. Advised if any signs or symptoms of bleeding or blood clots to contact the clinic or call 911. [name] verbalized understanding.

## 2022-04-08 ENCOUNTER — Ambulatory Visit (INDEPENDENT_AMBULATORY_CARE_PROVIDER_SITE_OTHER): Payer: PPO

## 2022-04-08 DIAGNOSIS — Z7901 Long term (current) use of anticoagulants: Secondary | ICD-10-CM

## 2022-04-08 LAB — POCT INR: INR: 2.4 (ref 2.0–3.0)

## 2022-04-08 NOTE — Patient Instructions (Addendum)
Pre visit review using our clinic review tool, if applicable. No additional management support is needed unless otherwise documented below in the visit note.  Increase dose today to take 2 tablets and then continue 1 tablet daily except take 1 1/2 tablets on Mondays and Thursday. Re-check in 3 weeks.

## 2022-04-08 NOTE — Progress Notes (Signed)
Increase dose today to take 2 tablets and then continue 1 tablet daily except take 1 1/2 tablets on Mondays and Thursday. Re-check in 3 weeks.

## 2022-04-18 ENCOUNTER — Other Ambulatory Visit: Payer: Self-pay | Admitting: Family Medicine

## 2022-04-19 NOTE — Telephone Encounter (Signed)
Last OV-01/25/22 with Dr. Elease Hashimoto Last refill- 02/13/2021  No future OV scheduled.

## 2022-04-24 DIAGNOSIS — H34833 Tributary (branch) retinal vein occlusion, bilateral, with macular edema: Secondary | ICD-10-CM | POA: Diagnosis not present

## 2022-04-24 DIAGNOSIS — H3582 Retinal ischemia: Secondary | ICD-10-CM | POA: Diagnosis not present

## 2022-04-24 DIAGNOSIS — H34233 Retinal artery branch occlusion, bilateral: Secondary | ICD-10-CM | POA: Diagnosis not present

## 2022-04-24 DIAGNOSIS — H43813 Vitreous degeneration, bilateral: Secondary | ICD-10-CM | POA: Diagnosis not present

## 2022-04-29 ENCOUNTER — Ambulatory Visit: Payer: PPO

## 2022-05-01 ENCOUNTER — Ambulatory Visit (INDEPENDENT_AMBULATORY_CARE_PROVIDER_SITE_OTHER): Payer: PPO

## 2022-05-01 DIAGNOSIS — Z7901 Long term (current) use of anticoagulants: Secondary | ICD-10-CM

## 2022-05-01 LAB — POCT INR: INR: 3.3 — AB (ref 2.0–3.0)

## 2022-05-01 NOTE — Progress Notes (Signed)
Take half of a 2.5 mg tablet today then continue 1 tablet daily except take 1 1/2 tablets on Mondays and Thursday. Re-check in 3 weeks.

## 2022-05-01 NOTE — Patient Instructions (Signed)
Take half of a 2.5 mg tablet today then continue 1 tablet daily except take 1 1/2 tablets on Mondays and Thursday. Re-check in 3 weeks.

## 2022-05-19 ENCOUNTER — Other Ambulatory Visit: Payer: Self-pay | Admitting: Family Medicine

## 2022-05-22 ENCOUNTER — Ambulatory Visit (INDEPENDENT_AMBULATORY_CARE_PROVIDER_SITE_OTHER): Payer: PPO

## 2022-05-22 DIAGNOSIS — Z7901 Long term (current) use of anticoagulants: Secondary | ICD-10-CM | POA: Diagnosis not present

## 2022-05-22 LAB — POCT INR: INR: 2.8 (ref 2.0–3.0)

## 2022-05-22 NOTE — Progress Notes (Signed)
Continue to take 1 tablet daily except take 1 1/2 tablets on Mondays and Thursdays. Re-check in 8 weeks per pt request.

## 2022-05-22 NOTE — Patient Instructions (Signed)
Continue to take 1 tablet daily except take 1 1/2 tablets on Mondays and Thursdays. Re-check in 8 weeks.

## 2022-05-28 ENCOUNTER — Ambulatory Visit (INDEPENDENT_AMBULATORY_CARE_PROVIDER_SITE_OTHER): Payer: PPO

## 2022-05-28 DIAGNOSIS — Z23 Encounter for immunization: Secondary | ICD-10-CM

## 2022-07-10 ENCOUNTER — Ambulatory Visit: Payer: PPO

## 2022-07-15 ENCOUNTER — Ambulatory Visit (INDEPENDENT_AMBULATORY_CARE_PROVIDER_SITE_OTHER): Payer: PPO

## 2022-07-15 ENCOUNTER — Ambulatory Visit: Payer: PPO

## 2022-07-15 DIAGNOSIS — Z7901 Long term (current) use of anticoagulants: Secondary | ICD-10-CM

## 2022-07-15 LAB — POCT INR: INR: 2 (ref 2.0–3.0)

## 2022-07-15 NOTE — Patient Instructions (Addendum)
Pre visit review using our clinic review tool, if applicable. No additional management support is needed unless otherwise documented below in the visit note.  Increase dose today to take 2 tablets and increase dose tomorrow to take 1 1/2 tablets and then continue to take 1 tablet daily except take 1 1/2 tablets on Mondays and Thursdays. Re-check in 2 weeks per pt request.

## 2022-07-15 NOTE — Progress Notes (Unsigned)
Increase dose today to take 2 tablets and increase dose tomorrow to take 1 1/2 tablets and then continue to take 1 tablet daily except take 1 1/2 tablets on Mondays and Thursdays. Re-check in 2 weeks per pt request.

## 2022-07-17 DIAGNOSIS — H34833 Tributary (branch) retinal vein occlusion, bilateral, with macular edema: Secondary | ICD-10-CM | POA: Diagnosis not present

## 2022-07-17 DIAGNOSIS — H34233 Retinal artery branch occlusion, bilateral: Secondary | ICD-10-CM | POA: Diagnosis not present

## 2022-07-17 DIAGNOSIS — H43813 Vitreous degeneration, bilateral: Secondary | ICD-10-CM | POA: Diagnosis not present

## 2022-07-17 NOTE — Addendum Note (Signed)
Addended by: Randall An A on: 07/17/2022 08:11 AM   Modules accepted: Level of Service

## 2022-07-29 ENCOUNTER — Ambulatory Visit (INDEPENDENT_AMBULATORY_CARE_PROVIDER_SITE_OTHER): Payer: PPO

## 2022-07-29 DIAGNOSIS — Z7901 Long term (current) use of anticoagulants: Secondary | ICD-10-CM | POA: Diagnosis not present

## 2022-07-29 LAB — POCT INR: INR: 2 (ref 2.0–3.0)

## 2022-07-29 NOTE — Progress Notes (Cosign Needed Addendum)
Pt reports he has been eating a lot of pecans but these do not usually reduce the effects of warfarin. Pt denies any other changes.  Increase dose today to take 2 tablets and increase dose tomorrow to take 1 1/2 tablets and then change weekly dose to take 1 1/2 tablet daily except take 1 tablets on Tuesdays, Thursdays and Saturdays . Re-check in 3 weeks per pt request.

## 2022-07-29 NOTE — Patient Instructions (Addendum)
Pre visit review using our clinic review tool, if applicable. No additional management support is needed unless otherwise documented below in the visit note.  Increase dose today to take 2 tablets and increase dose tomorrow to take 1 1/2 tablets and then change weekly dose to take 1 1/2 tablet daily except take 1 tablets on Tuesdays, Thursdays and Saturdays . Re-check in 3 weeks

## 2022-08-17 ENCOUNTER — Other Ambulatory Visit: Payer: Self-pay | Admitting: Family Medicine

## 2022-08-19 ENCOUNTER — Ambulatory Visit (INDEPENDENT_AMBULATORY_CARE_PROVIDER_SITE_OTHER): Payer: PPO

## 2022-08-19 DIAGNOSIS — Z7901 Long term (current) use of anticoagulants: Secondary | ICD-10-CM

## 2022-08-19 LAB — POCT INR: INR: 3.1 — AB (ref 2.0–3.0)

## 2022-08-19 NOTE — Progress Notes (Signed)
Decrease dose today to take 1/2 tablet today and then continue 1 1/2 tablet daily except take 1 tablets on Tuesdays, Thursdays and Saturdays . Re-check in 4 weeks.

## 2022-08-19 NOTE — Patient Instructions (Addendum)
Pre visit review using our clinic review tool, if applicable. No additional management support is needed unless otherwise documented below in the visit note.  Decrease dose today to take 1/2 tablet today and then continue 1 1/2 tablet daily except take 1 tablets on Tuesdays, Thursdays and Saturdays . Re-check in 4 weeks.

## 2022-09-14 ENCOUNTER — Other Ambulatory Visit: Payer: Self-pay | Admitting: Family Medicine

## 2022-09-14 DIAGNOSIS — Z7901 Long term (current) use of anticoagulants: Secondary | ICD-10-CM

## 2022-09-16 ENCOUNTER — Ambulatory Visit (INDEPENDENT_AMBULATORY_CARE_PROVIDER_SITE_OTHER): Payer: PPO

## 2022-09-16 DIAGNOSIS — Z7901 Long term (current) use of anticoagulants: Secondary | ICD-10-CM | POA: Diagnosis not present

## 2022-09-16 LAB — POCT INR: INR: 3.7 — AB (ref 2.0–3.0)

## 2022-09-16 NOTE — Patient Instructions (Addendum)
Pre visit review using our clinic review tool, if applicable. No additional management support is needed unless otherwise documented below in the visit note.  Hold dose today and the change weekly dose to take 1 tablet daily except take 1 1/2 tablets on Sundays, Wednesdays and Fridays. Re-check in 3 weeks.

## 2022-09-16 NOTE — Progress Notes (Addendum)
Pt's wife reports intermittent LLE. Pt is not consistent with diuretic. Advised importance to follow medication regime. Hold dose today and the change weekly dose to take 1 tablet daily except take 1 1/2 tablets on Sundays, Wednesdays and Fridays. Re-check in 3 weeks.

## 2022-09-17 NOTE — Telephone Encounter (Signed)
Pt is compliant with warfarin management and PCP apts. ?Sent in refill.  ?

## 2022-09-18 DIAGNOSIS — H34833 Tributary (branch) retinal vein occlusion, bilateral, with macular edema: Secondary | ICD-10-CM | POA: Diagnosis not present

## 2022-09-18 DIAGNOSIS — H43813 Vitreous degeneration, bilateral: Secondary | ICD-10-CM | POA: Diagnosis not present

## 2022-09-18 DIAGNOSIS — H34233 Retinal artery branch occlusion, bilateral: Secondary | ICD-10-CM | POA: Diagnosis not present

## 2022-10-07 ENCOUNTER — Ambulatory Visit (INDEPENDENT_AMBULATORY_CARE_PROVIDER_SITE_OTHER): Payer: PPO

## 2022-10-07 DIAGNOSIS — Z7901 Long term (current) use of anticoagulants: Secondary | ICD-10-CM | POA: Diagnosis not present

## 2022-10-07 LAB — POCT INR: INR: 3.2 — AB (ref 2.0–3.0)

## 2022-10-07 NOTE — Progress Notes (Signed)
Reduce dose to take 1/2 tablet and the change weekly dose to take 1 tablet daily except take 1 1/2 tablets on Sundays and Thursdays. Recheck in 3 weeks.

## 2022-10-07 NOTE — Patient Instructions (Addendum)
Pre visit review using our clinic review tool, if applicable. No additional management support is needed unless otherwise documented below in the visit note.  Reduce dose to take 1/2 tablet and the change weekly dose to take 1 tablet daily except take 1 1/2 tablets on Sundays and Thursdays. Recheck in 3 weeks.

## 2022-10-22 ENCOUNTER — Telehealth: Payer: Self-pay | Admitting: Family Medicine

## 2022-10-22 NOTE — Telephone Encounter (Signed)
Last OV 01/28/2022

## 2022-10-22 NOTE — Telephone Encounter (Signed)
The RX is ready  

## 2022-10-22 NOTE — Telephone Encounter (Signed)
Spouse called to request a prescription for a motorized scooter for Patient.   Spouse or one of Pt's children will come into the office to pick up a hard copy of the Rx.  Please advise.

## 2022-10-24 ENCOUNTER — Other Ambulatory Visit: Payer: Self-pay | Admitting: Family Medicine

## 2022-10-24 NOTE — Telephone Encounter (Signed)
Attempted to call pt spouse regarding prescription for Motorized scooter but voicemail has not been set up, no option to leave a message, will keep trying

## 2022-10-25 ENCOUNTER — Other Ambulatory Visit: Payer: Self-pay | Admitting: Family Medicine

## 2022-10-25 NOTE — Telephone Encounter (Signed)
Spoke with pt spouse, advised to pick up the Rx for pt Scooter at the front office filing cabinet, pt wife states will pick up on Monday.

## 2022-10-28 ENCOUNTER — Ambulatory Visit (INDEPENDENT_AMBULATORY_CARE_PROVIDER_SITE_OTHER): Payer: PPO

## 2022-10-28 DIAGNOSIS — Z7901 Long term (current) use of anticoagulants: Secondary | ICD-10-CM | POA: Diagnosis not present

## 2022-10-28 LAB — POCT INR: INR: 2.7 (ref 2.0–3.0)

## 2022-10-28 NOTE — Progress Notes (Signed)
Continue 1 tablet daily except take 1 1/2 tablets on Sundays and Thursdays. Recheck in 6 weeks.

## 2022-10-28 NOTE — Patient Instructions (Addendum)
Pre visit review using our clinic review tool, if applicable. No additional management support is needed unless otherwise documented below in the visit note.  Continue 1 tablet daily except take 1 1/2 tablets on Sundays and Thursdays. Recheck in 6 weeks.

## 2022-11-15 ENCOUNTER — Other Ambulatory Visit: Payer: Self-pay | Admitting: Family Medicine

## 2022-11-15 ENCOUNTER — Other Ambulatory Visit: Payer: Self-pay

## 2022-11-15 DIAGNOSIS — I10 Essential (primary) hypertension: Secondary | ICD-10-CM

## 2022-11-15 MED ORDER — METOPROLOL SUCCINATE ER 25 MG PO TB24
25.0000 mg | ORAL_TABLET | Freq: Every day | ORAL | 0 refills | Status: DC
Start: 2022-11-15 — End: 2023-02-11

## 2022-11-15 NOTE — Telephone Encounter (Signed)
Last Lipid labs-09/29/20** Last OV-01/28/22  No future OV scheduled.

## 2022-11-20 DIAGNOSIS — H34833 Tributary (branch) retinal vein occlusion, bilateral, with macular edema: Secondary | ICD-10-CM | POA: Diagnosis not present

## 2022-11-20 DIAGNOSIS — H43813 Vitreous degeneration, bilateral: Secondary | ICD-10-CM | POA: Diagnosis not present

## 2022-11-20 DIAGNOSIS — H34233 Retinal artery branch occlusion, bilateral: Secondary | ICD-10-CM | POA: Diagnosis not present

## 2022-11-25 ENCOUNTER — Telehealth: Payer: Self-pay | Admitting: Family Medicine

## 2022-11-25 NOTE — Telephone Encounter (Signed)
Called patient to schedule Medicare Annual Wellness Visit (AWV). Left message for patient to call back and schedule Medicare Annual Wellness Visit (AWV).  Last date of AWV: 11/26/21  Please schedule an appointment at any time with [facility] or hannah kim.  If any questions, please contact me at [phone].  Thank you ,  Rudell Cobb AWV direct phone # [phone]

## 2022-12-09 ENCOUNTER — Ambulatory Visit (INDEPENDENT_AMBULATORY_CARE_PROVIDER_SITE_OTHER): Payer: PPO

## 2022-12-09 DIAGNOSIS — Z7901 Long term (current) use of anticoagulants: Secondary | ICD-10-CM

## 2022-12-09 LAB — POCT INR
INR: 2.5 (ref 2.0–3.0)
INR: 3 (ref 2.0–3.0)

## 2022-12-09 NOTE — Patient Instructions (Addendum)
Pre visit review using our clinic review tool, if applicable. No additional management support is needed unless otherwise documented below in the visit note.  Continue 1 tablet daily except take 1 1/2 tablets on Sundays and Thursdays. Recheck in 6 weeks.  

## 2022-12-09 NOTE — Progress Notes (Signed)
Continue 1 tablet daily except take 1 1/2 tablets on Sundays and Thursdays. Recheck in 6 weeks.  

## 2022-12-11 ENCOUNTER — Other Ambulatory Visit: Payer: Self-pay | Admitting: Family Medicine

## 2022-12-11 DIAGNOSIS — E782 Mixed hyperlipidemia: Secondary | ICD-10-CM

## 2022-12-25 ENCOUNTER — Telehealth: Payer: Self-pay | Admitting: Family Medicine

## 2022-12-25 NOTE — Telephone Encounter (Signed)
Contacted Stacy Gardner to schedule their annual wellness visit. Appointment made for 12/31/22.  Zella Ball [facility] AWV direct phone # [phone]

## 2022-12-31 ENCOUNTER — Encounter: Payer: Self-pay | Admitting: Family Medicine

## 2022-12-31 ENCOUNTER — Ambulatory Visit (INDEPENDENT_AMBULATORY_CARE_PROVIDER_SITE_OTHER): Payer: PPO | Admitting: Family Medicine

## 2022-12-31 VITALS — Ht 71.0 in | Wt 202.0 lb

## 2022-12-31 DIAGNOSIS — Z0000 Encounter for general adult medical examination without abnormal findings: Secondary | ICD-10-CM

## 2022-12-31 NOTE — Progress Notes (Signed)
PATIENT CHECK-IN and HEALTH RISK ASSESSMENT QUESTIONNAIRE:  -completed by phone/video for upcoming Medicare Preventive Visit  Pre-Visit Check-in: 1)Vitals (height, wt, BP, etc) - record in vitals section for visit on day of visit 2)Review and Update Medications, Allergies PMH, Surgeries, Social history in Epic 3)Hospitalizations in the last year with date/reason? No  4)Review and Update Care Team (patient's specialists) in Epic 5) Complete PHQ9 in Epic  6) Complete Fall Screening in Epic 7)Review all Health Maintenance Due and order under PCP if not done.  Medicare Wellness Patient Questionnaire:  Answer theses question about your habits: Do you drink alcohol? No If yes, how many drinks do you have a day?N/A Have you ever smoked?No Quit date if applicable? N/A  How many packs a day do/did you smoke? N/A Do you use smokeless tobacco?No Do you use an illicit drugs?No Do you exercises? No IF so, what type and how many days/minutes per week?N/A Are you sexually active? N/A Number of partners?N/A Usually eats his meals at home - reports someone else does the cooking, eats look  Typical breakfast: Eggs, Toast Typical lunch Varies Typical dinner Varies  Typical snacks: Peanuts  Beverages: Cheerwine  Answer theses question about you: Can you perform most household chores?Yes Do you find it hard to follow a conversation in a noisy room?No Do you often ask people to speak up or repeat themselves?Yes Do you feel that you have a problem with memory? Do you balance your checkbook and or bank acounts?No Do you feel safe at home?Yes Last dentist visit?Unknown Do you need assistance with any of the following: Please note if so   Driving? Yes does not drive  Feeding yourself? No  Getting from bed to chair? No  Getting to the toilet? No  Bathing or showering? No  Dressing yourself? No  Managing money?No  Climbing a flight of stairs No  Preparing meals? No    Do you have Advanced  Directives in place (Living Will, Healthcare Power or Attorney)? No    Last eye Exam and location? Less than 1 year ago   Do you currently use prescribed or non-prescribed narcotic or opioid pain medications?No  Do you have a history or close family history of breast, ovarian, tubal or peritoneal cancer or a family member with BRCA (breast cancer susceptibility 1 and 2) gene mutations? No  Nurse/Assistant Credentials/time stamp: MG 4:07 Pm   ----------------------------------------------------------------------------------------------------------------------------------------------------------------------------------------------------------------------    MEDICARE ANNUAL PREVENTIVE CARE VISIT WITH PROVIDER (Welcome to Medicare, initial annual wellness or annual wellness exam)  Virtual Visit via Phone Note  I connected with Justin Gallegos on 12/31/22  by phone and verified that I am speaking with the correct person using two identifiers.  Location patient: home Location provider:work or home office Persons participating in the virtual visit: patient, provider  Concerns and/or follow up today: no concerns   See HM section in Epic for other details of completed HM.    ROS: negative for report of fevers, unintentional weight loss, vision changes, vision loss, hearing loss or change, chest pain, sob, hemoptysis, melena, hematochezia, hematuria, falls, bleeding or bruising, thoughts of suicide or self harm, memory loss  Patient-completed extensive health risk assessment - reviewed and discussed with the patient: See Health Risk Assessment completed with patient prior to the visit either above or in recent phone note. This was reviewed in detailed with the patient today and appropriate recommendations, orders and referrals were placed as needed per Summary below and patient instructions.   Review of Medical  History: -PMH, PSH, Family History and current specialty and care providers  reviewed and updated and listed below   Patient Care Team: Nelwyn Salisbury, MD as PCP - General Pricilla Riffle, MD as PCP - Cardiology (Cardiology) Stephannie Li, MD as Consulting Physician (Ophthalmology) Verner Chol, [facility] (Inactive) as Pharmacist (Pharmacist)   Past Medical History:  Diagnosis Date   Arthritis    Atrial fibrillation [facility])    sees Dr. Dietrich Pates    Cancer [facility])    ureteral, per Dr. Laverle Patter    Eye disease    sees Dr. Stephannie Li    Hemorrhoids    Hyperlipidemia    Hypertension    Low back pain    TIA (transient ischemic attack)     Past Surgical History:  Procedure Laterality Date   APPENDECTOMY     CATARACT EXTRACTION, BILATERAL  2013   per Dr. Dione Booze    COLONOSCOPY  06-28-10   per Dr. Leone Payor, benign polyps and severe diverticulosis, no repeats needed    HERNIA REPAIR     NEPHRECTOMY     TONSILLECTOMY     URETERECTOMY      Social History   Socioeconomic History   Marital status: Married    Spouse name: Not on file   Number of children: 9   Years of education: Not on file   Highest education level: Not on file  Occupational History   Occupation: Academic librarian for [employer]    Comment: retired   Tobacco Use   Smoking status: Never   Smokeless tobacco: Never  Vaping Use   Vaping Use: Never used  Substance and Sexual Activity   Alcohol use: No    Alcohol/week: 0.0 standard drinks of alcohol   Drug use: No   Sexual activity: Not on file  Other Topics Concern   Not on file  Social History Narrative   Lives with wife on one level house   Has nine children, (two local), with 24 grandchildren and 5 great grandchildren   Works with Systems analyst 2 days/week at [employer]   Attends church with wife, who currently drives him everywhere d/t poor vision   Social Determinants of Health   Financial Resource Strain: Low Risk  (12/31/2022)   Overall Financial Resource Strain (CARDIA)    Difficulty of Paying Living  Expenses: Not hard at all  Food Insecurity: No Food Insecurity (12/31/2022)   Hunger Vital Sign    Worried About Running Out of Food in the Last Year: Never true    Ran Out of Food in the Last Year: Never true  Transportation Needs: No Transportation Needs (12/31/2022)   PRAPARE - Administrator, Civil Service (Medical): No    Lack of Transportation (Non-Medical): No  Physical Activity: Inactive (12/31/2022)   Exercise Vital Sign    Days of Exercise per Week: 0 days    Minutes of Exercise per Session: 0 min  Stress: No Stress Concern Present (12/31/2022)   [employer] of Occupational Health - Occupational Stress Questionnaire    Feeling of Stress : Not at all  Social Connections: Moderately Integrated (12/31/2022)   Social Connection and Isolation Panel [NHANES]    Frequency of Communication with Friends and Family: Three times a week    Frequency of Social Gatherings with Friends and Family: Once a week    Attends Religious Services: 1 to 4 times per year    Active Member of Clubs or Organizations: No  Attends Banker Meetings: Never    Marital Status: Married  Catering manager Violence: Not At Risk (12/31/2022)   Humiliation, Afraid, Rape, and Kick questionnaire    Fear of Current or Ex-Partner: No    Emotionally Abused: No    Physically Abused: No    Sexually Abused: No    Family History  Problem Relation Age of Onset   Stroke Father    Coronary artery disease Other        fhx   Stroke Other        fhx    Current Outpatient Medications on File Prior to Visit  Medication Sig Dispense Refill   Aflibercept (EYLEA IO) Inject into the eye.     atorvastatin (LIPITOR) 40 MG tablet Take 1 tablet by mouth once daily 90 tablet 3   Cholecalciferol (VITAMIN D) 125 MCG (5000 UT) CAPS Take 5,000 Units by mouth daily.      ezetimibe (ZETIA) 10 MG tablet TAKE 1 TABLET BY MOUTH ONCE DAILY .PHYSICAL  WITH  LABS  REQUIRED  FOR  GREATER  QUANTITY  OF  REFILLS.  30 tablet 0   fenofibrate (TRICOR) 145 MG tablet Take 1 tablet (145 mg total) by mouth daily. *APPOINTMENT REQUIRED FOR FUTURE REFILLS* 30 tablet 1   furosemide (LASIX) 20 MG tablet Take one tablet every day as needed for fluid 60 tablet 5   ketoconazole (NIZORAL) 2 % cream APPLY TOPICALLY DAILY 30 g 5   metoprolol succinate (TOPROL-XL) 25 MG 24 hr tablet Take 1 tablet (25 mg total) by mouth daily. 90 tablet 0   tobramycin (TOBREX) 0.3 % ophthalmic solution 1 drop.     triamcinolone cream (KENALOG) 0.1 % Apply 1 Application topically 2 (two) times daily. 45 g 5   warfarin (COUMADIN) 1 MG tablet TAKE ONE TABLET BY MOUTH DAILY OR AS DIRECTED BY ANTICOAGULATION CLINIC 30 tablet 1   warfarin (COUMADIN) 2.5 MG tablet TAKE 1 TABLET BY MOUTH ONCE DAILY EXCEPT TAKE 1 AND 1/2 ON SUNDAYS, WEDNESDAYS AND FRIDAYS OR AS DIRECTED BY ANTICOAGULATION CLINIC 120 tablet 1   No current facility-administered medications on file prior to visit.    Allergies  Allergen Reactions   Penicillins     Unable to breathe    Amoxicillin-Pot Clavulanate     REACTION: throat swelling       Physical Exam There were no vitals filed for this visit. Estimated body mass index is 28.17 kg/m as calculated from the following:   Height as of this encounter: 5\' 11"  (1.803 m).   Weight as of this encounter: 202 lb (91.6 kg).  EKG (optional): deferred due to virtual visit  GENERAL: alert, oriented, no acute distress detected; full vision exam deferred due to pandemic and/or virtual encounter PSYCH/NEURO: pleasant and cooperative, no obvious depression or anxiety, speech and thought processing grossly intact, Cognitive function grossly intact  Flowsheet Row Office Visit [facility]/31/2022 [facility]  PHQ-9 Total Score 2           12/31/2022    3:49 PM 01/28/2022    3:16 PM 12/25/2021   10:55 AM 11/26/2021   11:08 AM 09/25/2021    3:34 PM  Depression screen PHQ 2/9  Decreased Interest 0  0 0 0 0  Down, Depressed, Hopeless 0 0 0 0 0  PHQ - 2 Score 0 0 0 0 0  Altered sleeping 0 0 0  0  Tired, decreased energy 1 3 0  0  Change in appetite 0 1 0  0  Feeling bad or failure about yourself  0 0 0  0  Trouble concentrating 1 0 0  0  Moving slowly or fidgety/restless 0 0 0  0  Suicidal thoughts 0 0 0  0  PHQ-9 Score 2 4 0  0  Difficult doing work/chores Not difficult at all  Not difficult at all    Denies depression.      09/25/2021    3:34 PM 11/26/2021   11:07 AM 12/25/2021   10:54 AM 01/28/2022    3:15 PM 12/31/2022    3:50 PM  Fall Risk  Falls in the past year? 1 0 0 1 0  Was there an injury with Fall? 0 0 0 0 0  Fall Risk Category Calculator 1 0 0 2 0  Fall Risk Category (Retired) Low Low Low Moderate   (RETIRED) Patient Fall Risk Level Moderate fall risk Moderate fall risk Moderate fall risk Moderate fall risk   Patient at Risk for Falls Due to  Impaired balance/gait;Impaired mobility;Impaired vision;Medication side effect No Fall Risks History of fall(s) No Fall Risks  Fall risk Follow up  Falls evaluation completed;Education provided;Falls prevention discussed Falls evaluation completed Falls evaluation completed Falls evaluation completed     SUMMARY AND PLAN:  Encounter for Medicare annual wellness exam   Discussed applicable health maintenance/preventive health measures and advised and referred or ordered per patient preferences: -he reports he has had his shingles vaccines and his covid boosters.   Health Maintenance  Topic Date Due   COVID-19 Vaccine (4 - 2023-24 season) 04/12/2022   Zoster Vaccines- Shingrix (1 of 2) 04/02/2023 (Originally 10/25/1947)   INFLUENZA VACCINE  03/13/2023   Medicare Annual Wellness (AWV)  12/31/2023   DTaP/Tdap/Td (3 - Td or Tdap) 11/14/2030   Pneumonia Vaccine 54+ Years old  Completed   HPV VACCINES  Aged Out   Education and counseling on the following was provided based on the above review of health and a plan/checklist  for the patient, along with additional information discussed, was provided for the patient in the patient instructions :  -Advised on importance of completing advanced directives, discussed options for completing and provided information in patient instructions as well  -Provided counseling and plan for difficulty hearing - he feels hearing is ok and does not feel needs evaluation -he also did not feel needed further eval for memory -Provided counseling and plan for increased risk of falling if applicable per above screening. Using cane or walker.  Reviewed safe balance exercises that can be done at home to improve balance and discussed exercise guidelines for adults with include balance exercises at least 3 days per week.  -Advised and counseled on a healthy lifestyle - including the importance of a healthy diet, regular physical activity, social connections and stress management. -Reviewed patient's current diet. Advised and counseled on a whole foods based healthy diet. A summary of a healthy diet was provided in the Patient Instructions.  -reviewed patient's current physical activity level and discussed exercise guidelines for adults. Discussed safe exercise at home to assist in meeting exercise guideline recommendations in a safe and healthy way.     Follow up: see patient instructions   Patient Instructions  I really enjoyed getting to talk with you today! I am available on Tuesdays and Thursdays for virtual visits if you have any questions or concerns, or if I can be of any further assistance.   CHECKLIST FROM ANNUAL WELLNESS VISIT:  -  Follow up (please call to schedule if not scheduled after visit):   -yearly for annual wellness visit with primary care office  Here is a list of your preventive care/health maintenance measures and the plan for each if any are due:  PLAN For any measures below that may be due:  -can get covid boosters at the pharmacy  Health Maintenance  Topic Date  Due   COVID-19 Vaccine (4 - 2023-24 season) 04/12/2022   Zoster Vaccines- Shingrix (1 of 2) 04/02/2023 (Originally 10/25/1947)   INFLUENZA VACCINE  03/13/2023   Medicare Annual Wellness (AWV)  12/31/2023   DTaP/Tdap/Td (3 - Td or Tdap) 11/14/2030   Pneumonia Vaccine 71+ Years old  Completed   HPV VACCINES  Aged Out    -See a dentist at least yearly  -Get your eyes checked and then per your eye specialist's recommendations  -Other issues addressed today:   -I have included below further information regarding a healthy whole foods based diet, physical activity guidelines for adults, stress management and opportunities for social connections. I hope you find this information useful.   -----------------------------------------------------------------------------------------------------------------------------------------------------------------------------------------------------------------------------------------------------------  NUTRITION: -eat real food: lots of colorful vegetables (half the plate) and fruits -5-7 servings of vegetables and fruits per day (fresh or steamed is best), exp. 2 servings of vegetables with lunch and dinner and 2 servings of fruit per day. Berries and greens such as kale and collards are great choices.  -consume on a regular basis: whole grains (make sure first ingredient on label contains the word "whole"), fresh fruits, fish, nuts, seeds, healthy oils (such as olive oil, avocado oil, grape seed oil) -may eat small amounts of dairy and lean meat on occasion, but avoid processed meats such as ham, bacon, lunch meat, etc. -drink water -try to avoid fast food and pre-packaged foods, processed meat -most experts advise limiting sodium to < 2300mg  per day, should limit further is any chronic conditions such as high blood pressure, heart disease, diabetes, etc. The American Heart Association advised that < 1500mg  is is ideal -try to avoid foods that contain any  ingredients with names you do not recognize  -try to avoid sugar/sweets (except for the natural sugar that occurs in fresh fruit) -try to avoid sweet drinks -try to avoid white rice, white bread, pasta (unless whole grain), white or yellow potatoes  EXERCISE GUIDELINES FOR ADULTS: -if you wish to increase your physical activity, do so gradually and with the approval of your doctor -STOP and seek medical care immediately if you have any chest pain, chest discomfort or trouble breathing when starting or increasing exercise  -move and stretch your body, legs, feet and arms when sitting for long periods -Physical activity guidelines for optimal health in adults: -least 150 minutes per week of aerobic exercise (can talk, but not sing) once approved by your doctor, 20-30 minutes of sustained activity or two 10 minute episodes of sustained activity every day.  -resistance training at least 2 days per week if approved by your doctor -balance exercises 3+ days per week:   Stand somewhere where you have something sturdy to hold onto if you lose balance.    1) lift up on toes, start with 5x per day and work up to 20x   2) stand and lift on leg straight out to the side so that foot is a few inches of the floor, start with 5x each side and work up to 20x each side   3) stand on one foot, start with 5 seconds  each side and work up to 20 seconds on each side  If you need ideas or help with getting more active:  -Silver sneakers https://tools.silversneakers.com  -Walk with a Doc: http://www.duncan-williams.com/  -try to include resistance (weight lifting/strength building) and balance exercises twice per week: or the following link for ideas: http://castillo-powell.com/  BuyDucts.dk  STRESS MANAGEMENT: -can try meditating, or just sitting quietly with deep breathing while intentionally relaxing all parts of your body  for 5 minutes daily -if you need further help with stress, anxiety or depression please follow up with your primary doctor or contact the wonderful folks at [facility]: [phone]  SOCIAL CONNECTIONS: -options in [place] if you wish to engage in more social and exercise related activities:  -Silver sneakers https://tools.silversneakers.com  -Walk with a Doc: http://www.duncan-williams.com/  -Check out the [facility] Active Adults 50+ section on the [place] of [employer] (hiking clubs, book clubs, cards and games, chess, exercise classes, aquatic classes and much more) - see the website for details: https://www.[place].gov/departments/parks-recreation/active-adults50  -YouTube has lots of exercise videos for different ages and abilities as well  -[facility] (a variety of indoor and outdoor inperson activities for adults). [phone]. [address]  -Virtual Online Classes (a variety of topics): see seniorplanet.org or call [phone]  -consider volunteering at a school, hospice center, church, senior center or elsewhere           Terressa Koyanagi, DO

## 2022-12-31 NOTE — Patient Instructions (Signed)
I really enjoyed getting to talk with you today! I am available on Tuesdays and Thursdays for virtual visits if you have any questions or concerns, or if I can be of any further assistance.   CHECKLIST FROM ANNUAL WELLNESS VISIT:  -Follow up (please call to schedule if not scheduled after visit):   -yearly for annual wellness visit with primary care office  Here is a list of your preventive care/health maintenance measures and the plan for each if any are due:  PLAN For any measures below that may be due:  -can get covid boosters at the pharmacy  Health Maintenance  Topic Date Due   COVID-19 Vaccine (4 - 2023-24 season) 04/12/2022   Zoster Vaccines- Shingrix (1 of 2) 04/02/2023 (Originally 10/25/1947)   INFLUENZA VACCINE  03/13/2023   Medicare Annual Wellness (AWV)  12/31/2023   DTaP/Tdap/Td (3 - Td or Tdap) 11/14/2030   Pneumonia Vaccine 40+ Years old  Completed   HPV VACCINES  Aged Out    -See a dentist at least yearly  -Get your eyes checked and then per your eye specialist's recommendations  -Other issues addressed today:   -I have included below further information regarding a healthy whole foods based diet, physical activity guidelines for adults, stress management and opportunities for social connections. I hope you find this information useful.   -----------------------------------------------------------------------------------------------------------------------------------------------------------------------------------------------------------------------------------------------------------  NUTRITION: -eat real food: lots of colorful vegetables (half the plate) and fruits -5-7 servings of vegetables and fruits per day (fresh or steamed is best), exp. 2 servings of vegetables with lunch and dinner and 2 servings of fruit per day. Berries and greens such as kale and collards are great choices.  -consume on a regular basis: whole grains (make sure first ingredient on  label contains the word "whole"), fresh fruits, fish, nuts, seeds, healthy oils (such as olive oil, avocado oil, grape seed oil) -may eat small amounts of dairy and lean meat on occasion, but avoid processed meats such as ham, bacon, lunch meat, etc. -drink water -try to avoid fast food and pre-packaged foods, processed meat -most experts advise limiting sodium to < 2300mg  per day, should limit further is any chronic conditions such as high blood pressure, heart disease, diabetes, etc. The American Heart Association advised that < 1500mg  is is ideal -try to avoid foods that contain any ingredients with names you do not recognize  -try to avoid sugar/sweets (except for the natural sugar that occurs in fresh fruit) -try to avoid sweet drinks -try to avoid white rice, white bread, pasta (unless whole grain), white or yellow potatoes  EXERCISE GUIDELINES FOR ADULTS: -if you wish to increase your physical activity, do so gradually and with the approval of your doctor -STOP and seek medical care immediately if you have any chest pain, chest discomfort or trouble breathing when starting or increasing exercise  -move and stretch your body, legs, feet and arms when sitting for long periods -Physical activity guidelines for optimal health in adults: -least 150 minutes per week of aerobic exercise (can talk, but not sing) once approved by your doctor, 20-30 minutes of sustained activity or two 10 minute episodes of sustained activity every day.  -resistance training at least 2 days per week if approved by your doctor -balance exercises 3+ days per week:   Stand somewhere where you have something sturdy to hold onto if you lose balance.    1) lift up on toes, start with 5x per day and work up to 20x   2) stand  and lift on leg straight out to the side so that foot is a few inches of the floor, start with 5x each side and work up to 20x each side   3) stand on one foot, start with 5 seconds each side and work  up to 20 seconds on each side  If you need ideas or help with getting more active:  -Silver sneakers https://tools.silversneakers.com  -Walk with a Doc: http://www.duncan-williams.com/  -try to include resistance (weight lifting/strength building) and balance exercises twice per week: or the following link for ideas: http://castillo-powell.com/  BuyDucts.dk  STRESS MANAGEMENT: -can try meditating, or just sitting quietly with deep breathing while intentionally relaxing all parts of your body for 5 minutes daily -if you need further help with stress, anxiety or depression please follow up with your primary doctor or contact the wonderful folks at [facility]: [phone]  SOCIAL CONNECTIONS: -options in [place] if you wish to engage in more social and exercise related activities:  -Silver sneakers https://tools.silversneakers.com  -Walk with a Doc: http://www.duncan-williams.com/  -Check out the [facility] Active Adults 50+ section on the [place] of [employer] (hiking clubs, book clubs, cards and games, chess, exercise classes, aquatic classes and much more) - see the website for details: https://www.[place].gov/departments/parks-recreation/active-adults50  -YouTube has lots of exercise videos for different ages and abilities as well  -[facility] (a variety of indoor and outdoor inperson activities for adults). [phone]. [address]  -Virtual Online Classes (a variety of topics): see seniorplanet.org or call [phone]  -consider volunteering at a school, hospice center, church, senior center or elsewhere

## 2023-01-20 ENCOUNTER — Ambulatory Visit: Payer: PPO

## 2023-01-23 ENCOUNTER — Other Ambulatory Visit: Payer: Self-pay | Admitting: Family Medicine

## 2023-01-23 DIAGNOSIS — E782 Mixed hyperlipidemia: Secondary | ICD-10-CM

## 2023-01-27 ENCOUNTER — Telehealth: Payer: Self-pay | Admitting: Family Medicine

## 2023-01-27 MED ORDER — FENOFIBRATE 145 MG PO TABS: 145.0000 mg | ORAL_TABLET | Freq: Every day | ORAL | 3 refills | Status: DC

## 2023-01-27 NOTE — Telephone Encounter (Signed)
We will refill his Fenofibrate

## 2023-01-29 ENCOUNTER — Ambulatory Visit (INDEPENDENT_AMBULATORY_CARE_PROVIDER_SITE_OTHER): Payer: PPO

## 2023-01-29 DIAGNOSIS — Z7901 Long term (current) use of anticoagulants: Secondary | ICD-10-CM | POA: Diagnosis not present

## 2023-01-29 LAB — POCT INR: INR: 2.9 (ref 2.0–3.0)

## 2023-01-29 NOTE — Patient Instructions (Addendum)
Pre visit review using our clinic review tool, if applicable. No additional management support is needed unless otherwise documented below in the visit note.  Continue 1 tablet daily except take 1 1/2 tablets on Sundays and Thursdays. Recheck in 6 weeks.  

## 2023-01-29 NOTE — Progress Notes (Signed)
Continue 1 tablet daily except take 1 1/2 tablets on Sundays and Thursdays. Recheck in 6 weeks.  

## 2023-02-04 DIAGNOSIS — H43813 Vitreous degeneration, bilateral: Secondary | ICD-10-CM | POA: Diagnosis not present

## 2023-02-04 DIAGNOSIS — H34233 Retinal artery branch occlusion, bilateral: Secondary | ICD-10-CM | POA: Diagnosis not present

## 2023-02-04 DIAGNOSIS — H34833 Tributary (branch) retinal vein occlusion, bilateral, with macular edema: Secondary | ICD-10-CM | POA: Diagnosis not present

## 2023-02-10 ENCOUNTER — Other Ambulatory Visit: Payer: Self-pay | Admitting: Family Medicine

## 2023-02-10 DIAGNOSIS — I10 Essential (primary) hypertension: Secondary | ICD-10-CM

## 2023-02-16 ENCOUNTER — Other Ambulatory Visit: Payer: Self-pay | Admitting: Family Medicine

## 2023-02-16 DIAGNOSIS — E782 Mixed hyperlipidemia: Secondary | ICD-10-CM

## 2023-03-05 ENCOUNTER — Telehealth: Payer: Self-pay

## 2023-03-05 NOTE — Telephone Encounter (Signed)
LVM for patient to call back [phone], or to call PCP office to schedule follow up apt. AS, CMA  

## 2023-03-12 ENCOUNTER — Ambulatory Visit: Payer: PPO

## 2023-03-12 ENCOUNTER — Telehealth: Payer: Self-pay

## 2023-03-12 NOTE — Telephone Encounter (Signed)
Pt's wife, Dorene Grebe, called to [facility] apt scheduled for today. She reported pt fell earlier and the fire department came to help get him up. Pt did not have any injuries. She reports he will be too weak to make apt today. [facility] apt for 8/5 for INR check.  Dorene Grebe reports he has falls quite often now but no injuries. Last fall was 2 days ago. She is thankful that the fire department is around the corner from their residence so they come to help get him up if needed.  Advised if any changes to contact office. [name] verbalized understanding.

## 2023-03-16 ENCOUNTER — Other Ambulatory Visit: Payer: Self-pay

## 2023-03-16 ENCOUNTER — Emergency Department (HOSPITAL_COMMUNITY): Payer: PPO

## 2023-03-16 ENCOUNTER — Encounter (HOSPITAL_COMMUNITY): Payer: Self-pay

## 2023-03-16 ENCOUNTER — Inpatient Hospital Stay (HOSPITAL_COMMUNITY)
Admission: EM | Admit: 2023-03-16 | Discharge: 2023-03-27 | DRG: 640 | Disposition: A | Discharge status: Skilled Nursing Facility | Payer: PPO | Attending: Internal Medicine | Admitting: Internal Medicine

## 2023-03-16 DIAGNOSIS — R000 Tachycardia, unspecified: Secondary | ICD-10-CM | POA: Diagnosis not present

## 2023-03-16 DIAGNOSIS — A419 Sepsis, unspecified organism: Secondary | ICD-10-CM | POA: Diagnosis not present

## 2023-03-16 DIAGNOSIS — I10 Essential (primary) hypertension: Secondary | ICD-10-CM | POA: Diagnosis not present

## 2023-03-16 DIAGNOSIS — Z9181 History of falling: Secondary | ICD-10-CM

## 2023-03-16 DIAGNOSIS — I130 Hypertensive heart and chronic kidney disease with heart failure and stage 1 through stage 4 chronic kidney disease, or unspecified chronic kidney disease: Secondary | ICD-10-CM | POA: Diagnosis present

## 2023-03-16 DIAGNOSIS — E860 Dehydration: Secondary | ICD-10-CM | POA: Diagnosis present

## 2023-03-16 DIAGNOSIS — Z6825 Body mass index (BMI) 25.0-25.9, adult: Secondary | ICD-10-CM

## 2023-03-16 DIAGNOSIS — R269 Unspecified abnormalities of gait and mobility: Secondary | ICD-10-CM | POA: Diagnosis present

## 2023-03-16 DIAGNOSIS — Z79899 Other long term (current) drug therapy: Secondary | ICD-10-CM

## 2023-03-16 DIAGNOSIS — F0390 Unspecified dementia without behavioral disturbance: Secondary | ICD-10-CM | POA: Diagnosis present

## 2023-03-16 DIAGNOSIS — Z751 Person awaiting admission to adequate facility elsewhere: Secondary | ICD-10-CM

## 2023-03-16 DIAGNOSIS — T501X5A Adverse effect of loop [high-ceiling] diuretics, initial encounter: Secondary | ICD-10-CM | POA: Diagnosis present

## 2023-03-16 DIAGNOSIS — Z66 Do not resuscitate: Secondary | ICD-10-CM | POA: Diagnosis present

## 2023-03-16 DIAGNOSIS — I4821 Permanent atrial fibrillation: Secondary | ICD-10-CM | POA: Diagnosis present

## 2023-03-16 DIAGNOSIS — K5750 Diverticulosis of both small and large intestine without perforation or abscess without bleeding: Secondary | ICD-10-CM | POA: Diagnosis not present

## 2023-03-16 DIAGNOSIS — R4589 Other symptoms and signs involving emotional state: Secondary | ICD-10-CM

## 2023-03-16 DIAGNOSIS — Z515 Encounter for palliative care: Secondary | ICD-10-CM

## 2023-03-16 DIAGNOSIS — Z85038 Personal history of other malignant neoplasm of large intestine: Secondary | ICD-10-CM

## 2023-03-16 DIAGNOSIS — K5730 Diverticulosis of large intestine without perforation or abscess without bleeding: Secondary | ICD-10-CM | POA: Diagnosis present

## 2023-03-16 DIAGNOSIS — Z9841 Cataract extraction status, right eye: Secondary | ICD-10-CM

## 2023-03-16 DIAGNOSIS — I959 Hypotension, unspecified: Secondary | ICD-10-CM | POA: Diagnosis not present

## 2023-03-16 DIAGNOSIS — I482 Chronic atrial fibrillation, unspecified: Secondary | ICD-10-CM

## 2023-03-16 DIAGNOSIS — J9811 Atelectasis: Secondary | ICD-10-CM | POA: Diagnosis present

## 2023-03-16 DIAGNOSIS — I5032 Chronic diastolic (congestive) heart failure: Secondary | ICD-10-CM | POA: Diagnosis present

## 2023-03-16 DIAGNOSIS — I700 Atherosclerosis of aorta: Secondary | ICD-10-CM | POA: Diagnosis present

## 2023-03-16 DIAGNOSIS — R4182 Altered mental status, unspecified: Secondary | ICD-10-CM | POA: Diagnosis not present

## 2023-03-16 DIAGNOSIS — N1832 Chronic kidney disease, stage 3b: Secondary | ICD-10-CM | POA: Diagnosis present

## 2023-03-16 DIAGNOSIS — N138 Other obstructive and reflux uropathy: Secondary | ICD-10-CM | POA: Diagnosis present

## 2023-03-16 DIAGNOSIS — N320 Bladder-neck obstruction: Secondary | ICD-10-CM | POA: Diagnosis present

## 2023-03-16 DIAGNOSIS — N400 Enlarged prostate without lower urinary tract symptoms: Secondary | ICD-10-CM | POA: Diagnosis not present

## 2023-03-16 DIAGNOSIS — N401 Enlarged prostate with lower urinary tract symptoms: Secondary | ICD-10-CM | POA: Diagnosis present

## 2023-03-16 DIAGNOSIS — Z823 Family history of stroke: Secondary | ICD-10-CM

## 2023-03-16 DIAGNOSIS — E871 Hypo-osmolality and hyponatremia: Secondary | ICD-10-CM | POA: Diagnosis not present

## 2023-03-16 DIAGNOSIS — R531 Weakness: Principal | ICD-10-CM

## 2023-03-16 DIAGNOSIS — E440 Moderate protein-calorie malnutrition: Secondary | ICD-10-CM | POA: Diagnosis present

## 2023-03-16 DIAGNOSIS — Z905 Acquired absence of kidney: Secondary | ICD-10-CM

## 2023-03-16 DIAGNOSIS — H547 Unspecified visual loss: Secondary | ICD-10-CM | POA: Diagnosis present

## 2023-03-16 DIAGNOSIS — Z7901 Long term (current) use of anticoagulants: Secondary | ICD-10-CM

## 2023-03-16 DIAGNOSIS — G9341 Metabolic encephalopathy: Secondary | ICD-10-CM | POA: Diagnosis present

## 2023-03-16 DIAGNOSIS — E872 Acidosis, unspecified: Secondary | ICD-10-CM | POA: Diagnosis present

## 2023-03-16 DIAGNOSIS — Z7401 Bed confinement status: Secondary | ICD-10-CM

## 2023-03-16 DIAGNOSIS — M545 Low back pain, unspecified: Secondary | ICD-10-CM | POA: Diagnosis present

## 2023-03-16 DIAGNOSIS — Z8673 Personal history of transient ischemic attack (TIA), and cerebral infarction without residual deficits: Secondary | ICD-10-CM

## 2023-03-16 DIAGNOSIS — Z8249 Family history of ischemic heart disease and other diseases of the circulatory system: Secondary | ICD-10-CM

## 2023-03-16 DIAGNOSIS — Z9842 Cataract extraction status, left eye: Secondary | ICD-10-CM

## 2023-03-16 DIAGNOSIS — I3139 Other pericardial effusion (noninflammatory): Secondary | ICD-10-CM | POA: Diagnosis not present

## 2023-03-16 DIAGNOSIS — E878 Other disorders of electrolyte and fluid balance, not elsewhere classified: Secondary | ICD-10-CM | POA: Diagnosis present

## 2023-03-16 DIAGNOSIS — E785 Hyperlipidemia, unspecified: Secondary | ICD-10-CM | POA: Diagnosis present

## 2023-03-16 DIAGNOSIS — G8929 Other chronic pain: Secondary | ICD-10-CM | POA: Diagnosis present

## 2023-03-16 DIAGNOSIS — Z7189 Other specified counseling: Secondary | ICD-10-CM

## 2023-03-16 DIAGNOSIS — Z880 Allergy status to penicillin: Secondary | ICD-10-CM

## 2023-03-16 DIAGNOSIS — I7090 Unspecified atherosclerosis: Secondary | ICD-10-CM | POA: Insufficient documentation

## 2023-03-16 MED ORDER — LACTATED RINGERS IV BOLUS (SEPSIS)
500.0000 mL | Freq: Once | INTRAVENOUS | Status: AC
  Administered 2023-03-17: 500 mL via INTRAVENOUS

## 2023-03-16 NOTE — ED Triage Notes (Signed)
Pt. BIB [facility] for generalized weakness. Pt.'s family noticed foul smelling urine. He has also been altered per EMS which has progressively been getting worse. Pt. Given 300 ml of NS by EMS.

## 2023-03-16 NOTE — ED Provider Notes (Signed)
[place] EMERGENCY DEPARTMENT AT [facility] Provider Note   CSN: [id number] Arrival date & time: 03/16/23  2312     History {Add pertinent medical, surgical, social history, OB history to HPI:1} No chief complaint on file.   Justin Gallegos is a 89 y.o. male.  HPI Patient presents for generalized weakness.  Medical history includes HLD, HTN, atrial fibrillation, arthritis, colon cancer, TIA.  Home medications include warfarin, Lasix.  He arrives by EMS from home.  Family reported a progressive confusion and generalized weakness over the past week.  At baseline, he is able to ambulate throughout the home.  Today, he was too weak to stand when attempting to transfer to his bedside commode.  He slid to the ground.  EMS noted atrial fibrillation with intermittent tachycardia.  He was given 350 cc of IVF prior to arrival.  He did seem more alert after IV fluids.  Patient currently denies any areas of discomfort.    Home Medications Prior to Admission medications   Medication Sig Start Date End Date Taking? Authorizing Provider  Aflibercept (EYLEA IO) Inject into the eye.    [provider]  atorvastatin (LIPITOR) 40 MG tablet Take 1 tablet by mouth once daily 11/15/22   Nelwyn Salisbury, MD  Cholecalciferol (VITAMIN D) 125 MCG (5000 UT) CAPS Take 5,000 Units by mouth daily.     [provider]  ezetimibe (ZETIA) 10 MG tablet TAKE 1 TABLET BY MOUTH ONCE DAILY PHYSICAL  WITH  LABS  REQUIRED  FOR  GREATER  QUANTITY  OF  REFILLS 02/17/23   Nelwyn Salisbury, MD  fenofibrate (TRICOR) 145 MG tablet Take 1 tablet (145 mg total) by mouth daily. 01/27/23   Nelwyn Salisbury, MD  furosemide (LASIX) 20 MG tablet Take one tablet every day as needed for fluid 01/28/22   Nelwyn Salisbury, MD  ketoconazole (NIZORAL) 2 % cream APPLY TOPICALLY DAILY 04/19/22   Nelwyn Salisbury, MD  metoprolol succinate (TOPROL-XL) 25 MG 24 hr tablet Take 1 tablet by mouth once daily 02/11/23   Nelwyn Salisbury, MD   tobramycin (TOBREX) 0.3 % ophthalmic solution 1 drop. 01/07/19   [provider]  triamcinolone cream (KENALOG) 0.1 % Apply 1 Application topically 2 (two) times daily. 01/28/22   Nelwyn Salisbury, MD  warfarin (COUMADIN) 1 MG tablet TAKE ONE TABLET BY MOUTH DAILY OR AS DIRECTED BY ANTICOAGULATION CLINIC 04/09/21   Nelwyn Salisbury, MD  warfarin (COUMADIN) 2.5 MG tablet TAKE 1 TABLET BY MOUTH ONCE DAILY EXCEPT TAKE 1 AND 1/2 ON SUNDAYS, WEDNESDAYS AND FRIDAYS OR AS DIRECTED BY ANTICOAGULATION CLINIC 09/17/22   Nelwyn Salisbury, MD      Allergies    Penicillins and Amoxicillin-pot clavulanate    Review of Systems   Review of Systems  Unable to perform ROS: Mental status change  Neurological:  Positive for weakness (Generalized).  Psychiatric/Behavioral:  Positive for confusion.     Physical Exam Updated Vital Signs There were no vitals taken for this visit. Physical Exam Vitals and nursing note reviewed.  Constitutional:      General: He is not in acute distress.    Appearance: He is well-developed. He is not toxic-appearing or diaphoretic.  HENT:     Head: Normocephalic and atraumatic.     Right Ear: External ear normal.     Left Ear: External ear normal.     Nose: Nose normal.     Mouth/Throat:     Mouth:  Mucous membranes are moist.  Eyes:     Extraocular Movements: Extraocular movements intact.     Conjunctiva/sclera: Conjunctivae normal.  Cardiovascular:     Rate and Rhythm: Normal rate. Rhythm irregular.     Heart sounds: No murmur heard. Pulmonary:     Effort: Pulmonary effort is normal. No respiratory distress.     Breath sounds: Normal breath sounds. No wheezing or rales.  Chest:     Chest wall: No tenderness.  Abdominal:     General: There is no distension.     Palpations: Abdomen is soft.     Tenderness: There is no abdominal tenderness.  Musculoskeletal:        General: No swelling. Normal range of motion.     Cervical back: Normal range of motion and neck  supple.     Right lower leg: No edema.     Left lower leg: No edema.  Skin:    General: Skin is warm and dry.     Coloration: Skin is not jaundiced.  Neurological:     General: No focal deficit present.     Mental Status: He is alert. He is disoriented.  Psychiatric:        Mood and Affect: Mood normal.        Behavior: Behavior normal.     ED Results / Procedures / Treatments   Labs (all labs ordered are listed, but only abnormal results are displayed) Labs Reviewed - No data to display  EKG None  Radiology No results found.  Procedures Procedures  {Document cardiac monitor, telemetry assessment procedure when appropriate:1}  Medications Ordered in ED Medications - No data to display  ED Course/ Medical Decision Making/ A&P   {   Click here for ABCD2, HEART and other calculatorsREFRESH Note before signing :1}                              Medical Decision Making  This patient presents to the ED for concern of ***, this involves an extensive number of treatment options, and is a complaint that carries with it a high risk of complications and morbidity.  The differential diagnosis includes ***   Co morbidities that complicate the patient evaluation  ***   Additional history obtained:  Additional history obtained from *** External records from outside source obtained and reviewed including ***   Lab Tests:  I Ordered, and personally interpreted labs.  The pertinent results include:  ***   Imaging Studies ordered:  I ordered imaging studies including ***  I independently visualized and interpreted imaging which showed *** I agree with the radiologist interpretation   Cardiac Monitoring: / EKG:  The patient was maintained on a cardiac monitor.  I personally viewed and interpreted the cardiac monitored which showed an underlying rhythm of: ***   Consultations Obtained:  I requested consultation with the ***,  and discussed lab and imaging findings as  well as pertinent plan - they recommend: ***   Problem List / ED Course / Critical interventions / Medication management  Patient presenting for generalized weakness.***. I ordered medication including ***  for ***  Reevaluation of the patient after these medicines showed that the patient {resolved/improved/worsened:23923::"improved"} I have reviewed the patients home medicines and have made adjustments as needed   Social Determinants of Health:  ***   Test / Admission - Considered:  ***   {Document critical care time when appropriate:1} {Document review of  labs and clinical decision tools ie heart score, Chads2Vasc2 etc:1}  {Document your independent review of radiology images, and any outside records:1} {Document your discussion with family members, caretakers, and with consultants:1} {Document social determinants of health affecting pt's care:1} {Document your decision making why or why not admission, treatments were needed:1} Final Clinical Impression(s) / ED Diagnoses Final diagnoses:  None    Rx / DC Orders ED Discharge Orders     None

## 2023-03-17 ENCOUNTER — Emergency Department (HOSPITAL_COMMUNITY): Payer: PPO

## 2023-03-17 ENCOUNTER — Encounter (HOSPITAL_COMMUNITY): Payer: Self-pay | Admitting: Internal Medicine

## 2023-03-17 ENCOUNTER — Ambulatory Visit: Payer: PPO

## 2023-03-17 DIAGNOSIS — M549 Dorsalgia, unspecified: Secondary | ICD-10-CM | POA: Diagnosis not present

## 2023-03-17 DIAGNOSIS — N401 Enlarged prostate with lower urinary tract symptoms: Secondary | ICD-10-CM

## 2023-03-17 DIAGNOSIS — E782 Mixed hyperlipidemia: Secondary | ICD-10-CM | POA: Diagnosis not present

## 2023-03-17 DIAGNOSIS — I130 Hypertensive heart and chronic kidney disease with heart failure and stage 1 through stage 4 chronic kidney disease, or unspecified chronic kidney disease: Secondary | ICD-10-CM | POA: Diagnosis not present

## 2023-03-17 DIAGNOSIS — G8929 Other chronic pain: Secondary | ICD-10-CM | POA: Diagnosis not present

## 2023-03-17 DIAGNOSIS — I4821 Permanent atrial fibrillation: Secondary | ICD-10-CM | POA: Diagnosis not present

## 2023-03-17 DIAGNOSIS — Z515 Encounter for palliative care: Secondary | ICD-10-CM | POA: Diagnosis not present

## 2023-03-17 DIAGNOSIS — E878 Other disorders of electrolyte and fluid balance, not elsewhere classified: Secondary | ICD-10-CM | POA: Diagnosis not present

## 2023-03-17 DIAGNOSIS — J90 Pleural effusion, not elsewhere classified: Secondary | ICD-10-CM | POA: Diagnosis not present

## 2023-03-17 DIAGNOSIS — I700 Atherosclerosis of aorta: Secondary | ICD-10-CM | POA: Diagnosis not present

## 2023-03-17 DIAGNOSIS — I517 Cardiomegaly: Secondary | ICD-10-CM | POA: Diagnosis not present

## 2023-03-17 DIAGNOSIS — K5730 Diverticulosis of large intestine without perforation or abscess without bleeding: Secondary | ICD-10-CM | POA: Diagnosis not present

## 2023-03-17 DIAGNOSIS — I5022 Chronic systolic (congestive) heart failure: Secondary | ICD-10-CM | POA: Diagnosis not present

## 2023-03-17 DIAGNOSIS — I480 Paroxysmal atrial fibrillation: Secondary | ICD-10-CM | POA: Diagnosis not present

## 2023-03-17 DIAGNOSIS — G9341 Metabolic encephalopathy: Secondary | ICD-10-CM | POA: Diagnosis not present

## 2023-03-17 DIAGNOSIS — N400 Enlarged prostate without lower urinary tract symptoms: Secondary | ICD-10-CM

## 2023-03-17 DIAGNOSIS — R531 Weakness: Secondary | ICD-10-CM | POA: Diagnosis not present

## 2023-03-17 DIAGNOSIS — N138 Other obstructive and reflux uropathy: Secondary | ICD-10-CM | POA: Diagnosis not present

## 2023-03-17 DIAGNOSIS — E872 Acidosis, unspecified: Secondary | ICD-10-CM

## 2023-03-17 DIAGNOSIS — E871 Hypo-osmolality and hyponatremia: Secondary | ICD-10-CM | POA: Diagnosis not present

## 2023-03-17 DIAGNOSIS — N1832 Chronic kidney disease, stage 3b: Secondary | ICD-10-CM | POA: Diagnosis not present

## 2023-03-17 DIAGNOSIS — Z466 Encounter for fitting and adjustment of urinary device: Secondary | ICD-10-CM | POA: Diagnosis not present

## 2023-03-17 DIAGNOSIS — I10 Essential (primary) hypertension: Secondary | ICD-10-CM | POA: Diagnosis not present

## 2023-03-17 DIAGNOSIS — I5032 Chronic diastolic (congestive) heart failure: Secondary | ICD-10-CM | POA: Diagnosis not present

## 2023-03-17 DIAGNOSIS — Z66 Do not resuscitate: Secondary | ICD-10-CM | POA: Diagnosis not present

## 2023-03-17 DIAGNOSIS — Z8673 Personal history of transient ischemic attack (TIA), and cerebral infarction without residual deficits: Secondary | ICD-10-CM | POA: Diagnosis not present

## 2023-03-17 DIAGNOSIS — I482 Chronic atrial fibrillation, unspecified: Secondary | ICD-10-CM | POA: Diagnosis not present

## 2023-03-17 DIAGNOSIS — E785 Hyperlipidemia, unspecified: Secondary | ICD-10-CM | POA: Diagnosis not present

## 2023-03-17 DIAGNOSIS — H547 Unspecified visual loss: Secondary | ICD-10-CM | POA: Diagnosis not present

## 2023-03-17 DIAGNOSIS — Z7401 Bed confinement status: Secondary | ICD-10-CM | POA: Diagnosis not present

## 2023-03-17 DIAGNOSIS — R4589 Other symptoms and signs involving emotional state: Secondary | ICD-10-CM | POA: Diagnosis not present

## 2023-03-17 DIAGNOSIS — R339 Retention of urine, unspecified: Secondary | ICD-10-CM | POA: Diagnosis not present

## 2023-03-17 DIAGNOSIS — I509 Heart failure, unspecified: Secondary | ICD-10-CM | POA: Diagnosis not present

## 2023-03-17 DIAGNOSIS — E860 Dehydration: Secondary | ICD-10-CM | POA: Diagnosis not present

## 2023-03-17 DIAGNOSIS — I7090 Unspecified atherosclerosis: Secondary | ICD-10-CM | POA: Diagnosis not present

## 2023-03-17 DIAGNOSIS — J9811 Atelectasis: Secondary | ICD-10-CM

## 2023-03-17 DIAGNOSIS — R3914 Feeling of incomplete bladder emptying: Secondary | ICD-10-CM | POA: Diagnosis not present

## 2023-03-17 DIAGNOSIS — N320 Bladder-neck obstruction: Secondary | ICD-10-CM | POA: Diagnosis not present

## 2023-03-17 DIAGNOSIS — E440 Moderate protein-calorie malnutrition: Secondary | ICD-10-CM | POA: Diagnosis not present

## 2023-03-17 DIAGNOSIS — F0390 Unspecified dementia without behavioral disturbance: Secondary | ICD-10-CM | POA: Diagnosis not present

## 2023-03-17 DIAGNOSIS — Z7189 Other specified counseling: Secondary | ICD-10-CM | POA: Diagnosis not present

## 2023-03-17 LAB — I-STAT CG4 LACTIC ACID, ED: Lactic Acid, Venous: 1.2 mmol/L (ref 0.5–1.9)

## 2023-03-17 LAB — BASIC METABOLIC PANEL
Anion gap: 9 (ref 5–15)
Anion gap: 7 (ref 5–15)
BUN: 15 mg/dL (ref 8–23)
BUN: 15 mg/dL (ref 8–23)
CO2: 18 mmol/L — ABNORMAL LOW (ref 22–32)
CO2: 20 mmol/L — ABNORMAL LOW (ref 22–32)
Calcium: 7.9 mg/dL — ABNORMAL LOW (ref 8.9–10.3)
Calcium: 8.2 mg/dL — ABNORMAL LOW (ref 8.9–10.3)
Chloride: 99 mmol/L (ref 98–111)
Chloride: 101 mmol/L (ref 98–111)
Creatinine, Ser: 1.35 mg/dL — ABNORMAL HIGH (ref 0.61–1.24)
Creatinine, Ser: 1.33 mg/dL — ABNORMAL HIGH (ref 0.61–1.24)
GFR, Estimated: 49 mL/min — ABNORMAL LOW (ref >60)
GFR, Estimated: 50 mL/min — ABNORMAL LOW (ref >60)
Glucose, Bld: 103 mg/dL — ABNORMAL HIGH (ref 70–99)
Glucose, Bld: 131 mg/dL — ABNORMAL HIGH (ref 70–99)
Potassium: 4.4 mmol/L (ref 3.5–5.1)
Potassium: 3.8 mmol/L (ref 3.5–5.1)
Sodium: 126 mmol/L — ABNORMAL LOW (ref 135–145)
Sodium: 128 mmol/L — ABNORMAL LOW (ref 135–145)

## 2023-03-17 LAB — CBC
HCT: 42.3 % (ref 39.0–52.0)
Hemoglobin: 14.5 g/dL (ref 13.0–17.0)
MCH: 32.3 pg (ref 26.0–34.0)
MCHC: 34.3 g/dL (ref 30.0–36.0)
MCV: 94.2 fL (ref 80.0–100.0)
Platelets: 166 10*3/uL (ref 150–400)
RBC: 4.49 MIL/uL (ref 4.22–5.81)
RDW: 13.8 % (ref 11.5–15.5)
WBC: 7.7 10*3/uL (ref 4.0–10.5)
nRBC: 0 % (ref 0.0–0.2)

## 2023-03-17 LAB — TROPONIN I (HIGH SENSITIVITY): Troponin I (High Sensitivity): 15 ng/L (ref <18)

## 2023-03-17 LAB — NA AND K (SODIUM & POTASSIUM), RAND UR
Potassium Urine: 82 mmol/L
Sodium, Ur: 79 mmol/L

## 2023-03-17 LAB — MAGNESIUM: Magnesium: 2 mg/dL (ref 1.7–2.4)

## 2023-03-17 LAB — OSMOLALITY, URINE: Osmolality, Ur: 535 mOsm/kg (ref 300–900)

## 2023-03-17 MED ORDER — WARFARIN - PHARMACIST DOSING INPATIENT: Freq: Every day | Status: DC

## 2023-03-17 MED ORDER — ORAL CARE MOUTH RINSE
15.0000 mL | OROMUCOSAL | Status: DC
  Administered 2023-03-17 – 2023-03-27 (×34): 15 mL via OROMUCOSAL

## 2023-03-17 MED ORDER — METOPROLOL SUCCINATE ER 50 MG PO TB24
25.0000 mg | ORAL_TABLET | Freq: Every day | ORAL | Status: DC
  Administered 2023-03-17 – 2023-03-21 (×5): 25 mg via ORAL
  Filled 2023-03-17 (×5): qty 1

## 2023-03-17 MED ORDER — TAMSULOSIN HCL 0.4 MG PO CAPS
0.4000 mg | ORAL_CAPSULE | Freq: Every day | ORAL | Status: DC
  Administered 2023-03-17 – 2023-03-27 (×11): 0.4 mg via ORAL
  Filled 2023-03-17 (×11): qty 1

## 2023-03-17 MED ORDER — SODIUM CHLORIDE 0.9 % IV SOLN: INTRAVENOUS | Status: DC

## 2023-03-17 MED ORDER — MAGNESIUM SULFATE 2 GM/50ML IV SOLN
2.0000 g | Freq: Once | INTRAVENOUS | Status: AC
  Administered 2023-03-17: 2 g via INTRAVENOUS
  Filled 2023-03-17: qty 50

## 2023-03-17 MED ORDER — ACETAMINOPHEN 325 MG PO TABS: 650.0000 mg | ORAL_TABLET | Freq: Four times a day (QID) | ORAL | Status: DC | PRN

## 2023-03-17 MED ORDER — EZETIMIBE 10 MG PO TABS
10.0000 mg | ORAL_TABLET | Freq: Every day | ORAL | Status: DC
  Administered 2023-03-17 – 2023-03-27 (×11): 10 mg via ORAL
  Filled 2023-03-17 (×11): qty 1

## 2023-03-17 MED ORDER — CALCIUM GLUCONATE-NACL 1-0.675 GM/50ML-% IV SOLN
1.0000 g | Freq: Once | INTRAVENOUS | Status: AC
  Administered 2023-03-17: 1000 mg via INTRAVENOUS
  Filled 2023-03-17: qty 50

## 2023-03-17 MED ORDER — ORAL CARE MOUTH RINSE: 15.0000 mL | OROMUCOSAL | Status: DC | PRN

## 2023-03-17 MED ORDER — SODIUM CHLORIDE 0.9% FLUSH
3.0000 mL | Freq: Two times a day (BID) | INTRAVENOUS | Status: DC
  Administered 2023-03-17 – 2023-03-27 (×12): 3 mL via INTRAVENOUS

## 2023-03-17 MED ORDER — HYDRALAZINE HCL 20 MG/ML IJ SOLN: 5.0000 mg | Freq: Three times a day (TID) | INTRAMUSCULAR | Status: DC | PRN

## 2023-03-17 MED ORDER — CHLORHEXIDINE GLUCONATE CLOTH 2 % EX PADS
6.0000 | MEDICATED_PAD | Freq: Every day | CUTANEOUS | Status: DC
  Administered 2023-03-18 – 2023-03-27 (×11): 6 via TOPICAL

## 2023-03-17 MED ORDER — HYDRALAZINE HCL 25 MG PO TABS
25.0000 mg | ORAL_TABLET | Freq: Four times a day (QID) | ORAL | Status: DC | PRN
  Administered 2023-03-17 – 2023-03-19 (×2): 25 mg via ORAL
  Filled 2023-03-17 (×2): qty 1

## 2023-03-17 MED ORDER — ONDANSETRON HCL 4 MG/2ML IJ SOLN: 4.0000 mg | Freq: Four times a day (QID) | INTRAMUSCULAR | Status: DC | PRN

## 2023-03-17 MED ORDER — FENOFIBRATE 54 MG PO TABS
54.0000 mg | ORAL_TABLET | Freq: Every day | ORAL | Status: DC
  Administered 2023-03-17 – 2023-03-27 (×11): 54 mg via ORAL
  Filled 2023-03-17 (×11): qty 1

## 2023-03-17 MED ORDER — ACETAMINOPHEN 650 MG RE SUPP: 650.0000 mg | Freq: Four times a day (QID) | RECTAL | Status: DC | PRN

## 2023-03-17 MED ORDER — SODIUM CHLORIDE 0.9% FLUSH
3.0000 mL | INTRAVENOUS | Status: DC | PRN
  Administered 2023-03-17 (×2): 3 mL via INTRAVENOUS

## 2023-03-17 MED ORDER — ATORVASTATIN CALCIUM 40 MG PO TABS
40.0000 mg | ORAL_TABLET | Freq: Every day | ORAL | Status: DC
  Administered 2023-03-17 – 2023-03-27 (×11): 40 mg via ORAL
  Filled 2023-03-17 (×11): qty 1

## 2023-03-17 MED ORDER — ONDANSETRON HCL 4 MG PO TABS: 4.0000 mg | ORAL_TABLET | Freq: Four times a day (QID) | ORAL | Status: DC | PRN

## 2023-03-17 MED ORDER — ENSURE ENLIVE PO LIQD
237.0000 mL | Freq: Two times a day (BID) | ORAL | Status: DC
  Administered 2023-03-17 – 2023-03-18 (×2): 237 mL via ORAL

## 2023-03-17 MED ORDER — SODIUM CHLORIDE 0.9 % IV SOLN: 250.0000 mL | INTRAVENOUS | Status: DC | PRN

## 2023-03-17 NOTE — ED Notes (Signed)
Assisted pt with urinal 300

## 2023-03-17 NOTE — Progress Notes (Addendum)
PROGRESS NOTE    Justin Gallegos  VQQ:[id number] DOB: 1928-11-02 DOA: 03/16/2023 PCP: Nelwyn Salisbury, MD    Brief Narrative:   Justin Gallegos is a 89 y.o. male with past medical history significant for chronic diastolic congestive heart failure (LVEF 55-60%), HTN, HLD, permanent atrial fibrillation on warfarin, history of TIA, chronic low back pain, hemorrhoids who presented to [facility] ED on 8/5 via EMS from home with progressive weakness, confusion; ongoing x 1 week.  At baseline, able to ambulate at home however was too weak to stand when attempting to transfer to his bedside commode.  On EMS arrival, patient was found to be in atrial fibrillation with intermittent tachycardia.  He was given 300 cc of IV fluids and brought to the ED for further evaluation.  In the ED, temperature 97.8 F, HR 104, RR 18, BP 134/99, SpO2 97% on room air.  WBC 9.8, hemoglobin 14.1, platelet count 194.  Sodium 125, potassium 4.2, chloride 97, CO2 17, glucose 114, BUN 15, creatinine 1.48.  Magnesium 1.6.  AST 27, ALT 20, total bilirubin 1.0.  High sensitive troponin 13.  Lactic acid 1.8.  CT head obtained which did not show any acute intracranial process, noted remote right cerebral infraction. CT chest stand abdomen pelvis showed cardiomegaly, coronary disease, aortic atherosclerosis.  Bibasilar atelectasis.  Colonic diverticulosis.  Prostate enlargement.  Bladder wall thickening and trabeculation with severe diverticular compatible of chronic bladder outlet obstruction.  No acute finding. Patient received LR 500 bolus.  Magnesium 2 g and started on NS 75 cc/h. Called urology Dr.Machen regarding the bladder obstruction and BPH with recommendation for foley and start Flomax.   TRH consulted for admission for further evaluation and management of acute metabolic encephalopathy, hyponatremia.  Assessment & Plan:   Hyponatremia Hypochloremia Patient presenting with sodium 125, chloride 97.  Complicated by  home Lasix use.  CT head unremarkable except right cerebral hemisphere remote infarction.  Urine sodium 79.  Urine SG 1.017 within normal limits.  Etiology likely secondary to DHI hydration, poor oral intake as well as home Lasix use. -- Na 125>128>126 -- NS at 75 mL/h -- BMP q6h -- goal correction of Na 8-10 meq over 24h  Hypocalcemia Serum calcium 8.3, ionized calcium 1.10 --Calcium gluconate 1 g IV x 1 today -- Repeat CMP, ionized calcium in the a.m.  Hypomagnesemia Magnesium 1.6, repleted. -- Repeat magnesium level in a.m.  Acute metabolic encephalopathy Etiology likely secondary to poor oral intake, electrolyte disturbance.  Patient is afebrile without leukocytosis.  CT head without contrast with no acute intracranial hemorrhage or infarct, progressive parenchymal volume loss, remote right cerebellar hemisphere infarct, which is new since prior examination.  CT chest/abdomen/pelvis with no acute findings.  -- Continue supportive care  Chronic bladder outlet obstruction BPH CT Abdomen/pelvis with bladder wall thickening, trabeculation with severe diverticula compatible with chronic bladder outlet obstruction and prostate enlargement.  No previous history of BPH in the past.  Admitting physician discussed with urology, Dr. Lafonda Mosses; recommended Foley catheter placement and initiation of Flomax. --Continue Foley catheter -- Flomax 0.4 mg p.o. daily  Chronic/permanent atrial fibrillation on anticoagulation Essential hypertension Chronic diastolic congestive heart failure, compensated TTE 04/10/2020 with LVEF 55 to 60%, moderate LVH, no LV regional wall motion abnormalities, IVC normal in size, no aortic stenosis. -- Metoprolol succinate 25 mg p.o. daily --Holding home furosemide as above -- Warfarin, pharmacy consulted for dosing/monitoring -- INR daily  Hyperlipidemia Aortic atherosclerosis --Atorvastatin -- Fenofibrate  CKD stage IIIb  Creatinine baseline 1.5-1.8.  Creatinine  1.60 on admission, at baseline. -- BMP daily  Weakness/debility/deconditioning/gait disturbance: Patient resides at home. --PT/OT evaluation   DVT prophylaxis: SCDs Start: 03/17/23 0547    Code Status: DNR Family Communication: No family present at bedside this morning  Disposition Plan:  Level of care: Progressive Status is: Inpatient Remains inpatient appropriate because: IV fluids, PT/OT evaluation, needs improvement of confusion and sodium level before safe for discharge home, anticipate 2-3 days    Consultants:  Urology  Procedures:  Foley catheter placement 8/5  Antimicrobials:  None   Subjective: Patient seen examined bedside, resting calmly.  Lying in bed.  Remains confused.  No family present.  Foley catheter just placed.  Unable to obtain any further ROS.  Remains on IV fluids.  Awaiting bed placement.  No acute concerns overnight per nurse staff.  Objective: Vitals:   03/17/23 0437 03/17/23 0800 03/17/23 0821 03/17/23 1101  BP: (!) 151/94 (!) 156/94  (!) 146/97  Pulse: 93 94  96  Resp: 18 19  (!) 22  Temp: 97.8 F (36.6 C)  98 F (36.7 C)   TempSrc: Oral  Oral   SpO2: 98% 97%  97%   No intake or output data in the 24 hours ending 03/17/23 1159 There were no vitals filed for this visit.  Examination:  Physical Exam: GEN: NAD, alert, oriented to only his name/date of birth, not to time/place/person/situation, chronically ill/elderly in appearance HEENT: NCAT, PERRL, EOMI, sclera clear, dry mucous membranes PULM: CTAB w/o wheezes/crackles, normal respiratory effort, on room air CV: Tachycardic, irregularly irregular rhythm w/o M/G/R GI: abd soft, NTND, NABS, no R/G/M MSK: no peripheral edema, moves all extremities independently NEURO: No focal neurological deficits Integumentary: No concerning rashes/lesions/wounds noted on exposed skin surfaces    Data Reviewed: I have personally reviewed following labs and imaging studies  CBC: Recent Labs   Lab 03/17/23 0015 03/17/23 0024 03/17/23 0605  WBC 9.8  --  7.7  NEUTROABS 7.4  --   --   HGB 14.1 13.6 14.5  HCT 40.3 40.0 42.3  MCV 93.5  --  94.2  PLT 194  --  166   Basic Metabolic Panel: Recent Labs  Lab 03/17/23 0015 03/17/23 0024 03/17/23 0605  NA 125* 128* 126*  K 4.2 4.3 4.4  CL 97* 97* 99  CO2 17*  --  18*  GLUCOSE 114* 113* 103*  BUN 15 14 15   CREATININE 1.48* 1.60* 1.35*  CALCIUM 8.3*  --  7.9*  MG 1.6*  --  2.0   GFR: CrCl cannot be calculated (Unknown ideal weight.). Liver Function Tests: Recent Labs  Lab 03/17/23 0015  AST 27  ALT 20  ALKPHOS 44  BILITOT 1.0  PROT 5.7*  ALBUMIN 3.0*   No results for input(s): "LIPASE", "AMYLASE" in the last 168 hours. No results for input(s): "AMMONIA" in the last 168 hours. Coagulation Profile: Recent Labs  Lab 03/17/23 0015  INR 3.6*   Cardiac Enzymes: No results for input(s): "CKTOTAL", "CKMB", "CKMBINDEX", "TROPONINI" in the last 168 hours. BNP (last 3 results) No results for input(s): "PROBNP" in the last 8760 hours. HbA1C: No results for input(s): "HGBA1C" in the last 72 hours. CBG: No results for input(s): "GLUCAP" in the last 168 hours. Lipid Profile: No results for input(s): "CHOL", "HDL", "LDLCALC", "TRIG", "CHOLHDL", "LDLDIRECT" in the last 72 hours. Thyroid Function Tests: No results for input(s): "TSH", "T4TOTAL", "FREET4", "T3FREE", "THYROIDAB" in the last 72 hours. Anemia Panel: No results for  input(s): "VITAMINB12", "FOLATE", "FERRITIN", "TIBC", "IRON", "RETICCTPCT" in the last 72 hours. Sepsis Labs: Recent Labs  Lab 03/17/23 0024  LATICACIDVEN 1.8    No results found for this or any previous visit (from the past 240 hour(s)).       Radiology Studies: CT CHEST ABDOMEN PELVIS WO CONTRAST  Result Date: 03/17/2023 CLINICAL DATA:  Sepsis EXAM: CT CHEST, ABDOMEN AND PELVIS WITHOUT CONTRAST TECHNIQUE: Multidetector CT imaging of the chest, abdomen and pelvis was performed  following the standard protocol without IV contrast. RADIATION DOSE REDUCTION: This exam was performed according to the departmental dose-optimization program which includes automated exposure control, adjustment of the mA and/or kV according to patient size and/or use of iterative reconstruction technique. COMPARISON:  11/16/2013 FINDINGS: CT CHEST FINDINGS Cardiovascular: Mild cardiomegaly. Small pericardial effusion. Diffuse coronary artery and aortic atherosclerosis. No aneurysm. Mediastinum/Nodes: No mediastinal, hilar, or axillary adenopathy. Trachea and esophagus are unremarkable. Thyroid unremarkable. Lungs/Pleura: Bibasilar atelectasis. No effusions or pneumothorax. No confluent airspace opacities. Calcified granuloma posteriorly in the left upper lobe. Musculoskeletal: Chest wall soft tissues are unremarkable. No acute bony abnormality. CT ABDOMEN PELVIS FINDINGS Hepatobiliary: No focal hepatic abnormality. Gallbladder unremarkable. Pancreas: No focal abnormality or ductal dilatation. Spleen: No focal abnormality.  Normal size. Adrenals/Urinary Tract: Prior left nephrectomy. Adrenal glands unremarkable. No right renal stone, hydronephrosis or mass. Bladder wall is thickened and irregular with diverticula, likely related to chronic bladder outlet obstruction. Stomach/Bowel: Diffuse colonic diverticulosis, most pronounced in the left colon. No diverticulitis. Duodenal diverticula noted in the descending portion of the duodenum. Stomach unremarkable. No bowel obstruction. Vascular/Lymphatic: Aortic atherosclerosis. No evidence of aneurysm or adenopathy. Reproductive: Prostate enlargement Other: No free fluid or free air. Musculoskeletal: No acute bony abnormality. IMPRESSION: Cardiomegaly, coronary artery disease.  Aortic atherosclerosis. Bibasilar atelectasis. Colonic diverticulosis.  No active diverticulitis. Bladder wall thickening and trabeculation with several diverticula compatible with chronic bladder  outlet obstruction. Prostate enlargement. No acute findings. Electronically Signed   By: Charlett Nose M.D.   On: 03/17/2023 00:44   CT Head Wo Contrast  Result Date: 03/17/2023 CLINICAL DATA:  Generalized weakness, altered mental status EXAM: CT HEAD WITHOUT CONTRAST TECHNIQUE: Contiguous axial images were obtained from the base of the skull through the vertex without intravenous contrast. RADIATION DOSE REDUCTION: This exam was performed according to the departmental dose-optimization program which includes automated exposure control, adjustment of the mA and/or kV according to patient size and/or use of iterative reconstruction technique. COMPARISON:  CT head 07/28/2010 CT abdomen pelvis 11/16/2013 FINDINGS: CT HEAD FINDINGS Brain: Progressive parenchymal volume loss is commensurate with the patient's age. No acute intracranial hemorrhage or infarct. No abnormal mass effect or midline shift. No abnormal intra or extra-axial mass lesion or fluid collection. Ventricular size is prominent, but commensurate with the degree of parenchymal atrophy. Remote infarct within the right cerebellar hemisphere noted, new since prior examination. Vascular: No asymmetric hyperdense vasculature at the skull base. Skull: Normal. Negative for fracture or focal lesion. Sinuses/Orbits: No acute finding. Other: Mastoid air cells and middle ear cavities are are clear. IMPRESSION: 1. No acute intracranial hemorrhage or infarct. 2. Progressive parenchymal volume loss, commensurate with the patient's age. 3. Remote right cerebellar hemisphere infarct, new since prior examination. Electronically Signed   By: Helyn Numbers M.D.   On: 03/17/2023 00:18        Scheduled Meds:  atorvastatin  40 mg Oral Daily   ezetimibe  10 mg Oral Daily   fenofibrate  54 mg Oral Daily   metoprolol  succinate  25 mg Oral Daily   sodium chloride flush  3 mL Intravenous Q12H   tamsulosin  0.4 mg Oral Daily   Warfarin - Pharmacist Dosing Inpatient    Does not apply q1600   Continuous Infusions:  sodium chloride     sodium chloride 75 mL/hr at 03/17/23 0710     LOS: 0 days    Time spent: 56 minutes spent on chart review, discussion with nursing staff, consultants, updating family and interview/physical exam; more than 50% of that time was spent in counseling and/or coordination of care.    Alvira Philips [place], DO [facility] Available via Epic secure chat 7am-7pm After these hours, please refer to coverage provider listed on [email or web address] 03/17/2023, 11:59 AM

## 2023-03-17 NOTE — ED Notes (Signed)
Urology at bedside due to consult that was placed on patient

## 2023-03-17 NOTE — ED Notes (Signed)
Critical Blood gas PCO2 14 PO2 30

## 2023-03-17 NOTE — H&P (Signed)
History and Physical    Justin Gallegos ZOX:[id number] DOB: 1929-07-13 DOA: 03/16/2023  PCP: Nelwyn Salisbury, MD   Patient coming from: Home   Chief Complaint:  Chief Complaint  Patient presents with   Weakness    HPI:  Justin Gallegos is a 89 y.o. male with medical history significant of diastolic heart failure with preserved EF 55 to 60%, essential hypertension, hyperlipidemia, atrial fibrillation on warfarin, transient ischemic attack, chronic lower back pain, and hemorrhoid presented to emergency department due to generalized weakness brought in by EMS. Patient's family reported that patient was found progressively confused and generalized weakness over the course of last 1 week.  At baseline he ambulates at home.  On however today he was too weak to stand when attempting to transfer to his bedside commode.  He is slightly tilted to the ground.  Per EMS report patient found atrial fibrillation with intermittent tachycardia.  He was resuscitated with 350 cc of IV fluid and seem to fairly alert after that.   ED Course:  At presentation to ED heart rate 104, respiratory to 18, blood pressure 130/99 and O2 sat 97% room air. Lab Work, CMP significant for low sodium 125, low chloride 97, potassium 4.2, low bicarb 17, blood Kos 117, creatinine 1.4 (baseline around 1.7), calcium 8.3, low protein 5.7, low albumin 3, low GFR 44, normal hepatic panel and normal anion gap 11. Low mag 1.6. CBC grossly unremarkable. VBG showed pH 7.2, pCO2 18, pO2 32, bicarb 6.1 and acid-base deficit 18. UA unremarkable. Urine sodium 79 and potassium 82.  Pending serum and urine osmolarity.  Due to confusion and altered mentation CT head obtained which did not show any acute intracranial process.  Remote right cerebral infraction. CT chest stand abdomen pelvis showed cardiomegaly, coronary disease, aortic atherosclerosis.  Bibasilar atelectasis.  Colonic diverticulosis.  Prostate enlargement.  Bladder wall  thickening and trabeculation with severe diverticular compatible of chronic bladder outlet obstruction.  No acute finding.  In the ED patient received LR 500 bolus.  Magnesium 2 g and started on NS 75 cc/h.  Hospitalist has been consulted to admit patient for the management of hyponatremia.  Called urology Dr.Machen regarding the bladder obstruction and BPH.  Recommended try to get an Foley if unsuccessful get Coude foley and start Flomax.  If unsuccessful to place Foley or Coude need to reach out to urology for formal consult and further recommendation.  Review of Systems:  Review of Systems  Constitutional:  Negative for chills, fever and malaise/fatigue.  Respiratory:  Negative for cough and shortness of breath.   Cardiovascular:  Negative for chest pain and palpitations.  Gastrointestinal:  Negative for abdominal pain, heartburn, nausea and vomiting.       Suprapubic pain  Genitourinary:  Negative for dysuria, frequency and urgency.  Neurological:  Negative for dizziness and headaches.  Psychiatric/Behavioral:  The patient is not nervous/anxious.     Past Medical History:  Diagnosis Date   Arthritis    Atrial fibrillation [facility])    sees Dr. Dietrich Pates    [facility])    ureteral, per Dr. Laverle Patter    Eye disease    sees Dr. Stephannie Li    Hemorrhoids    Hyperlipidemia    Hypertension    Low back pain    TIA (transient ischemic attack)     Past Surgical History:  Procedure Laterality Date   APPENDECTOMY     CATARACT EXTRACTION, BILATERAL  2013   per Dr. Dione Booze  COLONOSCOPY  06-28-10   per Dr. Leone Payor, benign polyps and severe diverticulosis, no repeats needed    HERNIA REPAIR     NEPHRECTOMY     TONSILLECTOMY     URETERECTOMY       reports that he has never smoked. He has never used smokeless tobacco. He reports that he does not drink alcohol and does not use drugs.  Allergies  Allergen Reactions   Penicillins     Unable to breathe    Amoxicillin-Pot  Clavulanate     REACTION: throat swelling    Family History  Problem Relation Age of Onset   Stroke Father    Coronary artery disease Other        fhx   Stroke Other        fhx    Prior to Admission medications   Medication Sig Start Date End Date Taking? Authorizing Provider  acetaminophen (TYLENOL) 500 MG tablet Take 500 mg by mouth every 6 (six) hours as needed for moderate pain.   Yes [provider]  Aflibercept (EYLEA IO) Inject into the eye.   Yes [provider]  atorvastatin (LIPITOR) 40 MG tablet Take 1 tablet by mouth once daily 11/15/22  Yes Nelwyn Salisbury, MD  Cholecalciferol (VITAMIN D) 125 MCG (5000 UT) CAPS Take 5,000 Units by mouth daily.    Yes [provider]  ezetimibe (ZETIA) 10 MG tablet TAKE 1 TABLET BY MOUTH ONCE DAILY PHYSICAL  WITH  LABS  REQUIRED  FOR  GREATER  QUANTITY  OF  REFILLS 02/17/23  Yes Nelwyn Salisbury, MD  fenofibrate (TRICOR) 145 MG tablet Take 1 tablet (145 mg total) by mouth daily. 01/27/23  Yes Nelwyn Salisbury, MD  metoprolol succinate (TOPROL-XL) 25 MG 24 hr tablet Take 1 tablet by mouth once daily 02/11/23  Yes Nelwyn Salisbury, MD  warfarin (COUMADIN) 2.5 MG tablet TAKE 1 TABLET BY MOUTH ONCE DAILY EXCEPT TAKE 1 AND 1/2 ON SUNDAYS, WEDNESDAYS AND FRIDAYS OR AS DIRECTED BY ANTICOAGULATION CLINIC Patient taking differently: Take 2.5-3.75 mg by mouth as directed. TAKE 1 TABLET (2.5 MG) BY MOUTH ONCE DAILY EXCEPT TAKE 1 AND 1/2 TABLETS (3.75 MG) ON SUNDAYS AND THURSDAYS AS DIRECTED BY ANTICOAGULATION CLINIC 09/17/22  Yes Nelwyn Salisbury, MD  furosemide (LASIX) 20 MG tablet Take one tablet every day as needed for fluid Patient not taking: Reported on 03/17/2023 01/28/22   Nelwyn Salisbury, MD  ketoconazole (NIZORAL) 2 % cream APPLY TOPICALLY DAILY Patient not taking: Reported on 03/17/2023 04/19/22   Nelwyn Salisbury, MD  tobramycin (TOBREX) 0.3 % ophthalmic solution Place 1 drop into both eyes as directed. INSTILL 1 DROP INTO RIGHT EYE 4 TIMES  DAILY; BEGIN 1 DAY PRIOR TO TREATMENT, CONTINUE THE DAY OF TREATMENT AND ONE DAY AFTER TREATMENT. 01/07/19   [provider]  triamcinolone cream (KENALOG) 0.1 % Apply 1 Application topically 2 (two) times daily. Patient not taking: Reported on 03/17/2023 01/28/22   Nelwyn Salisbury, MD  warfarin (COUMADIN) 1 MG tablet TAKE ONE TABLET BY MOUTH DAILY OR AS DIRECTED BY ANTICOAGULATION CLINIC Patient not taking: Reported on 03/17/2023 04/09/21   Nelwyn Salisbury, MD     Physical Exam: Vitals:   03/16/23 2345 03/17/23 0437  BP: (!) 134/99 (!) 151/94  Pulse: (!) 104 93  Resp: 18 18  Temp: 97.8 F (36.6 C) 97.8 F (36.6 C)  TempSrc: Oral Oral  SpO2: 97% 98%    Physical Exam Constitutional:  Comments: Pleasantly confused  HENT:     Head: Normocephalic.     Mouth/Throat:     Mouth: Mucous membranes are moist.  Eyes:     Pupils: Pupils are equal, round, and reactive to light.  Cardiovascular:     Rate and Rhythm: Rhythm irregular.     Pulses: Normal pulses.  Pulmonary:     Effort: No respiratory distress.     Breath sounds: Normal breath sounds. No stridor.  Abdominal:     General: Bowel sounds are normal. There is no distension.     Tenderness: There is no abdominal tenderness. There is no guarding or rebound.     Comments: Suprapubic tenderness on palpation.  Suprapubic abdominal fullness.  Musculoskeletal:     Cervical back: Neck supple.     Right lower leg: No edema.     Left lower leg: No edema.  Skin:    Capillary Refill: Capillary refill takes less than 2 seconds.  Neurological:     Mental Status: He is alert.     Cranial Nerves: No cranial nerve deficit.     Sensory: No sensory deficit.     Motor: No weakness.     Comments: Alert oriented to name only.  Psychiatric:        Mood and Affect: Mood normal.      Labs on Admission: I have personally reviewed following labs and imaging studies  CBC: Recent Labs  Lab 03/17/23 0015 03/17/23 0024 03/17/23 0605   WBC 9.8  --  7.7  NEUTROABS 7.4  --   --   HGB 14.1 13.6 14.5  HCT 40.3 40.0 42.3  MCV 93.5  --  94.2  PLT 194  --  166   Basic Metabolic Panel: Recent Labs  Lab 03/17/23 0015 03/17/23 0024 03/17/23 0605  NA 125* 128* 126*  K 4.2 4.3 4.4  CL 97* 97* 99  CO2 17*  --  18*  GLUCOSE 114* 113* 103*  BUN 15 14 15   CREATININE 1.48* 1.60* 1.35*  CALCIUM 8.3*  --  7.9*  MG 1.6*  --   --    GFR: CrCl cannot be calculated (Unknown ideal weight.). Liver Function Tests: Recent Labs  Lab 03/17/23 0015  AST 27  ALT 20  ALKPHOS 44  BILITOT 1.0  PROT 5.7*  ALBUMIN 3.0*   No results for input(s): "LIPASE", "AMYLASE" in the last 168 hours. No results for input(s): "AMMONIA" in the last 168 hours. Coagulation Profile: Recent Labs  Lab 03/17/23 0015  INR 3.6*   Cardiac Enzymes: Recent Labs  Lab 03/17/23 0015  TROPONINIHS 13   BNP (last 3 results) No results for input(s): "BNP" in the last 8760 hours. HbA1C: No results for input(s): "HGBA1C" in the last 72 hours. CBG: No results for input(s): "GLUCAP" in the last 168 hours. Lipid Profile: No results for input(s): "CHOL", "HDL", "LDLCALC", "TRIG", "CHOLHDL", "LDLDIRECT" in the last 72 hours. Thyroid Function Tests: No results for input(s): "TSH", "T4TOTAL", "FREET4", "T3FREE", "THYROIDAB" in the last 72 hours. Anemia Panel: No results for input(s): "VITAMINB12", "FOLATE", "FERRITIN", "TIBC", "IRON", "RETICCTPCT" in the last 72 hours. Urine analysis:    Component Value Date/Time   COLORURINE YELLOW 03/17/2023 0211   APPEARANCEUR CLEAR 03/17/2023 0211   LABSPEC 1.017 03/17/2023 0211   PHURINE 6.0 03/17/2023 0211   GLUCOSEU NEGATIVE 03/17/2023 0211   HGBUR NEGATIVE 03/17/2023 0211   HGBUR small 03/16/2010 0855   BILIRUBINUR NEGATIVE 03/17/2023 0211   BILIRUBINUR neg 07/18/2016 1107  KETONESUR NEGATIVE 03/17/2023 0211   PROTEINUR NEGATIVE 03/17/2023 0211   UROBILINOGEN 0.2 07/18/2016 1107   UROBILINOGEN 0.2  07/28/2010 2150   NITRITE NEGATIVE 03/17/2023 0211   LEUKOCYTESUR NEGATIVE 03/17/2023 0211    Radiological Exams on Admission: I have personally reviewed images CT CHEST ABDOMEN PELVIS WO CONTRAST  Result Date: 03/17/2023 CLINICAL DATA:  Sepsis EXAM: CT CHEST, ABDOMEN AND PELVIS WITHOUT CONTRAST TECHNIQUE: Multidetector CT imaging of the chest, abdomen and pelvis was performed following the standard protocol without IV contrast. RADIATION DOSE REDUCTION: This exam was performed according to the departmental dose-optimization program which includes automated exposure control, adjustment of the mA and/or kV according to patient size and/or use of iterative reconstruction technique. COMPARISON:  11/16/2013 FINDINGS: CT CHEST FINDINGS Cardiovascular: Mild cardiomegaly. Small pericardial effusion. Diffuse coronary artery and aortic atherosclerosis. No aneurysm. Mediastinum/Nodes: No mediastinal, hilar, or axillary adenopathy. Trachea and esophagus are unremarkable. Thyroid unremarkable. Lungs/Pleura: Bibasilar atelectasis. No effusions or pneumothorax. No confluent airspace opacities. Calcified granuloma posteriorly in the left upper lobe. Musculoskeletal: Chest wall soft tissues are unremarkable. No acute bony abnormality. CT ABDOMEN PELVIS FINDINGS Hepatobiliary: No focal hepatic abnormality. Gallbladder unremarkable. Pancreas: No focal abnormality or ductal dilatation. Spleen: No focal abnormality.  Normal size. Adrenals/Urinary Tract: Prior left nephrectomy. Adrenal glands unremarkable. No right renal stone, hydronephrosis or mass. Bladder wall is thickened and irregular with diverticula, likely related to chronic bladder outlet obstruction. Stomach/Bowel: Diffuse colonic diverticulosis, most pronounced in the left colon. No diverticulitis. Duodenal diverticula noted in the descending portion of the duodenum. Stomach unremarkable. No bowel obstruction. Vascular/Lymphatic: Aortic atherosclerosis. No evidence of  aneurysm or adenopathy. Reproductive: Prostate enlargement Other: No free fluid or free air. Musculoskeletal: No acute bony abnormality. IMPRESSION: Cardiomegaly, coronary artery disease.  Aortic atherosclerosis. Bibasilar atelectasis. Colonic diverticulosis.  No active diverticulitis. Bladder wall thickening and trabeculation with several diverticula compatible with chronic bladder outlet obstruction. Prostate enlargement. No acute findings. Electronically Signed   By: Charlett Nose M.D.   On: 03/17/2023 00:44   CT Head Wo Contrast  Result Date: 03/17/2023 CLINICAL DATA:  Generalized weakness, altered mental status EXAM: CT HEAD WITHOUT CONTRAST TECHNIQUE: Contiguous axial images were obtained from the base of the skull through the vertex without intravenous contrast. RADIATION DOSE REDUCTION: This exam was performed according to the departmental dose-optimization program which includes automated exposure control, adjustment of the mA and/or kV according to patient size and/or use of iterative reconstruction technique. COMPARISON:  CT head 07/28/2010 CT abdomen pelvis 11/16/2013 FINDINGS: CT HEAD FINDINGS Brain: Progressive parenchymal volume loss is commensurate with the patient's age. No acute intracranial hemorrhage or infarct. No abnormal mass effect or midline shift. No abnormal intra or extra-axial mass lesion or fluid collection. Ventricular size is prominent, but commensurate with the degree of parenchymal atrophy. Remote infarct within the right cerebellar hemisphere noted, new since prior examination. Vascular: No asymmetric hyperdense vasculature at the skull base. Skull: Normal. Negative for fracture or focal lesion. Sinuses/Orbits: No acute finding. Other: Mastoid air cells and middle ear cavities are are clear. IMPRESSION: 1. No acute intracranial hemorrhage or infarct. 2. Progressive parenchymal volume loss, commensurate with the patient's age. 3. Remote right cerebellar hemisphere infarct, new  since prior examination. Electronically Signed   By: Helyn Numbers M.D.   On: 03/17/2023 00:18    EKG: My personal interpretation of EKG shows: Atrial fibrillation rate controlled 102.  No ST and T wave abnormality.    Assessment/Plan: Principal Problem:   Hyponatremia Active Problems:   Hypomagnesemia  Acute metabolic encephalopathy   BPH (benign prostatic hyperplasia)   Mild bibasilar atelectasis   Chronic diastolic CHF (congestive heart failure) (HCC)   Atrial fibrillation, chronic (HCC)   Metabolic acidosis   Hyperlipidemia   Essential hypertension   Atherosclerosis    Assessment and Plan: Euvolemic moderate hyponatremia -Patient brought up to ED via EMS as family concern for worsening generalized weakness and confusion for last couple of days. Patient takes Lasix 40 mg at home.  Except Lasix no other medication which can contribute to development of hyponatremia. - CT head unremarkable except right cerebral hemisphere remote infarction. -On presentation to ED patient is hemodynamically stable - CMP showed significantly low sodium 125, and low chloride 97.  Low mag.  Otherwise CMP unremarkable.  Pending serum and urine osmolarity.  Urine sodium 79.  UA showed specific gravity 1.017 which is within normal range. -Hyponatremia likely secondary from poor solute and oral intake as well as con commitment units of Lasix as well. -In the ED patient received LR 500 cc bolus. - Continue NS 75 cc/h for 24 hours for now. Continue to monitor serum sodium level every 6 hours.  Goal to correct sodium level 6-10 in next 24 hours. -Continue neurocheck every 4 hours - Consult nephrology in the a.m.  Hypochloremia - Slightly low chloride level 97.  Hypochloremia likely in the setting of poor solute and salt intake -Continue NS 75 cc/h.  Hypomagnesemia -Low mag 1.6. - Repleted with mag sulfate 2 g IV in the ED. - Checking morning mag level.  Metabolic acidosis -Low bicarb 17 on  presentation.  Unclear cause of metabolic acidosis - Continue to monitor bicarb level.  If bicarb continues to get worse will start oral bicarb tab.  Acute metabolic encephalopathy-improving -Patient initially found lethargic and more confused at home.  His mentation has been improved with 350 mL of IV bolus by EMS.  [place] alert oriented to name only.  Complaining about lower abdominal/suprapubic pain. - CT head ruled out any acute intracranial finding. - Blood glucose normal on presentation - Acute metabolic encephalopathy in the setting of hyponatremia.  Currently treating for hyponatremia.  Hoping that mentation will improve to baseline.   -Continue neurocheck every 4 hours - Obtaining bedside swallow screen before starting oral medication.  If patient fails will do formal speech evaluation. -Continue cardiac monitoring - Continue fall precaution, aspiration precaution and seizure precaution   Chronic bladder outlet obstruction due to BPH New diagnosis of BPH -CT abdomen pelvis showed bladder wall thickening and trabeculation with severe diverticula compatible with chronic bladder outlet obstruction and prostate enlargement.  Patient does not have a history of BPH in the past. --Called urology Dr.Machen regarding the bladder obstruction and BPH.  Recommended try to get an Foley if unsuccessful get Coude foley and start Flomax.  If unsuccessful to place Foley or Coude need to reach out to urology for formal consult and further recommendation. -Ordering bladder scan and getting an Foley to help with relief with the obstruction. - Starting Flomax 0.4 mg daily -Continue to monitor for urine output. If unsuccessful attempt to place Foley or Coude need to page on-call urology for evaluation.  Mild basilar atelectasis -CT chest showed bibasilar atelectasis - O2 sat 96% on room air.  VBG showed slightly low pCO2 32. - Continue continuous pulse ox check and if oxygen saturation drop need to  start nasal cannula oxygen 2 L as needed -Consulted chest PT and incentive spirometry  CKD stage 3B -Creatinine 1.16 on  presentation which is around patient baseline. Baseline GFR in between 35-39. -CT abdomen pelvis did not showed any evidence of hydronephrosis or renal stone. - Continue to monitor renal function. Continue renally dose medication  Chronic atrial fibrillation on warfarin -EKG showed atrial fibrillation rate controlled to 102. - Consulted pharmacy for warfarin resumption. -Continue Lopressor XL 25 mg daily.  Diastolic heart failure preserved EF 50 to 55% Essential hypertension -At home patient is on Lasix 40 mg daily.  Holding Lasix in the setting of hyponatremia.  Blood pressure within good range around 134/100-1 51/94.  Continue hydralazine 5 mg every 8 hour as needed for systolic blood pressure more than 160.  Hyperlipidemia Aortic atherosclerosis -Resumed home Lipitor, is edema and fenofibrate.   DVT prophylaxis:  Coumadin Code Status:  DNR/DNI(Do NOT Intubate).  Patient's wife and son is the healthcare power of attorney.  CODE STATUS verified with family. Diet: Currently n.p.o. Family Communication: Discussed treatment plan with patient's wife Cristina Hensey Disposition Plan: Pending clinical disposition.  Tentative discharge to home in next 3 to 4 days. Consults: Nephrology Admission status:   Inpatient,  progressive unit  Severity of Illness: The appropriate patient status for this patient is INPATIENT. Inpatient status is judged to be reasonable and necessary in order to provide the required intensity of service to ensure the patient's safety. The patient's presenting symptoms, physical exam findings, and initial radiographic and laboratory data in the context of their chronic comorbidities is felt to place them at high risk for further clinical deterioration. Furthermore, it is not anticipated that the patient will be medically stable for discharge from the  hospital within 2 midnights of admission.   * I certify that at the point of admission it is my clinical judgment that the patient will require inpatient hospital care spanning beyond 2 midnights from the point of admission due to high intensity of service, high risk for further deterioration and high frequency of surveillance required.[name]    Tereasa Coop, MD [facility]  How to contact the [facility] Attending or Consulting provider 7A - 7P or covering provider during after hours 7P -7A, for this patient.  Check the care team in [facility] and look for a) attending/consulting [facility] provider listed and b) the [facility] team listed Log into [email or web address] and use [facility] universal password to access. If you do not have the password, please contact the hospital operator. Locate the [facility] provider you are looking for under [facility] and page to a number that you can be directly reached. If you still have difficulty reaching the provider, please page the [facility] (Director on Call) for the Hospitalists listed on amion for assistance.  03/17/2023, 6:37 AM

## 2023-03-17 NOTE — Consult Note (Signed)
Urology Consult Note   Requesting Attending Physician:  Dr. Janalyn Shy Service Providing Consult: Urology  Consulting Attending: Dr. Roxy Horseman   Reason for Consult:  enlarged prostate?  HPI: Justin Gallegos is seen in consultation for reasons noted above at the request of Dr. Janalyn Shy  This is a 89 y.o. male presenting to [facility] via EMS for progressive confusion and generalized weakness x 1 week.  PMH significant for left nephrectomy, EF 55-60%, HTN, A-fib on warfarin, and TIA.  Per the [facility]&P Urology was consulted and informally regarding bladder outlet obstruction and evidence of BPH on CT.  They were to contact us in the morning if unable to place a Foley catheter.  On my arrival today formal consult was present and I am seeing the patient as requested   On my arrival Foley catheter is in place draining clear yellow urine.  Nursing reports no difficulty with placement and that the patient was urinating spontaneously prior to placement.     Past Medical History: Past Medical History:  Diagnosis Date   Arthritis    Atrial fibrillation (HCC)    sees Dr. Dietrich Pates    Cancer [facility])    ureteral, per Dr. Laverle Patter    Eye disease    sees Dr. Stephannie Li    Hemorrhoids    Hyperlipidemia    Hypertension    Low back pain    TIA (transient ischemic attack)     Past Surgical History:  Past Surgical History:  Procedure Laterality Date   APPENDECTOMY     CATARACT EXTRACTION, BILATERAL  2013   per Dr. Dione Booze    COLONOSCOPY  06-28-10   per Dr. Leone Payor, benign polyps and severe diverticulosis, no repeats needed    HERNIA REPAIR     NEPHRECTOMY     TONSILLECTOMY     URETERECTOMY      Medication: Current Facility-Administered Medications  Medication Dose Route Frequency Provider Last Rate Last Admin   0.9 %  sodium chloride infusion  250 mL Intravenous PRN Sundil, Subrina, MD       0.9 %  sodium chloride infusion   Intravenous Continuous Sundil, Subrina, MD 75 mL/hr at  03/17/23 0710 New Bag at 03/17/23 0710   acetaminophen (TYLENOL) tablet 650 mg  650 mg Oral Q6H PRN Janalyn Shy Subrina, MD       Or   acetaminophen (TYLENOL) suppository 650 mg  650 mg Rectal Q6H PRN Janalyn Shy, Subrina, MD       atorvastatin (LIPITOR) tablet 40 mg  40 mg Oral Daily Sundil, Subrina, MD       ezetimibe (ZETIA) tablet 10 mg  10 mg Oral Daily Sundil, Subrina, MD       fenofibrate tablet 54 mg  54 mg Oral Daily Sundil, Subrina, MD       hydrALAZINE (APRESOLINE) injection 5 mg  5 mg Intravenous Q8H PRN Sundil, Subrina, MD       metoprolol succinate (TOPROL-XL) 24 hr tablet 25 mg  25 mg Oral Daily Sundil, Subrina, MD       ondansetron [facility]) tablet 4 mg  4 mg Oral Q6H PRN Janalyn Shy, Subrina, MD       Or   ondansetron [facility]) injection 4 mg  4 mg Intravenous Q6H PRN Sundil, Subrina, MD       sodium chloride flush (NS) 0.9 % injection 3 mL  3 mL Intravenous Q12H Sundil, Subrina, MD       sodium chloride flush (NS) 0.9 % injection 3 mL  3 mL Intravenous PRN Janalyn Shy, Subrina, MD       tamsulosin [facility]) capsule 0.4 mg  0.4 mg Oral Daily Janalyn Shy, Subrina, MD       Warfarin - Pharmacist Dosing Inpatient   Does not apply q1600 Donell Beers, [facility]       Current Outpatient Medications  Medication Sig Dispense Refill   acetaminophen (TYLENOL) 500 MG tablet Take 500 mg by mouth every 6 (six) hours as needed for moderate pain.     Aflibercept (EYLEA IO) Inject into the eye.     atorvastatin (LIPITOR) 40 MG tablet Take 1 tablet by mouth once daily 90 tablet 3   Cholecalciferol (VITAMIN D) 125 MCG (5000 UT) CAPS Take 5,000 Units by mouth daily.      ezetimibe (ZETIA) 10 MG tablet TAKE 1 TABLET BY MOUTH ONCE DAILY PHYSICAL  WITH  LABS  REQUIRED  FOR  GREATER  QUANTITY  OF  REFILLS 30 tablet 0   fenofibrate (TRICOR) 145 MG tablet Take 1 tablet (145 mg total) by mouth daily. 90 tablet 3   metoprolol succinate (TOPROL-XL) 25 MG 24 hr tablet Take 1 tablet by mouth once daily 90 tablet 0   warfarin  (COUMADIN) 2.5 MG tablet TAKE 1 TABLET BY MOUTH ONCE DAILY EXCEPT TAKE 1 AND 1/2 ON SUNDAYS, WEDNESDAYS AND FRIDAYS OR AS DIRECTED BY ANTICOAGULATION CLINIC (Patient taking differently: Take 2.5-3.75 mg by mouth as directed. TAKE 1 TABLET (2.5 MG) BY MOUTH ONCE DAILY EXCEPT TAKE 1 AND 1/2 TABLETS (3.75 MG) ON SUNDAYS AND THURSDAYS AS DIRECTED BY ANTICOAGULATION CLINIC) 120 tablet 1   furosemide (LASIX) 20 MG tablet Take one tablet every day as needed for fluid (Patient not taking: Reported on 03/17/2023) 60 tablet 5   ketoconazole (NIZORAL) 2 % cream APPLY TOPICALLY DAILY (Patient not taking: Reported on 03/17/2023) 30 g 5   tobramycin (TOBREX) 0.3 % ophthalmic solution Place 1 drop into both eyes as directed. INSTILL 1 DROP INTO RIGHT EYE 4 TIMES DAILY; BEGIN 1 DAY PRIOR TO TREATMENT, CONTINUE THE DAY OF TREATMENT AND ONE DAY AFTER TREATMENT.     triamcinolone cream (KENALOG) 0.1 % Apply 1 Application topically 2 (two) times daily. (Patient not taking: Reported on 03/17/2023) 45 g 5   warfarin (COUMADIN) 1 MG tablet TAKE ONE TABLET BY MOUTH DAILY OR AS DIRECTED BY ANTICOAGULATION CLINIC (Patient not taking: Reported on 03/17/2023) 30 tablet 1    Allergies: Allergies  Allergen Reactions   Penicillins     Unable to breathe    Amoxicillin-Pot Clavulanate     REACTION: throat swelling    Social History: Social History   Tobacco Use   Smoking status: Never   Smokeless tobacco: Never  Vaping Use   Vaping status: Never Used  Substance Use Topics   Alcohol use: No    Alcohol/week: 0.0 standard drinks of alcohol   Drug use: No    Family History Family History  Problem Relation Age of Onset   Stroke Father    Coronary artery disease Other        fhx   Stroke Other        fhx    Review of Systems  Unable to perform ROS: Dementia     Objective   Vital signs in last 24 hours: BP (!) 151/94 (BP Location: Left Arm)   Pulse 93   Temp 97.8 F (36.6 C) (Oral)   Resp 18   SpO2 98%    Physical Exam General: Appears stated  age, baseline dementia HEENT: [place]/AT Pulmonary: Normal work of breathing Cardiovascular: RRR, no cyanosis Abdomen: Soft, NTTP, nondistended GU: Foley catheter in place draining clear yellow urine   Most Recent Labs: Lab Results  Component Value Date   WBC 7.7 03/17/2023   HGB 14.5 03/17/2023   HCT 42.3 03/17/2023   PLT 166 03/17/2023    Lab Results  Component Value Date   NA 126 (L) 03/17/2023   K 4.4 03/17/2023   CL 99 03/17/2023   CO2 18 (L) 03/17/2023   BUN 15 03/17/2023   CREATININE 1.35 (H) 03/17/2023   CALCIUM 7.9 (L) 03/17/2023   MG 2.0 03/17/2023   PHOS 2.6 07/29/2010    Lab Results  Component Value Date   INR 3.6 (H) 03/17/2023   APTT 51 (H) 03/25/2011     Urine Culture: @[id number] (laburin,org,[id number])@   IMAGING: CT CHEST ABDOMEN PELVIS WO CONTRAST  Result Date: 03/17/2023 CLINICAL DATA:  Sepsis EXAM: CT CHEST, ABDOMEN AND PELVIS WITHOUT CONTRAST TECHNIQUE: Multidetector CT imaging of the chest, abdomen and pelvis was performed following the standard protocol without IV contrast. RADIATION DOSE REDUCTION: This exam was performed according to the departmental dose-optimization program which includes automated exposure control, adjustment of the mA and/or kV according to patient size and/or use of iterative reconstruction technique. COMPARISON:  11/16/2013 FINDINGS: CT CHEST FINDINGS Cardiovascular: Mild cardiomegaly. Small pericardial effusion. Diffuse coronary artery and aortic atherosclerosis. No aneurysm. Mediastinum/Nodes: No mediastinal, hilar, or axillary adenopathy. Trachea and esophagus are unremarkable. Thyroid unremarkable. Lungs/Pleura: Bibasilar atelectasis. No effusions or pneumothorax. No confluent airspace opacities. Calcified granuloma posteriorly in the left upper lobe. Musculoskeletal: Chest wall soft tissues are unremarkable. No acute bony abnormality. CT ABDOMEN PELVIS FINDINGS Hepatobiliary: No  focal hepatic abnormality. Gallbladder unremarkable. Pancreas: No focal abnormality or ductal dilatation. Spleen: No focal abnormality.  Normal size. Adrenals/Urinary Tract: Prior left nephrectomy. Adrenal glands unremarkable. No right renal stone, hydronephrosis or mass. Bladder wall is thickened and irregular with diverticula, likely related to chronic bladder outlet obstruction. Stomach/Bowel: Diffuse colonic diverticulosis, most pronounced in the left colon. No diverticulitis. Duodenal diverticula noted in the descending portion of the duodenum. Stomach unremarkable. No bowel obstruction. Vascular/Lymphatic: Aortic atherosclerosis. No evidence of aneurysm or adenopathy. Reproductive: Prostate enlargement Other: No free fluid or free air. Musculoskeletal: No acute bony abnormality. IMPRESSION: Cardiomegaly, coronary artery disease.  Aortic atherosclerosis. Bibasilar atelectasis. Colonic diverticulosis.  No active diverticulitis. Bladder wall thickening and trabeculation with several diverticula compatible with chronic bladder outlet obstruction. Prostate enlargement. No acute findings. Electronically Signed   By: Charlett Nose M.D.   On: 03/17/2023 00:44   CT Head Wo Contrast  Result Date: 03/17/2023 CLINICAL DATA:  Generalized weakness, altered mental status EXAM: CT HEAD WITHOUT CONTRAST TECHNIQUE: Contiguous axial images were obtained from the base of the skull through the vertex without intravenous contrast. RADIATION DOSE REDUCTION: This exam was performed according to the departmental dose-optimization program which includes automated exposure control, adjustment of the mA and/or kV according to patient size and/or use of iterative reconstruction technique. COMPARISON:  CT head 07/28/2010 CT abdomen pelvis 11/16/2013 FINDINGS: CT HEAD FINDINGS Brain: Progressive parenchymal volume loss is commensurate with the patient's age. No acute intracranial hemorrhage or infarct. No abnormal mass effect or midline  shift. No abnormal intra or extra-axial mass lesion or fluid collection. Ventricular size is prominent, but commensurate with the degree of parenchymal atrophy. Remote infarct within the right cerebellar hemisphere noted, new since prior examination. Vascular: No asymmetric hyperdense vasculature at the skull base. Skull:  Normal. Negative for fracture or focal lesion. Sinuses/Orbits: No acute finding. Other: Mastoid air cells and middle ear cavities are are clear. IMPRESSION: 1. No acute intracranial hemorrhage or infarct. 2. Progressive parenchymal volume loss, commensurate with the patient's age. 3. Remote right cerebellar hemisphere infarct, new since prior examination. Electronically Signed   By: Helyn Numbers M.D.   On: 03/17/2023 00:18    ------  Assessment:  89 y.o. male with dementia   Recommendations: #BPH Continue Flomax CT A/P shows bladder wall thickening and trabeculation. There are some diverticuli of the bladder, both of which are signs of chronic outlet obstruction.  His prostate is enlarged, roughly 80g based on CT measurements.  At his advanced age, on blood thinners, and with dementia, I would not recommend his catheter staying in place.  He has a high probability of traumatically removing it himself.  If he was in fact urinating on his own as reported, he would benefit from a purewick catheter over an indwelling Foley.  His BPH can be addressed, if necessary, on an outpatient basis. Please have his caretaker contact [facility] to schedule as he approaches discharge.  Urology will not need to follow and sign off at this time.  Case and plan discussed with Dr. Romana Juniper, NP Pager: [phone]   Please contact the urology consult pager with any further questions/concerns.

## 2023-03-17 NOTE — Plan of Care (Signed)

## 2023-03-17 NOTE — ED Notes (Signed)
Speech therapy at bedside.

## 2023-03-17 NOTE — Evaluation (Signed)
Clinical/Bedside Swallow Evaluation Patient Details  Name: Justin Gallegos MRN: [id number] Date of Birth: [birth year]  Today's Date: 03/17/2023 Time: SLP Start Time (ACUTE ONLY): 1330 SLP Stop Time (ACUTE ONLY): 1345 SLP Time Calculation (min) (ACUTE ONLY): 15 min  Past Medical History:  Past Medical History:  Diagnosis Date   Arthritis    Atrial fibrillation (HCC)    sees Dr. Dietrich Pates    Cancer [facility])    ureteral, per Dr. Laverle Patter    Eye disease    sees Dr. Stephannie Li    Hemorrhoids    Hyperlipidemia    Hypertension    Low back pain    TIA (transient ischemic attack)    Past Surgical History:  Past Surgical History:  Procedure Laterality Date   APPENDECTOMY     CATARACT EXTRACTION, BILATERAL  2013   per Dr. Dione Booze    COLONOSCOPY  06-28-10   per Dr. Leone Payor, benign polyps and severe diverticulosis, no repeats needed    HERNIA REPAIR     NEPHRECTOMY     TONSILLECTOMY     URETERECTOMY     HPI:  Patient is a 89 y.o. male with PMH: HTN, HLD, chronic diastolic CHF, permanent a-fib, h/o TIA, chronic low back pain. He presented to the hospital on 03/17/2023 via EMS from home with progressive weakness, confusion which had been ongoing for past week. When EMS arrived, patient was in a-fib with intermittent tachycardi. In ED, he was afebrile, SpO2 97% on RA. CT head did not show any acute intracranial process. He was admitted with acute metabolic encephalopathy, hyponatremia with etiology likely secondary to poor oral intake, electrolyte imbalance.    Assessment / Plan / Recommendation  Clinical Impression  Patient is presenting with clinical s/s of what appears to be an oral phase dysphagia as per this bedside swallow evaluation. SLP assessed his toleration of PO's via: soft solids (soft bread deli meat sandwich), puree (applesauce) an thin liquids (water). Swallow initiation appeared timely and no overt s/s aspiration. During oral phase, patient with slow but adequate mastication of  solids and with full clearance of oral cavity. He did report that his teeth were "soft". He was able to feed self but benefited from SLP setup assistance. (largely due to monitoring lines/leads getting in the way) SLP will f/u at least one more time to ensure toleration and determine if able to upgrade to regular texture solids. SLP Visit Diagnosis: Dysphagia, unspecified (R13.10)    Aspiration Risk  No limitations    Diet Recommendation Dysphagia 3 (Mech soft);Thin liquid    Liquid Administration via: Straw;Cup Medication Administration: Whole meds with liquid Supervision: Patient able to self feed;Intermittent supervision to cue for compensatory strategies Compensations: Slow rate;Small sips/bites Postural Changes: Seated upright at 90 degrees    Other  Recommendations Oral Care Recommendations: Oral care BID    Recommendations for follow up therapy are one component of a multi-disciplinary discharge planning process, led by the attending physician.  Recommendations may be updated based on patient status, additional functional criteria and insurance authorization.  Follow up Recommendations No SLP follow up      Assistance Recommended at Discharge    Functional Status Assessment Patient has had a recent decline in their functional status and demonstrates the ability to make significant improvements in function in a reasonable and predictable amount of time.  Frequency and Duration min 1 x/week  1 week       Prognosis Prognosis for improved oropharyngeal function: Good  Swallow Study   General Date of Onset: 03/17/23 HPI: Patient is a 89 y.o. male with PMH: HTN, HLD, chronic diastolic CHF, permanent a-fib, h/o TIA, chronic low back pain. He presented to the hospital on 03/17/2023 via EMS from home with progressive weakness, confusion which had been ongoing for past week. When EMS arrived, patient was in a-fib with intermittent tachycardi. In ED, he was afebrile, SpO2 97% on RA.  CT head did not show any acute intracranial process. He was admitted with acute metabolic encephalopathy, hyponatremia with etiology likely secondary to poor oral intake, electrolyte imbalance. Type of Study: Bedside Swallow Evaluation Previous Swallow Assessment: none found Diet Prior to this Study: NPO Temperature Spikes Noted: No Respiratory Status: Room air History of Recent Intubation: No Behavior/Cognition: Alert;Cooperative;Pleasant mood Oral Cavity Assessment: Within Functional Limits Oral Care Completed by SLP: No Oral Cavity - Dentition: Adequate natural dentition Vision: Functional for self-feeding Self-Feeding Abilities: Able to feed self;Needs set up Patient Positioning: Upright in bed Baseline Vocal Quality: Normal Volitional Cough: Cognitively unable to elicit Volitional Swallow: Unable to elicit    Oral/Motor/Sensory Function Overall Oral Motor/Sensory Function: Within functional limits   Ice Chips     Thin Liquid Thin Liquid: Within functional limits Presentation: Self Fed;Straw    Nectar Thick     Honey Thick     Puree Puree: Within functional limits Presentation: Self Fed;Spoon   Solid     Solid: Impaired Oral Phase Impairments: Impaired mastication Other Comments: Patient with slow but complete mastication and oral clearance. He reports "My teeth are soft"     Angela Nevin, MA, CCC-SLP Speech Therapy

## 2023-03-17 NOTE — ED Notes (Signed)
Patient found trying to get off bed, monitor leads detached, confused. Noted a foley catheter in place and draining. Pt was assisted by this RN and tech back into bed. Patient changed, bed alarm activated. Call light in reach. Room door open for closer observation

## 2023-03-17 NOTE — ED Notes (Signed)
ED TO INPATIENT HANDOFF REPORT  Name/Age/Gender Justin Gallegos 89 y.o. male  Code Status    Code Status Orders  (From admission, onward)           Start     Ordered   03/17/23 0540  Do not attempt resuscitation (DNR)  Continuous       Question Answer Comment  If patient has no pulse and is not breathing Do Not Attempt Resuscitation   If patient has a pulse and/or is breathing: Medical Treatment Goals LIMITED ADDITIONAL INTERVENTIONS: Use medication/IV fluids and cardiac monitoring as indicated; Do not use intubation or mechanical ventilation (DNI), also provide comfort medications.  Transfer to Progressive/Stepdown as indicated, avoid Intensive Care.   Consent: Discussion documented in EHR or advanced directives reviewed      03/17/23 0541           Code Status History     This patient has a current code status but no historical code status.       Home/SNF/Other Home  Chief Complaint Hyponatremia [E87.1]  Level of Care/Admitting Diagnosis ED Disposition     ED Disposition  Admit   Condition  --   Comment  Hospital Area: [facility] [[id number]]  Level of Care: Progressive [102]  Admit to Progressive based on following criteria: NEUROLOGICAL AND NEUROSURGICAL complex patients with significant risk of instability, who do not meet ICU criteria, yet require close observation or frequent assessment (< / = every 2 - 4 hours) with medical / nursing intervention.  Admit to Progressive based on following criteria: MULTISYSTEM THREATS such as stable sepsis, metabolic/electrolyte imbalance with or without encephalopathy that is responding to early treatment.  May admit patient to [facility] or [facility] if equivalent level of care is available:: No  Covid Evaluation: Asymptomatic - no recent exposure (last 10 days) testing not required  Diagnosis: Hyponatremia [198519]  Admitting Physician: Tereasa Coop [[id number]]  Attending Physician: Tereasa Coop [[id number]]  Certification:: I certify this patient will need inpatient services for at least 2 midnights  Estimated Length of Stay: 5          Medical History Past Medical History:  Diagnosis Date   Arthritis    Atrial fibrillation [facility])    sees Dr. Dietrich Pates    Cancer [facility])    ureteral, per Dr. Laverle Patter    Eye disease    sees Dr. Stephannie Li    Hemorrhoids    Hyperlipidemia    Hypertension    Low back pain    TIA (transient ischemic attack)     Allergies Allergies  Allergen Reactions   Penicillins     Unable to breathe    Amoxicillin-Pot Clavulanate     REACTION: throat swelling    IV Location/Drains/Wounds Patient Lines/Drains/Airways Status     Active Line/Drains/Airways     Name Placement date Placement time Site Days   Peripheral IV 03/17/23 20 G Left Antecubital 03/17/23  0030  Antecubital  less than 1   Peripheral IV 03/17/23 20 G 1" Left;Posterior Forearm 03/17/23  0031  Forearm  less than 1            Labs/Imaging Results for orders placed or performed during the hospital encounter of 03/16/23 (from the past 48 hour(s))  Comprehensive metabolic panel     Status: Abnormal   Collection Time: 03/17/23 12:15 AM  Result Value Ref Range   Sodium 125 (L) 135 - 145 mmol/L   Potassium 4.2 3.5 -  5.1 mmol/L   Chloride 97 (L) 98 - 111 mmol/L   CO2 17 (L) 22 - 32 mmol/L   Glucose, Bld 114 (H) 70 - 99 mg/dL    Comment: Glucose reference range applies only to samples taken after fasting for at least 8 hours.   BUN 15 8 - 23 mg/dL   Creatinine, Ser 4.01 (H) 0.61 - 1.24 mg/dL   Calcium 8.3 (L) 8.9 - 10.3 mg/dL   Total Protein 5.7 (L) 6.5 - 8.1 g/dL   Albumin 3.0 (L) 3.5 - 5.0 g/dL   AST 27 15 - 41 U/L   ALT 20 0 - 44 U/L   Alkaline Phosphatase 44 38 - 126 U/L   Total Bilirubin 1.0 0.3 - 1.2 mg/dL   GFR, Estimated 44 (L) >60 mL/min    Comment: (NOTE) Calculated using the CKD-EPI Creatinine Equation (2021)    Anion gap 11 5 - 15    Comment:  Performed at [facility], [address], [place], [place] [address]  CBC with Differential     Status: None   Collection Time: 03/17/23 12:15 AM  Result Value Ref Range   WBC 9.8 4.0 - 10.5 K/uL   RBC 4.31 4.22 - 5.81 MIL/uL   Hemoglobin 14.1 13.0 - 17.0 g/dL   HCT 36.6 44.0 - 34.7 %   MCV 93.5 80.0 - 100.0 fL   MCH 32.7 26.0 - 34.0 pg   MCHC 35.0 30.0 - 36.0 g/dL   RDW 42.5 95.6 - 38.7 %   Platelets 194 150 - 400 K/uL   nRBC 0.0 0.0 - 0.2 %   Neutrophils Relative % 76 %   Neutro Abs 7.4 1.7 - 7.7 K/uL   Lymphocytes Relative 15 %   Lymphs Abs 1.5 0.7 - 4.0 K/uL   Monocytes Relative 9 %   Monocytes Absolute 0.9 0.1 - 1.0 K/uL   Eosinophils Relative 0 %   Eosinophils Absolute 0.0 0.0 - 0.5 K/uL   Basophils Relative 0 %   Basophils Absolute 0.0 0.0 - 0.1 K/uL   Immature Granulocytes 0 %   Abs Immature Granulocytes 0.03 0.00 - 0.07 K/uL    Comment: Performed at [facility], [address] [place], [place] [address]  Protime-INR     Status: Abnormal   Collection Time: 03/17/23 12:15 AM  Result Value Ref Range   Prothrombin Time 36.2 (H) 11.4 - 15.2 seconds   INR 3.6 (H) 0.8 - 1.2    Comment: (NOTE) INR goal varies based on device and disease states. Performed at [facility], [address] [place], [place] [address]   Troponin I (High Sensitivity)     Status: None   Collection Time: 03/17/23 12:15 AM  Result Value Ref Range   Troponin I (High Sensitivity) 13 <18 ng/L    Comment: (NOTE) Elevated high sensitivity troponin I (hsTnI) values and significant  changes across serial measurements may suggest ACS but many other  chronic and acute conditions are known to elevate hsTnI results.  Refer to the "Links" section for chest pain algorithms and additional  guidance. Performed at [facility], [address], [place], [place] [address]   Magnesium     Status: Abnormal   Collection Time:  03/17/23 12:15 AM  Result Value Ref Range   Magnesium 1.6 (L) 1.7 - 2.4 mg/dL    Comment: Performed at [facility], [address] [place], [place] [address]  I-Stat Lactic Acid,  ED     Status: None   Collection Time: 03/17/23 12:24 AM  Result Value Ref Range   Lactic Acid, Venous 1.8 0.5 - 1.9 mmol/L  I-stat chem 8, ED     Status: Abnormal   Collection Time: 03/17/23 12:24 AM  Result Value Ref Range   Sodium 128 (L) 135 - 145 mmol/L   Potassium 4.3 3.5 - 5.1 mmol/L   Chloride 97 (L) 98 - 111 mmol/L   BUN 14 8 - 23 mg/dL   Creatinine, Ser 0.86 (H) 0.61 - 1.24 mg/dL   Glucose, Bld 578 (H) 70 - 99 mg/dL    Comment: Glucose reference range applies only to samples taken after fasting for at least 8 hours.   Calcium, Ion 1.10 (L) 1.15 - 1.40 mmol/L   TCO2 19 (L) 22 - 32 mmol/L   Hemoglobin 13.6 13.0 - 17.0 g/dL   HCT 46.9 62.9 - 52.8 %  Blood gas, venous ([facility], [facility], [facility])     Status: Abnormal   Collection Time: 03/17/23 12:25 AM  Result Value Ref Range   pH, Ven 7.25 7.25 - 7.43   pCO2, Ven <18 (LL) 44 - 60 mmHg    Comment: CRITICAL RESULT CALLED TO, READ BACK BY AND VERIFIED WITH: ELLWANGER,A AT 0055 ON 03/17/23 BY LUZOLOP    pO2, Ven <31 (LL) 32 - 45 mmHg    Comment: CRITICAL RESULT CALLED TO, READ BACK BY AND VERIFIED WITH: ELLWANGER,A AT 4132 ON 03/17/23 BY LUZOLOP    Bicarbonate 6.1 (L) 20.0 - 28.0 mmol/L   Acid-base deficit 18.4 (H) 0.0 - 2.0 mmol/L   O2 Saturation NOT CALCULATED %   Patient temperature 37.0     Comment: Performed at [facility], [address] [place], [place] [address]  Urinalysis, w/ Reflex to Culture (Infection Suspected) -Urine, Clean Catch     Status: None   Collection Time: 03/17/23  2:11 AM  Result Value Ref Range   Specimen Source URINE, CLEAN CATCH    Color, Urine YELLOW YELLOW   APPearance CLEAR CLEAR   Specific Gravity, Urine 1.017 1.005 - 1.030   pH 6.0 5.0 - 8.0   Glucose, UA NEGATIVE NEGATIVE mg/dL    Hgb urine dipstick NEGATIVE NEGATIVE   Bilirubin Urine NEGATIVE NEGATIVE   Ketones, ur NEGATIVE NEGATIVE mg/dL   Protein, ur NEGATIVE NEGATIVE mg/dL   Nitrite NEGATIVE NEGATIVE   Leukocytes,Ua NEGATIVE NEGATIVE   RBC / HPF 0-5 0 - 5 RBC/hpf   WBC, UA 0-5 0 - 5 WBC/hpf    Comment:        Reflex urine culture not performed if WBC <=10, OR if Squamous epithelial cells >5. If Squamous epithelial cells >5 suggest recollection.    Bacteria, UA NONE SEEN NONE SEEN   Squamous Epithelial / HPF 0-5 0 - 5 /HPF   Mucus PRESENT     Comment: Performed at [facility], [address] [place], [place] [address]  Na and K (sodium & potassium), rand urine     Status: None   Collection Time: 03/17/23  2:55 AM  Result Value Ref Range   Sodium, Ur 79 mmol/L   Potassium Urine 82 mmol/L    Comment: Performed at [facility], [address], [place], [place] [address]  Osmolality, urine     Status: None   Collection Time: 03/17/23  4:25 AM  Result Value Ref Range   Osmolality, Ur 535 300 - 900 mOsm/kg  Comment: PERFORMED AT [facility] Performed at [facility], [address], [place], [place] [address]   Troponin I (High Sensitivity)     Status: None   Collection Time: 03/17/23  6:05 AM  Result Value Ref Range   Troponin I (High Sensitivity) 15 <18 ng/L    Comment: (NOTE) Elevated high sensitivity troponin I (hsTnI) values and significant  changes across serial measurements may suggest ACS but many other  chronic and acute conditions are known to elevate hsTnI results.  Refer to the "Links" section for chest pain algorithms and additional  guidance. Performed at [facility], [address], [place], [place] [address]   CBC     Status: None   Collection Time: 03/17/23  6:05 AM  Result Value Ref Range   WBC 7.7 4.0 - 10.5 K/uL   RBC 4.49 4.22 - 5.81 MIL/uL   Hemoglobin 14.5 13.0 - 17.0 g/dL   HCT 09.8 11.9 - 14.7 %    MCV 94.2 80.0 - 100.0 fL   MCH 32.3 26.0 - 34.0 pg   MCHC 34.3 30.0 - 36.0 g/dL   RDW 82.9 56.2 - 13.0 %   Platelets 166 150 - 400 K/uL   nRBC 0.0 0.0 - 0.2 %    Comment: Performed at [facility], [address], [place], [place] [address]  Basic metabolic panel     Status: Abnormal   Collection Time: 03/17/23  6:05 AM  Result Value Ref Range   Sodium 126 (L) 135 - 145 mmol/L   Potassium 4.4 3.5 - 5.1 mmol/L    Comment: HEMOLYSIS AT THIS LEVEL MAY AFFECT RESULT   Chloride 99 98 - 111 mmol/L   CO2 18 (L) 22 - 32 mmol/L   Glucose, Bld 103 (H) 70 - 99 mg/dL    Comment: Glucose reference range applies only to samples taken after fasting for at least 8 hours.   BUN 15 8 - 23 mg/dL   Creatinine, Ser 4.69 (H) 0.61 - 1.24 mg/dL   Calcium 7.9 (L) 8.9 - 10.3 mg/dL   GFR, Estimated 49 (L) >60 mL/min    Comment: (NOTE) Calculated using the CKD-EPI Creatinine Equation (2021)    Anion gap 9 5 - 15    Comment: Performed at [facility], [address], [place], [place] [address]  Magnesium     Status: None   Collection Time: 03/17/23  6:05 AM  Result Value Ref Range   Magnesium 2.0 1.7 - 2.4 mg/dL    Comment: Performed at [facility], [address], [place], [place] [address]   CT CHEST ABDOMEN PELVIS WO CONTRAST  Result Date: 03/17/2023 CLINICAL DATA:  Sepsis EXAM: CT CHEST, ABDOMEN AND PELVIS WITHOUT CONTRAST TECHNIQUE: Multidetector CT imaging of the chest, abdomen and pelvis was performed following the standard protocol without IV contrast. RADIATION DOSE REDUCTION: This exam was performed according to the departmental dose-optimization program which includes automated exposure control, adjustment of the mA and/or kV according to patient size and/or use of iterative reconstruction technique. COMPARISON:  11/16/2013 FINDINGS: CT CHEST FINDINGS Cardiovascular: Mild cardiomegaly. Small pericardial effusion. Diffuse coronary artery and  aortic atherosclerosis. No aneurysm. Mediastinum/Nodes: No mediastinal, hilar, or axillary adenopathy. Trachea and esophagus are unremarkable. Thyroid unremarkable. Lungs/Pleura: Bibasilar atelectasis. No effusions or pneumothorax. No confluent airspace opacities. Calcified granuloma posteriorly in the left upper lobe. Musculoskeletal: Chest wall soft tissues are unremarkable. No acute bony abnormality. CT ABDOMEN PELVIS FINDINGS Hepatobiliary:  No focal hepatic abnormality. Gallbladder unremarkable. Pancreas: No focal abnormality or ductal dilatation. Spleen: No focal abnormality.  Normal size. Adrenals/Urinary Tract: Prior left nephrectomy. Adrenal glands unremarkable. No right renal stone, hydronephrosis or mass. Bladder wall is thickened and irregular with diverticula, likely related to chronic bladder outlet obstruction. Stomach/Bowel: Diffuse colonic diverticulosis, most pronounced in the left colon. No diverticulitis. Duodenal diverticula noted in the descending portion of the duodenum. Stomach unremarkable. No bowel obstruction. Vascular/Lymphatic: Aortic atherosclerosis. No evidence of aneurysm or adenopathy. Reproductive: Prostate enlargement Other: No free fluid or free air. Musculoskeletal: No acute bony abnormality. IMPRESSION: Cardiomegaly, coronary artery disease.  Aortic atherosclerosis. Bibasilar atelectasis. Colonic diverticulosis.  No active diverticulitis. Bladder wall thickening and trabeculation with several diverticula compatible with chronic bladder outlet obstruction. Prostate enlargement. No acute findings. Electronically Signed   By: Charlett Nose M.D.   On: 03/17/2023 00:44   CT Head Wo Contrast  Result Date: 03/17/2023 CLINICAL DATA:  Generalized weakness, altered mental status EXAM: CT HEAD WITHOUT CONTRAST TECHNIQUE: Contiguous axial images were obtained from the base of the skull through the vertex without intravenous contrast. RADIATION DOSE REDUCTION: This exam was performed  according to the departmental dose-optimization program which includes automated exposure control, adjustment of the mA and/or kV according to patient size and/or use of iterative reconstruction technique. COMPARISON:  CT head 07/28/2010 CT abdomen pelvis 11/16/2013 FINDINGS: CT HEAD FINDINGS Brain: Progressive parenchymal volume loss is commensurate with the patient's age. No acute intracranial hemorrhage or infarct. No abnormal mass effect or midline shift. No abnormal intra or extra-axial mass lesion or fluid collection. Ventricular size is prominent, but commensurate with the degree of parenchymal atrophy. Remote infarct within the right cerebellar hemisphere noted, new since prior examination. Vascular: No asymmetric hyperdense vasculature at the skull base. Skull: Normal. Negative for fracture or focal lesion. Sinuses/Orbits: No acute finding. Other: Mastoid air cells and middle ear cavities are are clear. IMPRESSION: 1. No acute intracranial hemorrhage or infarct. 2. Progressive parenchymal volume loss, commensurate with the patient's age. 3. Remote right cerebellar hemisphere infarct, new since prior examination. Electronically Signed   By: Helyn Numbers M.D.   On: 03/17/2023 00:18    Pending Labs Unresulted Labs (From admission, onward)     Start     Ordered   03/18/23 0500  CBC  Tomorrow morning,   R        03/17/23 0546   03/18/23 0500  Protime-INR  Daily,   R      03/17/23 0545   03/18/23 0500  Calcium, ionized  Tomorrow morning,   R        03/17/23 1218   03/18/23 0500  Comprehensive metabolic panel  Tomorrow morning,   R        03/17/23 1218   03/18/23 0500  Magnesium  Tomorrow morning,   R        03/17/23 1218   03/18/23 0500  Phosphorus  Tomorrow morning,   R        03/17/23 1218   03/17/23 0500  Basic metabolic panel  Every 6 hours,   R (with STAT occurrences)      03/17/23 0407   03/17/23 0407  Osmolality  Add-on,   AD        03/17/23 0406   03/16/23 2333  Blood Culture  (routine x 2)  (Undifferentiated presentation (screening labs and basic nursing orders))  BLOOD CULTURE X 2,   STAT      03/16/23 2333  Vitals/Pain Today's Vitals   03/17/23 0800 03/17/23 0821 03/17/23 1101 03/17/23 1224  BP: (!) 156/94  (!) 146/97 (!) 157/108  Pulse: 94  96 92  Resp: 19  (!) 22 (!) 22  Temp:  98 F (36.7 C)  97.7 F (36.5 C)  TempSrc:  Oral  Oral  SpO2: 97%  97% 99%  PainSc:        Isolation Precautions No active isolations  Medications Medications  atorvastatin (LIPITOR) tablet 40 mg (40 mg Oral Given 03/17/23 1325)  ezetimibe (ZETIA) tablet 10 mg (10 mg Oral Given 03/17/23 1326)  fenofibrate tablet 54 mg (54 mg Oral Given 03/17/23 1325)  metoprolol succinate (TOPROL-XL) 24 hr tablet 25 mg (25 mg Oral Given 03/17/23 1325)  sodium chloride flush (NS) 0.9 % injection 3 mL (3 mLs Intravenous Given 03/17/23 1338)  sodium chloride flush (NS) 0.9 % injection 3 mL (3 mLs Intravenous Given 03/17/23 1338)  0.9 %  sodium chloride infusion (has no administration in time range)  acetaminophen (TYLENOL) tablet 650 mg (has no administration in time range)    Or  acetaminophen (TYLENOL) suppository 650 mg (has no administration in time range)  ondansetron (ZOFRAN) tablet 4 mg (has no administration in time range)    Or  ondansetron (ZOFRAN) injection 4 mg (has no administration in time range)  hydrALAZINE (APRESOLINE) injection 5 mg (has no administration in time range)  Warfarin - Pharmacist Dosing Inpatient (0 each Does not apply Hold 03/17/23 1600)  tamsulosin (FLOMAX) capsule 0.4 mg (0.4 mg Oral Given 03/17/23 0800)  calcium gluconate 1 g/ 50 mL sodium chloride IVPB (1,000 mg Intravenous New Bag/Given 03/17/23 1333)  0.9 %  sodium chloride infusion ( Intravenous New Bag/Given 03/17/23 1333)  lactated ringers bolus 500 mL (0 mLs Intravenous Stopped 03/17/23 0143)  magnesium sulfate IVPB 2 g 50 mL (0 g Intravenous Stopped 03/17/23 0325)    Mobility walks with person assist

## 2023-03-17 NOTE — ED Notes (Signed)
Received reports from night shift RN. Informed patient will need a foley.

## 2023-03-17 NOTE — Progress Notes (Signed)
ANTICOAGULATION CONSULT NOTE - Initial Consult  Pharmacy Consult for Warfarin Indication: atrial fibrillation  Allergies  Allergen Reactions   Penicillins     Unable to breathe    Amoxicillin-Pot Clavulanate     REACTION: throat swelling    Patient Measurements:      Vital Signs: Temp: 97.8 F (36.6 C) (08/05 0437) Temp Source: Oral (08/05 0437) BP: 151/94 (08/05 0437) Pulse Rate: 93 (08/05 0437)  Labs: Recent Labs    03/17/23 0015 03/17/23 0024  HGB 14.1 13.6  HCT 40.3 40.0  PLT 194  --   LABPROT 36.2*  --   INR 3.6*  --   CREATININE 1.48* 1.60*  TROPONINIHS 13  --     CrCl cannot be calculated (Unknown ideal weight.).   Medical History: Past Medical History:  Diagnosis Date   Arthritis    Atrial fibrillation [facility])    sees Dr. Dietrich Pates    Cancer [facility])    ureteral, per Dr. Alberteen Sam disease    sees Dr. Stephannie Li    Hemorrhoids    Hyperlipidemia    Hypertension    Low back pain    TIA (transient ischemic attack)     Medications:  PTA Warfarin 2.5mg  daily except 3.75mg  Sun/Thur  Assessment:  89 yr male with generalized weakness and progressive confusion this past week.  Noted hyponatremia and hypomagnesemia. PMH significant for HTN, AFib (on warfarin), colon cancer, TIA Patient reports LD of warfarin = 03/16/23 INR on admission = 3.6 (supratherapeutic)  Goal of Therapy:  INR 2-3 Monitor platelets by anticoagulation protocol: Yes   Plan:  No warfarin today (INR = 3.6) Daily PT/INR  , Joselyn Glassman, PharmD 03/17/2023,5:47 AM

## 2023-03-17 NOTE — Telephone Encounter (Signed)
Reviewing of pt's chart revealed pt was admitted for weakness yesterday. Pt was unable to stand. ER diagnosed pt with hyponatremia and hypomagnesemia and intermittent tachycardia secondary to Afib. Pt was admitted and remains hospitalized.   Cancelled coumadin clinic apt for this afternoon. Will f/u with pt and family when pt is d/c.

## 2023-03-17 NOTE — ED Notes (Signed)
ED TO INPATIENT HANDOFF REPORT  Name/Age/Gender Justin Gallegos 89 y.o. male  Code Status    Code Status Orders  (From admission, onward)           Start     Ordered   03/17/23 0540  Do not attempt resuscitation (DNR)  Continuous       Question Answer Comment  If patient has no pulse and is not breathing Do Not Attempt Resuscitation   If patient has a pulse and/or is breathing: Medical Treatment Goals LIMITED ADDITIONAL INTERVENTIONS: Use medication/IV fluids and cardiac monitoring as indicated; Do not use intubation or mechanical ventilation (DNI), also provide comfort medications.  Transfer to Progressive/Stepdown as indicated, avoid Intensive Care.   Consent: Discussion documented in EHR or advanced directives reviewed      03/17/23 0541           Code Status History     This patient has a current code status but no historical code status.       Home/SNF/Other Home  Chief Complaint Hyponatremia [E87.1]  Level of Care/Admitting Diagnosis ED Disposition     ED Disposition  Admit   Condition  --   Comment  Hospital Area: [facility] [[id number]]  Level of Care: Progressive [102]  Admit to Progressive based on following criteria: NEUROLOGICAL AND NEUROSURGICAL complex patients with significant risk of instability, who do not meet ICU criteria, yet require close observation or frequent assessment (< / = every 2 - 4 hours) with medical / nursing intervention.  Admit to Progressive based on following criteria: MULTISYSTEM THREATS such as stable sepsis, metabolic/electrolyte imbalance with or without encephalopathy that is responding to early treatment.  May admit patient to [facility] or [facility] if equivalent level of care is available:: No  Covid Evaluation: Asymptomatic - no recent exposure (last 10 days) testing not required  Diagnosis: Hyponatremia [198519]  Admitting Physician: Tereasa Coop [[id number]]  Attending Physician: Tereasa Coop [[id number]]  Certification:: I certify this patient will need inpatient services for at least 2 midnights  Estimated Length of Stay: 5          Medical History Past Medical History:  Diagnosis Date   Arthritis    Atrial fibrillation [facility])    sees Dr. Dietrich Pates    Cancer [facility])    ureteral, per Dr. Laverle Patter    Eye disease    sees Dr. Stephannie Li    Hemorrhoids    Hyperlipidemia    Hypertension    Low back pain    TIA (transient ischemic attack)     Allergies Allergies  Allergen Reactions   Penicillins     Unable to breathe    Amoxicillin-Pot Clavulanate     REACTION: throat swelling    IV Location/Drains/Wounds Patient Lines/Drains/Airways Status     Active Line/Drains/Airways     Name Placement date Placement time Site Days   Peripheral IV 03/17/23 20 G Left Antecubital 03/17/23  0030  Antecubital  less than 1   Peripheral IV 03/17/23 20 G 1" Left;Posterior Forearm 03/17/23  0031  Forearm  less than 1            Labs/Imaging Results for orders placed or performed during the hospital encounter of 03/16/23 (from the past 48 hour(s))  Comprehensive metabolic panel     Status: Abnormal   Collection Time: 03/17/23 12:15 AM  Result Value Ref Range   Sodium 125 (L) 135 - 145 mmol/L   Potassium 4.2 3.5 -  5.1 mmol/L   Chloride 97 (L) 98 - 111 mmol/L   CO2 17 (L) 22 - 32 mmol/L   Glucose, Bld 114 (H) 70 - 99 mg/dL    Comment: Glucose reference range applies only to samples taken after fasting for at least 8 hours.   BUN 15 8 - 23 mg/dL   Creatinine, Ser 4.09 (H) 0.61 - 1.24 mg/dL   Calcium 8.3 (L) 8.9 - 10.3 mg/dL   Total Protein 5.7 (L) 6.5 - 8.1 g/dL   Albumin 3.0 (L) 3.5 - 5.0 g/dL   AST 27 15 - 41 U/L   ALT 20 0 - 44 U/L   Alkaline Phosphatase 44 38 - 126 U/L   Total Bilirubin 1.0 0.3 - 1.2 mg/dL   GFR, Estimated 44 (L) >60 mL/min    Comment: (NOTE) Calculated using the CKD-EPI Creatinine Equation (2021)    Anion gap 11 5 - 15    Comment:  Performed at [facility], [address], [place], [place] [address]  CBC with Differential     Status: None   Collection Time: 03/17/23 12:15 AM  Result Value Ref Range   WBC 9.8 4.0 - 10.5 K/uL   RBC 4.31 4.22 - 5.81 MIL/uL   Hemoglobin 14.1 13.0 - 17.0 g/dL   HCT 47.8 29.5 - 62.1 %   MCV 93.5 80.0 - 100.0 fL   MCH 32.7 26.0 - 34.0 pg   MCHC 35.0 30.0 - 36.0 g/dL   RDW 30.8 65.7 - 84.6 %   Platelets 194 150 - 400 K/uL   nRBC 0.0 0.0 - 0.2 %   Neutrophils Relative % 76 %   Neutro Abs 7.4 1.7 - 7.7 K/uL   Lymphocytes Relative 15 %   Lymphs Abs 1.5 0.7 - 4.0 K/uL   Monocytes Relative 9 %   Monocytes Absolute 0.9 0.1 - 1.0 K/uL   Eosinophils Relative 0 %   Eosinophils Absolute 0.0 0.0 - 0.5 K/uL   Basophils Relative 0 %   Basophils Absolute 0.0 0.0 - 0.1 K/uL   Immature Granulocytes 0 %   Abs Immature Granulocytes 0.03 0.00 - 0.07 K/uL    Comment: Performed at [facility], [address] [place], [place] [address]  Protime-INR     Status: Abnormal   Collection Time: 03/17/23 12:15 AM  Result Value Ref Range   Prothrombin Time 36.2 (H) 11.4 - 15.2 seconds   INR 3.6 (H) 0.8 - 1.2    Comment: (NOTE) INR goal varies based on device and disease states. Performed at [facility], [address] [place], [place] [address]   Troponin I (High Sensitivity)     Status: None   Collection Time: 03/17/23 12:15 AM  Result Value Ref Range   Troponin I (High Sensitivity) 13 <18 ng/L    Comment: (NOTE) Elevated high sensitivity troponin I (hsTnI) values and significant  changes across serial measurements may suggest ACS but many other  chronic and acute conditions are known to elevate hsTnI results.  Refer to the "Links" section for chest pain algorithms and additional  guidance. Performed at [facility], [address] [place], [place] [address]   Magnesium     Status: Abnormal   Collection Time:  03/17/23 12:15 AM  Result Value Ref Range   Magnesium 1.6 (L) 1.7 - 2.4 mg/dL    Comment: Performed at [facility], [address], [place], [place] [address]  I-Stat Lactic Acid,  ED     Status: None   Collection Time: 03/17/23 12:24 AM  Result Value Ref Range   Lactic Acid, Venous 1.8 0.5 - 1.9 mmol/L  I-stat chem 8, ED     Status: Abnormal   Collection Time: 03/17/23 12:24 AM  Result Value Ref Range   Sodium 128 (L) 135 - 145 mmol/L   Potassium 4.3 3.5 - 5.1 mmol/L   Chloride 97 (L) 98 - 111 mmol/L   BUN 14 8 - 23 mg/dL   Creatinine, Ser 1.61 (H) 0.61 - 1.24 mg/dL   Glucose, Bld 096 (H) 70 - 99 mg/dL    Comment: Glucose reference range applies only to samples taken after fasting for at least 8 hours.   Calcium, Ion 1.10 (L) 1.15 - 1.40 mmol/L   TCO2 19 (L) 22 - 32 mmol/L   Hemoglobin 13.6 13.0 - 17.0 g/dL   HCT 04.5 40.9 - 81.1 %  Blood gas, venous ([facility], [facility], [facility])     Status: Abnormal   Collection Time: 03/17/23 12:25 AM  Result Value Ref Range   pH, Ven 7.25 7.25 - 7.43   pCO2, Ven <18 (LL) 44 - 60 mmHg    Comment: CRITICAL RESULT CALLED TO, READ BACK BY AND VERIFIED WITH: ELLWANGER,A AT 0055 ON 03/17/23 BY LUZOLOP    pO2, Ven <31 (LL) 32 - 45 mmHg    Comment: CRITICAL RESULT CALLED TO, READ BACK BY AND VERIFIED WITH: ELLWANGER,A AT 9147 ON 03/17/23 BY LUZOLOP    Bicarbonate 6.1 (L) 20.0 - 28.0 mmol/L   Acid-base deficit 18.4 (H) 0.0 - 2.0 mmol/L   O2 Saturation NOT CALCULATED %   Patient temperature 37.0     Comment: Performed at [facility], [address], [place], [place] [address]  Urinalysis, w/ Reflex to Culture (Infection Suspected) -Urine, Clean Catch     Status: None   Collection Time: 03/17/23  2:11 AM  Result Value Ref Range   Specimen Source URINE, CLEAN CATCH    Color, Urine YELLOW YELLOW   APPearance CLEAR CLEAR   Specific Gravity, Urine 1.017 1.005 - 1.030   pH 6.0 5.0 - 8.0   Glucose, UA NEGATIVE NEGATIVE mg/dL    Hgb urine dipstick NEGATIVE NEGATIVE   Bilirubin Urine NEGATIVE NEGATIVE   Ketones, ur NEGATIVE NEGATIVE mg/dL   Protein, ur NEGATIVE NEGATIVE mg/dL   Nitrite NEGATIVE NEGATIVE   Leukocytes,Ua NEGATIVE NEGATIVE   RBC / HPF 0-5 0 - 5 RBC/hpf   WBC, UA 0-5 0 - 5 WBC/hpf    Comment:        Reflex urine culture not performed if WBC <=10, OR if Squamous epithelial cells >5. If Squamous epithelial cells >5 suggest recollection.    Bacteria, UA NONE SEEN NONE SEEN   Squamous Epithelial / HPF 0-5 0 - 5 /HPF   Mucus PRESENT     Comment: Performed at [facility], [address], [place], [place] [address]  Na and K (sodium & potassium), rand urine     Status: None   Collection Time: 03/17/23  2:55 AM  Result Value Ref Range   Sodium, Ur 79 mmol/L   Potassium Urine 82 mmol/L    Comment: Performed at [facility], [address] [place], [place] [address]   CT CHEST ABDOMEN PELVIS WO CONTRAST  Result Date: 03/17/2023 CLINICAL DATA:  Sepsis EXAM: CT CHEST, ABDOMEN AND PELVIS WITHOUT CONTRAST TECHNIQUE: Multidetector CT imaging of the chest, abdomen and pelvis  was performed following the standard protocol without IV contrast. RADIATION DOSE REDUCTION: This exam was performed according to the departmental dose-optimization program which includes automated exposure control, adjustment of the mA and/or kV according to patient size and/or use of iterative reconstruction technique. COMPARISON:  11/16/2013 FINDINGS: CT CHEST FINDINGS Cardiovascular: Mild cardiomegaly. Small pericardial effusion. Diffuse coronary artery and aortic atherosclerosis. No aneurysm. Mediastinum/Nodes: No mediastinal, hilar, or axillary adenopathy. Trachea and esophagus are unremarkable. Thyroid unremarkable. Lungs/Pleura: Bibasilar atelectasis. No effusions or pneumothorax. No confluent airspace opacities. Calcified granuloma posteriorly in the left upper lobe. Musculoskeletal: Chest wall soft  tissues are unremarkable. No acute bony abnormality. CT ABDOMEN PELVIS FINDINGS Hepatobiliary: No focal hepatic abnormality. Gallbladder unremarkable. Pancreas: No focal abnormality or ductal dilatation. Spleen: No focal abnormality.  Normal size. Adrenals/Urinary Tract: Prior left nephrectomy. Adrenal glands unremarkable. No right renal stone, hydronephrosis or mass. Bladder wall is thickened and irregular with diverticula, likely related to chronic bladder outlet obstruction. Stomach/Bowel: Diffuse colonic diverticulosis, most pronounced in the left colon. No diverticulitis. Duodenal diverticula noted in the descending portion of the duodenum. Stomach unremarkable. No bowel obstruction. Vascular/Lymphatic: Aortic atherosclerosis. No evidence of aneurysm or adenopathy. Reproductive: Prostate enlargement Other: No free fluid or free air. Musculoskeletal: No acute bony abnormality. IMPRESSION: Cardiomegaly, coronary artery disease.  Aortic atherosclerosis. Bibasilar atelectasis. Colonic diverticulosis.  No active diverticulitis. Bladder wall thickening and trabeculation with several diverticula compatible with chronic bladder outlet obstruction. Prostate enlargement. No acute findings. Electronically Signed   By: Charlett Nose M.D.   On: 03/17/2023 00:44   CT Head Wo Contrast  Result Date: 03/17/2023 CLINICAL DATA:  Generalized weakness, altered mental status EXAM: CT HEAD WITHOUT CONTRAST TECHNIQUE: Contiguous axial images were obtained from the base of the skull through the vertex without intravenous contrast. RADIATION DOSE REDUCTION: This exam was performed according to the departmental dose-optimization program which includes automated exposure control, adjustment of the mA and/or kV according to patient size and/or use of iterative reconstruction technique. COMPARISON:  CT head 07/28/2010 CT abdomen pelvis 11/16/2013 FINDINGS: CT HEAD FINDINGS Brain: Progressive parenchymal volume loss is commensurate with  the patient's age. No acute intracranial hemorrhage or infarct. No abnormal mass effect or midline shift. No abnormal intra or extra-axial mass lesion or fluid collection. Ventricular size is prominent, but commensurate with the degree of parenchymal atrophy. Remote infarct within the right cerebellar hemisphere noted, new since prior examination. Vascular: No asymmetric hyperdense vasculature at the skull base. Skull: Normal. Negative for fracture or focal lesion. Sinuses/Orbits: No acute finding. Other: Mastoid air cells and middle ear cavities are are clear. IMPRESSION: 1. No acute intracranial hemorrhage or infarct. 2. Progressive parenchymal volume loss, commensurate with the patient's age. 3. Remote right cerebellar hemisphere infarct, new since prior examination. Electronically Signed   By: Helyn Numbers M.D.   On: 03/17/2023 00:18    Pending Labs Unresulted Labs (From admission, onward)     Start     Ordered   03/17/23 0500  Basic metabolic panel  Every 6 hours,   R (with STAT occurrences)      03/17/23 0407   03/17/23 0407  Osmolality  Add-on,   AD        03/17/23 0406   03/17/23 0407  Osmolality, urine  Add-on,   AD        03/17/23 0406   03/16/23 2333  Blood Culture (routine x 2)  (Undifferentiated presentation (screening labs and basic nursing orders))  BLOOD CULTURE X 2,   STAT  03/16/23 2333   Signed and Held  CBC  Tomorrow morning,   R        Signed and Held            Vitals/Pain Today's Vitals   03/16/23 2334 03/16/23 2345 03/17/23 0437  BP:  (!) 134/99 (!) 151/94  Pulse:  (!) 104 93  Resp:  18 18  Temp:  97.8 F (36.6 C) 97.8 F (36.6 C)  TempSrc:  Oral Oral  SpO2:  97% 98%  PainSc: 0-No pain      Isolation Precautions No active isolations  Medications Medications  0.9 %  sodium chloride infusion (has no administration in time range)  lactated ringers bolus 500 mL (0 mLs Intravenous Stopped 03/17/23 0143)  magnesium sulfate IVPB 2 g 50 mL (0 g  Intravenous Stopped 03/17/23 0325)    Mobility non-ambulatory

## 2023-03-17 NOTE — Progress Notes (Signed)
PT Cancellation Note  Patient Details Name: Justin Gallegos MRN: [id number] DOB: Jan 14, 1929   Cancelled Treatment:    Reason Eval/Treat Not Completed: Other (comment). Pt eating a sandwich in bed, slow to respond to verbal commands and questions, BP noted to be 185/123 while supine in bed. Will follow for eval as appropriate.    Domenick Bookbinder PT, DPT 03/17/23, 1:53 PM

## 2023-03-18 DIAGNOSIS — E871 Hypo-osmolality and hyponatremia: Secondary | ICD-10-CM | POA: Diagnosis not present

## 2023-03-18 DIAGNOSIS — E440 Moderate protein-calorie malnutrition: Secondary | ICD-10-CM | POA: Insufficient documentation

## 2023-03-18 MED ORDER — ENSURE ENLIVE PO LIQD
237.0000 mL | Freq: Three times a day (TID) | ORAL | Status: DC
  Administered 2023-03-18 – 2023-03-27 (×24): 237 mL via ORAL

## 2023-03-18 MED ORDER — ADULT MULTIVITAMIN W/MINERALS CH
1.0000 | ORAL_TABLET | Freq: Every day | ORAL | Status: DC
  Administered 2023-03-18 – 2023-03-27 (×10): 1 via ORAL
  Filled 2023-03-18 (×10): qty 1

## 2023-03-18 MED ORDER — MAGNESIUM SULFATE 2 GM/50ML IV SOLN
2.0000 g | Freq: Once | INTRAVENOUS | Status: AC
  Administered 2023-03-18: 2 g via INTRAVENOUS
  Filled 2023-03-18: qty 50

## 2023-03-18 MED ORDER — WARFARIN SODIUM 2 MG PO TABS
2.0000 mg | ORAL_TABLET | Freq: Once | ORAL | Status: AC
  Administered 2023-03-18: 2 mg via ORAL
  Filled 2023-03-18: qty 1

## 2023-03-18 NOTE — Progress Notes (Signed)
ANTICOAGULATION CONSULT NOTE - Initial Consult  Pharmacy Consult for Warfarin Indication: atrial fibrillation  Allergies  Allergen Reactions   Penicillins     Unable to breathe    Amoxicillin-Pot Clavulanate     REACTION: throat swelling    Patient Measurements: Height: 6' (182.9 cm) Weight: 86 kg (189 lb 9.5 oz) IBW/kg (Calculated) : 77.6    Vital Signs: Temp: 98.3 F (36.8 C) (08/06 0324) Temp Source: Oral (08/06 0324) BP: 126/104 (08/06 0324) Pulse Rate: 94 (08/06 0324)  Labs: Recent Labs    03/17/23 0015 03/17/23 0024 03/17/23 0605 03/17/23 1652 03/17/23 2344 03/18/23 0517  HGB 14.1 13.6 14.5  --   --  12.8*  HCT 40.3 40.0 42.3  --   --  36.7*  PLT 194  --  166  --   --  158  LABPROT 36.2*  --   --   --   --  31.7*  INR 3.6*  --   --   --   --  3.0*  CREATININE 1.48* 1.60* 1.35* 1.33* 1.29* 1.06  TROPONINIHS 13  --  15  --   --   --     Estimated Creatinine Clearance: 48.8 mL/min (by C-G formula based on SCr of 1.06 mg/dL).   Medical History: Past Medical History:  Diagnosis Date   Arthritis    Atrial fibrillation [facility])    sees Dr. Dietrich Pates    Cancer [facility])    ureteral, per Dr. Alberteen Sam disease    sees Dr. Stephannie Li    Hemorrhoids    Hyperlipidemia    Hypertension    Low back pain    TIA (transient ischemic attack)     Medications:  PTA Warfarin 2.5mg  daily except 3.75mg  Sun/Thur  Assessment:  89 yr male with generalized weakness and progressive confusion this past week.   CT Abdomen/pelvis with bladder wall thickening, trabeculation with severe diverticula compatible with chronic bladder outlet obstruction and prostate enlargement.  No previous history of BPH in the past.  PMH significant for HTN, AFib (on warfarin), colon cancer, TIA Patient reports LD of warfarin = 03/16/23 INR on admission 8/5 = 3.6 (supratherapeutic)  Today, 03/18/23  - INR = 3, upper limit of therapeutic range, after holding 8/5 dose. - Hgb= 12.8 g/dL,  slighlty low - Plt: 161 K/uL, WNL - no reports of bleeding per RN  Goal of Therapy:  INR 2-3 Monitor platelets by anticoagulation protocol: Yes   Plan:  - Warfarin 2mg  x 1 dose today (slightly reduced from home dose) - Daily PT/INR - CBC with AM Labs tomorrow  Josefa Half, PharmD 03/18/2023,9:47 AM

## 2023-03-18 NOTE — Progress Notes (Signed)
Initial Nutrition Assessment  DOCUMENTATION CODES:   Non-severe (moderate) malnutrition in context of acute illness/injury  INTERVENTION:   -Ensure Plus High Protein po TID, each supplement provides 350 kcal and 20 grams of protein.   -Multivitamin with minerals daily  NUTRITION DIAGNOSIS:   Moderate Malnutrition related to acute illness, lethargy/confusion as evidenced by mild fat depletion, mild muscle depletion, energy intake < 75% for > 7 days.  GOAL:   Patient will meet greater than or equal to 90% of their needs  MONITOR:   PO intake, Supplement acceptance, Labs, Weight trends, I & O's  REASON FOR ASSESSMENT:   Malnutrition Screening Tool    ASSESSMENT:   89 y.o. male with past medical history significant for chronic diastolic congestive heart failure (LVEF 55-60%), HTN, HLD, permanent atrial fibrillation on warfarin, history of TIA, chronic low back pain, hemorrhoids who presented to [facility] ED on 8/5 via EMS from home with progressive weakness, confusion; ongoing x 1 week.  Patient in room, sleeping. When woken a bit confused so unable to provide much history. Pt states he didn't drink any Ensure but when bottle was picked up, pt had drank ~75%. Encouraged pt to eat and drink supplements as much as he can.  Per chart review, pt has been confused with increased weakness for the last week.  SLP evaluated on 8/5, recommended dysphagia 3 diet.  Will continue Ensures and add daily MVI given poor PO.   Per weight records, pt has lost 12 lbs since June 2023.   Medications: IV Mg sulfate  Labs reviewed: Low Na   NUTRITION - FOCUSED PHYSICAL EXAM:  Flowsheet Row Most Recent Value  Orbital Region Mild depletion  Upper Arm Region Mild depletion  Thoracic and Lumbar Region Unable to assess  Buccal Region Moderate depletion  Temple Region Mild depletion  Clavicle Bone Region Mild depletion  Clavicle and Acromion Bone Region Mild depletion  Scapular Bone  Region Unable to assess  Dorsal Hand Mild depletion  Patellar Region Unable to assess  Anterior Thigh Region Unable to assess  Posterior Calf Region Unable to assess  Edema (RD Assessment) None  Hair Reviewed  Eyes Reviewed  Mouth Reviewed  Skin Reviewed       Diet Order:   Diet Order             DIET DYS 3 Room service appropriate? No; Fluid consistency: Thin  Diet effective now                   EDUCATION NEEDS:   No education needs have been identified at this time  Skin:  Skin Assessment: Reviewed RN Assessment  Last BM:  PTA  Height:   Ht Readings from Last 1 Encounters:  03/17/23 6' (1.829 m)    Weight:   Wt Readings from Last 1 Encounters:  03/18/23 86 kg    BMI:  Body mass index is 25.71 kg/m.  Estimated Nutritional Needs:   Kcal:  1850-2150  Protein:  80-95g  Fluid:  1.8L/day   Tilda Franco, MS, RD, LDN Inpatient Clinical Dietitian Contact information available via Amion

## 2023-03-18 NOTE — Progress Notes (Addendum)
PROGRESS NOTE    Justin Gallegos  ZHY:[id number] DOB: [birth year] DOA: 03/16/2023 PCP: Nelwyn Salisbury, MD    Brief Narrative:   Justin Gallegos is a 89 y.o. male with past medical history significant for chronic diastolic congestive heart failure (LVEF 55-60%), HTN, HLD, permanent atrial fibrillation on warfarin, history of TIA, chronic low back pain, hemorrhoids who presented to [facility] ED on 8/5 via EMS from home with progressive weakness, confusion; ongoing x 1 week.  At baseline, able to ambulate at home however was too weak to stand when attempting to transfer to his bedside commode.  On EMS arrival, patient was found to be in atrial fibrillation with intermittent tachycardia.  He was given 300 cc of IV fluids and brought to the ED for further evaluation.  In the ED, temperature 97.8 F, HR 104, RR 18, BP 134/99, SpO2 97% on room air.  WBC 9.8, hemoglobin 14.1, platelet count 194.  Sodium 125, potassium 4.2, chloride 97, CO2 17, glucose 114, BUN 15, creatinine 1.48.  Magnesium 1.6.  AST 27, ALT 20, total bilirubin 1.0.  High sensitive troponin 13.  Lactic acid 1.8.  CT head obtained which did not show any acute intracranial process, noted remote right cerebral infraction. CT chest stand abdomen pelvis showed cardiomegaly, coronary disease, aortic atherosclerosis.  Bibasilar atelectasis.  Colonic diverticulosis.  Prostate enlargement.  Bladder wall thickening and trabeculation with severe diverticular compatible of chronic bladder outlet obstruction.  No acute finding. Patient received LR 500 bolus.  Magnesium 2 g and started on NS 75 cc/h. Called urology Dr.Machen regarding the bladder obstruction and BPH with recommendation for foley and start Flomax.   TRH consulted for admission for further evaluation and management of acute metabolic encephalopathy, hyponatremia.  Assessment & Plan:   Hyponatremia Hypochloremia Patient presenting with sodium 125, chloride 97.  Complicated by  home Lasix use.  CT head unremarkable except right cerebral hemisphere remote infarction.  Urine sodium 79.  Urine SG 1.017 within normal limits.  Etiology likely secondary to DHI hydration, poor oral intake as well as home Lasix use. -- Na 125>128>126>128>129 -- NS at 125 mL/h -- BMP q6h -- goal correction of Na 8-10 meq over 24h  Hypocalcemia Serum calcium 8.3, ionized calcium 1.10 on admission. S/p Calcium gluconate 1 g IV 8/5 -- Repeat CMP in the a.m.  Hypomagnesemia Magnesium 1.7, will continue to replete. -- Repeat magnesium level in a.m.  Acute metabolic encephalopathy: Improving Etiology likely secondary to poor oral intake, electrolyte disturbance.  Patient is afebrile without leukocytosis.  CT head without contrast with no acute intracranial hemorrhage or infarct, progressive parenchymal volume loss, remote right cerebellar hemisphere infarct, which is new since prior examination.  CT chest/abdomen/pelvis with no acute findings.  -- Continue supportive care  Chronic bladder outlet obstruction BPH CT Abdomen/pelvis with bladder wall thickening, trabeculation with severe diverticula compatible with chronic bladder outlet obstruction and prostate enlargement.  No previous history of BPH in the past.  Admitting physician discussed with urology, Dr. Lafonda Mosses; recommended Foley catheter placement and initiation of Flomax. --Continue Foley catheter -- Flomax 0.4 mg p.o. daily -- Continue monitor urine output  Chronic/permanent atrial fibrillation on anticoagulation Essential hypertension Chronic diastolic congestive heart failure, compensated TTE 04/10/2020 with LVEF 55 to 60%, moderate LVH, no LV regional wall motion abnormalities, IVC normal in size, no aortic stenosis. -- Metoprolol succinate 25 mg p.o. daily -- Holding home furosemide as above -- Warfarin, pharmacy consulted for dosing/monitoring -- INR daily; 3.0 this  morning  Hyperlipidemia Aortic atherosclerosis --  Atorvastatin -- Fenofibrate  CKD stage IIIb Creatinine baseline 1.5-1.8.  Creatinine 1.60 on admission, at baseline. -- Cr 1.35>1.33>1.29>1.06>1.20 -- BMP daily  Weakness/debility/deconditioning/gait disturbance: Patient resides at home. -- PT/OT evaluation: Pending   DVT prophylaxis: SCDs Start: 03/17/23 0547 warfarin (COUMADIN) tablet 2 mg    Code Status: DNR Family Communication: No family present at bedside this morning  Disposition Plan:  Level of care: Progressive Status is: Inpatient Remains inpatient appropriate because: IV fluids, PT/OT evaluation, needs improvement of confusion and sodium level before safe for discharge home, anticipate 2-3 days    Consultants:  Urology  Procedures:  Foley catheter placement 8/5  Antimicrobials:  None   Subjective: Patient seen examined bedside, resting calmly.  Lying in bed.  Pleasantly confused.  RN present at bedside.  No family present.  Continues on IV fluids with gradual improvement in sodium.  Denies headache, no chest pain, no shortness of breath, no abdominal pain.  RN reports family with concerns of buildup of earwax, otherwise no acute concerns overnight per nursing staff.  Objective: Vitals:   03/18/23 0009 03/18/23 0324 03/18/23 0500 03/18/23 1147  BP: (!) 142/81 (!) 126/104  (!) 120/98  Pulse: 83 94  78  Resp: 15 15  16   Temp: 97.9 F (36.6 C) 98.3 F (36.8 C)  97.9 F (36.6 C)  TempSrc: Oral Oral    SpO2: 100% 99%  98%  Weight:   86 kg   Height:        Intake/Output Summary (Last 24 hours) at 03/18/2023 1246 Last data filed at 03/18/2023 0900 Gross per 24 hour  Intake 1887.57 ml  Output 1375 ml  Net 512.57 ml   Filed Weights   03/17/23 1546 03/18/23 0500  Weight: 87.1 kg 86 kg    Examination:  Physical Exam: GEN: NAD, alert, oriented to only his name/date of birth, not to time/place/person/situation, chronically ill/elderly in appearance HEENT: NCAT, PERRL, EOMI, sclera clear, dry mucous  membranes PULM: CTAB w/o wheezes/crackles, normal respiratory effort, on room air CV: irregularly irregular rhythm, normal rate w/o M/G/R GI: abd soft, NTND, NABS, no R/G/M MSK: no peripheral edema, moves all extremities independently NEURO: No focal neurological deficits Integumentary: No concerning rashes/lesions/wounds noted on exposed skin surfaces    Data Reviewed: I have personally reviewed following labs and imaging studies  CBC: Recent Labs  Lab 03/17/23 0015 03/17/23 0024 03/17/23 0605 03/18/23 0517  WBC 9.8  --  7.7 6.5  NEUTROABS 7.4  --   --   --   HGB 14.1 13.6 14.5 12.8*  HCT 40.3 40.0 42.3 36.7*  MCV 93.5  --  94.2 93.6  PLT 194  --  166 158   Basic Metabolic Panel: Recent Labs  Lab 03/17/23 0015 03/17/23 0024 03/17/23 0605 03/17/23 1652 03/17/23 2344 03/18/23 0517 03/18/23 1103  NA 125*   < > 126* 128* 125* 128* 129*  K 4.2   < > 4.4 3.8 3.9 4.1 4.0  CL 97*   < > 99 101 99 101 102  CO2 17*  --  18* 20* 19* 21* 19*  GLUCOSE 114*   < > 103* 131* 118* 105* 150*  BUN 15   < > 15 15 15 14 14   CREATININE 1.48*   < > 1.35* 1.33* 1.29* 1.06 1.20  CALCIUM 8.3*  --  7.9* 8.2* 8.0* 8.4* 8.2*  MG 1.6*  --  2.0  --   --  1.7  --  PHOS  --   --   --   --   --  2.7  --    < > = values in this interval not displayed.   GFR: Estimated Creatinine Clearance: 43.1 mL/min (by C-G formula based on SCr of 1.2 mg/dL). Liver Function Tests: Recent Labs  Lab 03/17/23 0015 03/18/23 0517  AST 27 25  ALT 20 17  ALKPHOS 44 42  BILITOT 1.0 1.1  PROT 5.7* 5.2*  ALBUMIN 3.0* 2.7*   No results for input(s): "LIPASE", "AMYLASE" in the last 168 hours. No results for input(s): "AMMONIA" in the last 168 hours. Coagulation Profile: Recent Labs  Lab 03/17/23 0015 03/18/23 0517  INR 3.6* 3.0*   Cardiac Enzymes: No results for input(s): "CKTOTAL", "CKMB", "CKMBINDEX", "TROPONINI" in the last 168 hours. BNP (last 3 results) No results for input(s): "PROBNP" in the last  8760 hours. HbA1C: No results for input(s): "HGBA1C" in the last 72 hours. CBG: No results for input(s): "GLUCAP" in the last 168 hours. Lipid Profile: No results for input(s): "CHOL", "HDL", "LDLCALC", "TRIG", "CHOLHDL", "LDLDIRECT" in the last 72 hours. Thyroid Function Tests: No results for input(s): "TSH", "T4TOTAL", "FREET4", "T3FREE", "THYROIDAB" in the last 72 hours. Anemia Panel: No results for input(s): "VITAMINB12", "FOLATE", "FERRITIN", "TIBC", "IRON", "RETICCTPCT" in the last 72 hours. Sepsis Labs: Recent Labs  Lab 03/17/23 0024 03/17/23 1431  LATICACIDVEN 1.8 1.2    Recent Results (from the past 240 hour(s))  Blood Culture (routine x 2)     Status: None (Preliminary result)   Collection Time: 03/17/23 12:15 AM   Specimen: BLOOD  Result Value Ref Range Status   Specimen Description   Final    BLOOD LEFT ANTECUBITAL Performed at [facility], [address] [place], [place] [address]    Special Requests   Final    Blood Culture adequate volume BOTTLES DRAWN AEROBIC AND ANAEROBIC Performed at [facility], [address] [place], [place] [address]    Culture   Final    NO GROWTH < 24 HOURS Performed at [facility], [address], [place], [place] [address]    Report Status PENDING  Incomplete  Blood Culture (routine x 2)     Status: None (Preliminary result)   Collection Time: 03/17/23 12:15 AM   Specimen: BLOOD  Result Value Ref Range Status   Specimen Description   Final    BLOOD BLOOD LEFT ARM Performed at [facility], [address], [place], [place] [address]    Special Requests   Final    Blood Culture adequate volume BOTTLES DRAWN AEROBIC AND ANAEROBIC Performed at [facility], [address], [place], [place] [address]    Culture   Final    NO GROWTH < 24 HOURS Performed at [facility], [address] [place], [place] [address]    Report Status PENDING   Incomplete         Radiology Studies: CT CHEST ABDOMEN PELVIS WO CONTRAST  Result Date: 03/17/2023 CLINICAL DATA:  Sepsis EXAM: CT CHEST, ABDOMEN AND PELVIS WITHOUT CONTRAST TECHNIQUE: Multidetector CT imaging of the chest, abdomen and pelvis was performed following the standard protocol without IV contrast. RADIATION DOSE REDUCTION: This exam was performed according to the departmental dose-optimization program which includes automated exposure control, adjustment of the mA and/or kV according to patient size and/or use of iterative reconstruction technique. COMPARISON:  11/16/2013 FINDINGS: CT CHEST FINDINGS Cardiovascular: Mild cardiomegaly. Small pericardial  effusion. Diffuse coronary artery and aortic atherosclerosis. No aneurysm. Mediastinum/Nodes: No mediastinal, hilar, or axillary adenopathy. Trachea and esophagus are unremarkable. Thyroid unremarkable. Lungs/Pleura: Bibasilar atelectasis. No effusions or pneumothorax. No confluent airspace opacities. Calcified granuloma posteriorly in the left upper lobe. Musculoskeletal: Chest wall soft tissues are unremarkable. No acute bony abnormality. CT ABDOMEN PELVIS FINDINGS Hepatobiliary: No focal hepatic abnormality. Gallbladder unremarkable. Pancreas: No focal abnormality or ductal dilatation. Spleen: No focal abnormality.  Normal size. Adrenals/Urinary Tract: Prior left nephrectomy. Adrenal glands unremarkable. No right renal stone, hydronephrosis or mass. Bladder wall is thickened and irregular with diverticula, likely related to chronic bladder outlet obstruction. Stomach/Bowel: Diffuse colonic diverticulosis, most pronounced in the left colon. No diverticulitis. Duodenal diverticula noted in the descending portion of the duodenum. Stomach unremarkable. No bowel obstruction. Vascular/Lymphatic: Aortic atherosclerosis. No evidence of aneurysm or adenopathy. Reproductive: Prostate enlargement Other: No free fluid or free air. Musculoskeletal: No acute  bony abnormality. IMPRESSION: Cardiomegaly, coronary artery disease.  Aortic atherosclerosis. Bibasilar atelectasis. Colonic diverticulosis.  No active diverticulitis. Bladder wall thickening and trabeculation with several diverticula compatible with chronic bladder outlet obstruction. Prostate enlargement. No acute findings. Electronically Signed   By: Charlett Nose M.D.   On: 03/17/2023 00:44   CT Head Wo Contrast  Result Date: 03/17/2023 CLINICAL DATA:  Generalized weakness, altered mental status EXAM: CT HEAD WITHOUT CONTRAST TECHNIQUE: Contiguous axial images were obtained from the base of the skull through the vertex without intravenous contrast. RADIATION DOSE REDUCTION: This exam was performed according to the departmental dose-optimization program which includes automated exposure control, adjustment of the mA and/or kV according to patient size and/or use of iterative reconstruction technique. COMPARISON:  CT head 07/28/2010 CT abdomen pelvis 11/16/2013 FINDINGS: CT HEAD FINDINGS Brain: Progressive parenchymal volume loss is commensurate with the patient's age. No acute intracranial hemorrhage or infarct. No abnormal mass effect or midline shift. No abnormal intra or extra-axial mass lesion or fluid collection. Ventricular size is prominent, but commensurate with the degree of parenchymal atrophy. Remote infarct within the right cerebellar hemisphere noted, new since prior examination. Vascular: No asymmetric hyperdense vasculature at the skull base. Skull: Normal. Negative for fracture or focal lesion. Sinuses/Orbits: No acute finding. Other: Mastoid air cells and middle ear cavities are are clear. IMPRESSION: 1. No acute intracranial hemorrhage or infarct. 2. Progressive parenchymal volume loss, commensurate with the patient's age. 3. Remote right cerebellar hemisphere infarct, new since prior examination. Electronically Signed   By: Helyn Numbers M.D.   On: 03/17/2023 00:18        Scheduled  Meds:  atorvastatin  40 mg Oral Daily   Chlorhexidine Gluconate Cloth  6 each Topical Daily   ezetimibe  10 mg Oral Daily   feeding supplement  237 mL Oral TID BM   fenofibrate  54 mg Oral Daily   metoprolol succinate  25 mg Oral Daily   multivitamin with minerals  1 tablet Oral Daily   mouth rinse  15 mL Mouth Rinse 4 times per day   sodium chloride flush  3 mL Intravenous Q12H   tamsulosin  0.4 mg Oral Daily   warfarin  2 mg Oral ONCE-1600   Warfarin - Pharmacist Dosing Inpatient   Does not apply q1600   Continuous Infusions:  sodium chloride     sodium chloride 100 mL/hr at 03/18/23 0723     LOS: 1 day    Time spent: 56 minutes spent on chart review, discussion with nursing staff, consultants, updating family and interview/physical exam; more  than 50% of that time was spent in counseling and/or coordination of care.    Alvira Philips [place], DO [facility] Available via Epic secure chat 7am-7pm After these hours, please refer to coverage provider listed on [email or web address] 03/18/2023, 12:46 PM

## 2023-03-18 NOTE — Evaluation (Signed)
Physical Therapy Evaluation Patient Details Name: Justin Gallegos MRN: [id number] DOB: Nov 07, 1928 Today's Date: 03/18/2023  History of Present Illness  89 yo male admitted with hyponatremia, weakness, acute met encephalopathy. Hx of HF, essential HTN, Afib, TIA, chronic LBP, OA  Clinical Impression  On eval, pt required Mod A for mobility. He was able to stand and take several side steps along the bedside. He was quite unsteady-at risk for falls without assistance. Pt presents with general weakness, decreased activity tolerance, and impaired balance. RW not present in room so unable to safely attempt ambulation on today. No family present during session. Will plan to follow and progress activity as tolerated. Patient will benefit from continued inpatient follow up therapy, <3 hours/day         If plan is discharge home, recommend the following: A little help with walking and/or transfers;A little help with bathing/dressing/bathroom;Assistance with cooking/housework;Assist for transportation;Help with stairs or ramp for entrance   Can travel by private vehicle        Equipment Recommendations Rolling walker (2 wheels)  Recommendations for Other Services  OT consult    Functional Status Assessment Patient has had a recent decline in their functional status and demonstrates the ability to make significant improvements in function in a reasonable and predictable amount of time.     Precautions / Restrictions Precautions Precautions: Fall Restrictions Weight Bearing Restrictions: No      Mobility  Bed Mobility Overal bed mobility: Needs Assistance Bed Mobility: Supine to Sit, Sit to Supine     Supine to sit: Min assist, HOB elevated Sit to supine: Min assist, HOB elevated   General bed mobility comments: Increased time. Cues provided. Small amount of assist provided for safey and management of lines.    Transfers Overall transfer level: Needs assistance Equipment used: 1  person hand held assist Transfers: Sit to/from Stand Sit to Stand: From elevated surface, Mod assist           General transfer comment: Increased time. Wide BOS. Assist to power up, stabilize, control descent. Cues provided.    Ambulation/Gait Ambulation/Gait assistance: Mod assist             General Gait Details: side steps along bedside with therapist providing pt 1 HHA and pt holding on to IV pole with opposite hand. Unsteady. Cues for safety. RW not present in room so deferred on today for safety.  Stairs            Wheelchair Mobility     Tilt Bed    Modified Rankin (Stroke Patients Only)       Balance Overall balance assessment: Needs assistance         Standing balance support: Bilateral upper extremity supported Standing balance-Leahy Scale: Poor                               Pertinent Vitals/Pain Pain Assessment Pain Assessment: No/denies pain    Home Living   Living Arrangements: Spouse/significant other   Type of Home: House Home Access: Stairs to enter   [employer] of Steps: 2   Home Layout: One level Home Equipment: Cane - single point      Prior Function Prior Level of Function : Independent/Modified Independent                     Hand Dominance        Extremity/Trunk Assessment   Upper  Extremity Assessment Upper Extremity Assessment: Defer to OT evaluation    Lower Extremity Assessment Lower Extremity Assessment: Generalized weakness    Cervical / Trunk Assessment Cervical / Trunk Assessment: Normal  Communication   Communication: HOH  Cognition Arousal/Alertness: Awake/alert Behavior During Therapy: WFL for tasks assessed/performed Overall Cognitive Status: No family/caregiver present to determine baseline cognitive functioning                                 General Comments: no family present to confirm PLOF/home info. pt is a bit [facility]        General  Comments      Exercises     Assessment/Plan    PT Assessment Patient needs continued PT services  PT Problem List Decreased strength;Decreased balance;Decreased mobility;Decreased activity tolerance;Decreased cognition;Decreased knowledge of use of DME       PT Treatment Interventions DME instruction;Gait training;Therapeutic exercise;Balance training;Functional mobility training;Therapeutic activities;Patient/family education    PT Goals (Current goals can be found in the Care Plan section)  Acute Rehab PT Goals Patient Stated Goal: none stated PT Goal Formulation: With patient Time For Goal Achievement: 04/01/23 Potential to Achieve Goals: Good    Frequency Min 1X/week     Co-evaluation               AM-PAC PT "6 Clicks" Mobility  Outcome Measure Help needed turning from your back to your side while in a flat bed without using bedrails?: A Little Help needed moving from lying on your back to sitting on the side of a flat bed without using bedrails?: A Little Help needed moving to and from a bed to a chair (including a wheelchair)?: A Little Help needed standing up from a chair using your arms (e.g., wheelchair or bedside chair)?: A Little Help needed to walk in hospital room?: A Little Help needed climbing 3-5 steps with a railing? : A Lot 6 Click Score: 17    End of Session   Activity Tolerance: Patient tolerated treatment well Patient left: in bed;with call bell/phone within reach;with bed alarm set   PT Visit Diagnosis: Muscle weakness (generalized) (M62.81);Difficulty in walking, not elsewhere classified (R26.2)    Time: 1610-9604 PT Time Calculation (min) (ACUTE ONLY): 15 min   Charges:   PT Evaluation $PT Eval Low Complexity: 1 Low   PT General Charges $$ ACUTE PT VISIT: 1 Visit            Faye Ramsay, PT Acute Rehabilitation  Office: [phone]

## 2023-03-18 NOTE — Progress Notes (Addendum)
OT Cancellation Note  Patient Details Name: Justin Gallegos MRN: [id number] DOB: 12/15/1928   Cancelled Treatment:    Reason Eval/Treat Not Completed: Other (comment). The pt was sleeping soundly with his family present in the room. Will follow up at later date.    Reuben Likes, OTR/L 03/18/2023, 5:22 PM

## 2023-03-18 NOTE — Progress Notes (Signed)
PHARMACY - PHYSICIAN COMMUNICATION CRITICAL VALUE ALERT - BLOOD CULTURE IDENTIFICATION (BCID)  Justin Gallegos is an 89 y.o. male who presented to [facility] on 03/16/2023 with a chief complaint of generalized weakness. One of four blood culture bottles collected on 03/17/23 has GPC (nothing identified on BCID).  - afeb - wbc wnl  Name of physician (or Provider) Contacted: Chinita Greenland, PA  Current antibiotics: not on abx   Changes to prescribed antibiotics recommended:  - will monitor pt off abx for now  Results for orders placed or performed during the hospital encounter of 03/16/23  Blood Culture ID Panel (Reflexed) (Collected: 03/17/2023 12:15 AM)  Result Value Ref Range   Enterococcus faecalis NOT DETECTED NOT DETECTED   Enterococcus Faecium NOT DETECTED NOT DETECTED   Listeria monocytogenes NOT DETECTED NOT DETECTED   Staphylococcus species NOT DETECTED NOT DETECTED   Staphylococcus aureus (BCID) NOT DETECTED NOT DETECTED   Staphylococcus epidermidis NOT DETECTED NOT DETECTED   Staphylococcus lugdunensis NOT DETECTED NOT DETECTED   Streptococcus species NOT DETECTED NOT DETECTED   Streptococcus agalactiae NOT DETECTED NOT DETECTED   Streptococcus pneumoniae NOT DETECTED NOT DETECTED   Streptococcus pyogenes NOT DETECTED NOT DETECTED   A.calcoaceticus-baumannii NOT DETECTED NOT DETECTED   Bacteroides fragilis NOT DETECTED NOT DETECTED   Enterobacterales NOT DETECTED NOT DETECTED   Enterobacter cloacae complex NOT DETECTED NOT DETECTED   Escherichia coli NOT DETECTED NOT DETECTED   Klebsiella aerogenes NOT DETECTED NOT DETECTED   Klebsiella oxytoca NOT DETECTED NOT DETECTED   Klebsiella pneumoniae NOT DETECTED NOT DETECTED   Proteus species NOT DETECTED NOT DETECTED   Salmonella species NOT DETECTED NOT DETECTED   Serratia marcescens NOT DETECTED NOT DETECTED   Haemophilus influenzae NOT DETECTED NOT DETECTED   Neisseria meningitidis NOT DETECTED NOT DETECTED   Pseudomonas  aeruginosa NOT DETECTED NOT DETECTED   Stenotrophomonas maltophilia NOT DETECTED NOT DETECTED   Candida albicans NOT DETECTED NOT DETECTED   Candida auris NOT DETECTED NOT DETECTED   Candida glabrata NOT DETECTED NOT DETECTED   Candida krusei NOT DETECTED NOT DETECTED   Candida parapsilosis NOT DETECTED NOT DETECTED   Candida tropicalis NOT DETECTED NOT DETECTED   Cryptococcus neoformans/gattii NOT DETECTED NOT DETECTED    Lucia Gaskins 03/18/2023  8:42 PM

## 2023-03-19 DIAGNOSIS — E871 Hypo-osmolality and hyponatremia: Secondary | ICD-10-CM | POA: Diagnosis not present

## 2023-03-19 DIAGNOSIS — R531 Weakness: Secondary | ICD-10-CM

## 2023-03-19 MED ORDER — DOCUSATE SODIUM 100 MG PO CAPS: 100.0000 mg | ORAL_CAPSULE | Freq: Every day | ORAL | Status: DC | PRN

## 2023-03-19 MED ORDER — WARFARIN SODIUM 2.5 MG PO TABS
2.5000 mg | ORAL_TABLET | Freq: Once | ORAL | Status: AC
  Administered 2023-03-19: 2.5 mg via ORAL
  Filled 2023-03-19: qty 1

## 2023-03-19 NOTE — TOC Initial Note (Signed)
Transition of Care [facility]) - Initial/Assessment Note    Patient Details  Name: Justin Gallegos MRN: [id number] Date of Birth: [birth year]  Transition of Care [facility]) CM/SW Contact:    Lanier Clam, RN Phone Number: 03/19/2023, 8:26 PM  Clinical Narrative:  Per permission faxed out await bed offers,then choice,then contact [facility] for auth for ST SNF, & PTAR.                 Expected Discharge Plan: Skilled Nursing Facility Barriers to Discharge: Continued Medical Work up   Patient Goals and CMS Choice Patient states their goals for this hospitalization and ongoing recovery are:: rehab CMS Medicare.gov Compare Post Acute Care list provided to:: Patient Represenative (must comment) ([name](spouse)) Choice offered to / list presented to : Spouse [place] ownership interest in [facility].provided to:: Spouse    Expected Discharge Plan and Services   Discharge Planning Services: CM Consult   Living arrangements for the past 2 months: Single Family Home                                      Prior Living Arrangements/Services Living arrangements for the past 2 months: Single Family Home Lives with:: Spouse Patient language and need for interpreter reviewed:: Yes Do you feel safe going back to the place where you live?: Yes      Need for Family Participation in Patient Care: Yes (Comment) Care giver support system in place?: Yes (comment)   Criminal Activity/Legal Involvement Pertinent to Current Situation/Hospitalization: No - Comment as needed  Activities of Daily Living Home Assistive Devices/Equipment: Walker (specify type) ADL Screening (condition at time of admission) Patient's cognitive ability adequate to safely complete daily activities?: No Is the patient deaf or have difficulty hearing?: Yes Does the patient have difficulty seeing, even when wearing glasses/contacts?: Yes Does the patient have difficulty concentrating, remembering, or making decisions?:  Yes Patient able to express need for assistance with ADLs?: Yes Does the patient have difficulty dressing or bathing?: Yes Independently performs ADLs?: No Communication: Needs assistance Is this a change from baseline?: Pre-admission baseline Dressing (OT): Needs assistance Is this a change from baseline?: Pre-admission baseline Grooming: Needs assistance Is this a change from baseline?: Pre-admission baseline Feeding: Needs assistance Is this a change from baseline?: Pre-admission baseline Bathing: Needs assistance Is this a change from baseline?: Pre-admission baseline Toileting: Needs assistance Is this a change from baseline?: Pre-admission baseline In/Out Bed: Needs assistance Is this a change from baseline?: Pre-admission baseline Walks in Home: Needs assistance Is this a change from baseline?: Pre-admission baseline Does the patient have difficulty walking or climbing stairs?: Yes Weakness of Legs: Both Weakness of Arms/Hands: Both  Permission Sought/Granted Permission sought to share information with : Case Manager Permission granted to share information with : Yes, Verbal Permission Granted  Share Information with NAME: Case Manager           Emotional Assessment Appearance:: Appears stated age Attitude/Demeanor/Rapport: Gracious Affect (typically observed): Accepting Orientation: : Oriented to Self, Oriented to Place, Oriented to  Time, Oriented to Situation Alcohol / Substance Use: Not Applicable Psych Involvement: No (comment)  Admission diagnosis:  Hyponatremia [E87.1] Generalized weakness [R53.1] Patient Active Problem List   Diagnosis Date Noted   Malnutrition of moderate degree 03/18/2023   Hyponatremia 03/17/2023   Hypomagnesemia 03/17/2023   Acute metabolic encephalopathy 03/17/2023   BPH (benign prostatic hyperplasia) 03/17/2023   Atherosclerosis 03/17/2023  Chronic diastolic CHF (congestive heart failure) (HCC) 03/17/2023   Atrial fibrillation,  chronic (HCC) 03/17/2023   Mild bibasilar atelectasis 03/17/2023   Metabolic acidosis 03/17/2023   Bilateral leg edema 01/28/2022   Stasis dermatitis of both legs 01/28/2022   Ankle edema, bilateral 02/09/2020   B12 deficiency 09/01/2018   Long term (current) use of anticoagulants 07/16/2017   Branch retinal vein occlusion of left eye 10/14/2016   Rectal itching 04/05/2014   Tinea cruris 04/05/2014   Encounter for therapeutic drug monitoring 09/27/2013   Carotid bruit 07/21/2013   Infected sebaceous cyst of skin 02/19/2011   Chronic anticoagulation 02/19/2011   Transient cerebral ischemia 07/31/2010   ACUTE BRONCHITIS 05/29/2010   TUBULOVILLOUS ADENOMA, COLON, HX OF 05/17/2010   Disorder resulting from impaired renal function 10/24/2009   PERSONAL HX MALIG NEOPLASM OTH URINARY ORGAN 03/02/2009   ALLERGIC RHINITIS 11/03/2008   LOW BACK PAIN 04/19/2008   Hyperlipidemia 06/11/2007   Essential hypertension 06/11/2007   OSTEOARTHRITIS 06/11/2007   CHICKENPOX, HX OF 06/11/2007   Atrial fibrillation (HCC) 03/13/2007   PCP:  Nelwyn Salisbury, MD Pharmacy:   [facility] [address] [place] - [address] [place] [address] Phone: [phone] Fax: [phone]     Social Determinants of Health (SDOH) Social History: SDOH Screenings   Food Insecurity: No Food Insecurity (03/17/2023)  Housing: Low Risk  (03/17/2023)  Transportation Needs: No Transportation Needs (03/17/2023)  Utilities: Not At Risk (03/17/2023)  Alcohol Screen: Low Risk  (12/31/2022)  Depression (PHQ2-9): Low Risk  (12/31/2022)  Financial Resource Strain: Low Risk  (12/31/2022)  Physical Activity: Inactive (12/31/2022)  Social Connections: Moderately Integrated (12/31/2022)  Stress: No Stress Concern Present (12/31/2022)  Tobacco Use: Low Risk  (03/17/2023)   SDOH Interventions:     Readmission Risk Interventions     No data to display

## 2023-03-19 NOTE — Plan of Care (Signed)
?  Problem: Clinical Measurements: ?Goal: Will remain free from infection ?Outcome: Progressing ?Goal: Diagnostic test results will improve ?Outcome: Progressing ?Goal: Respiratory complications will improve ?Outcome: Progressing ?  ?Problem: Pain Managment: ?Goal: General experience of comfort will improve ?Outcome: Progressing ?  ?Problem: Safety: ?Goal: Ability to remain free from injury will improve ?Outcome: Progressing ?  ?

## 2023-03-19 NOTE — Plan of Care (Signed)
  Problem: Clinical Measurements: Goal: Ability to maintain clinical measurements within normal limits will improve Outcome: Progressing   Problem: Nutrition: Goal: Adequate nutrition will be maintained Outcome: Progressing   Problem: Education: Goal: Knowledge of General Education information will improve Description: Including pain rating scale, medication(s)/side effects and non-pharmacologic comfort measures Outcome: Not Progressing   Problem: Health Behavior/Discharge Planning: Goal: Ability to manage health-related needs will improve Outcome: Not Progressing   Problem: Activity: Goal: Risk for activity intolerance will decrease Outcome: Not Progressing

## 2023-03-19 NOTE — Evaluation (Signed)
Occupational Therapy Evaluation Patient Details Name: Justin Gallegos MRN: [id number] DOB: [birth year] Today's Date: 03/19/2023   History of Present Illness 89 yr old male admitted with weakness and confusion. He was found to have hyponatremia and acute metabolic encephalopathy. PMH: HF, essential HTN, Afib, TIA, arthritis,  ureteral CA   Clinical Impression   The pt is currently limited by the below listed deficits (see OT problem list), which compromises his ADL performance and overall functional independence. He requires increased assist for tasks, including sit to stand, lower body dressing, and toileting. He reported managing ADLs without assist at his baseline, however this may need to be verified.  He will benefit from further OT services to maximize his safety and independence with ADLs and to decrease the risk for restricted participation in meaningful activities.       If plan is discharge home, recommend the following: A lot of help with walking and/or transfers;A lot of help with bathing/dressing/bathroom;Assistance with cooking/housework;Assist for transportation;Direct supervision/assist for medications management    Functional Status Assessment  Patient has had a recent decline in their functional status and demonstrates the ability to make significant improvements in function in a reasonable and predictable amount of time.  Equipment Recommendations  Other (comment) (defer to next level of care)    Recommendations for Other Services       Precautions / Restrictions Precautions Precautions: Fall Restrictions Weight Bearing Restrictions: No      Mobility Bed Mobility Overal bed mobility: Needs Assistance Bed Mobility: Supine to Sit     Supine to sit: Min assist, HOB elevated          Transfers Overall transfer level: Needs assistance Equipment used: Rolling walker (2 wheels) Transfers: Sit to/from Stand Sit to Stand: From elevated surface, Mod assist      Step pivot transfers: Min assist (using RW to the bedside chair)            Balance       Sitting balance - Comments: static sitting-good. dynamic sitting-fair     Standing balance-Leahy Scale: Poor           ADL either performed or assessed with clinical judgement   ADL Overall ADL's : Needs assistance/impaired Eating/Feeding: Set up;Sitting Eating/Feeding Details (indicate cue type and reason): at chair level Grooming: Minimal assistance;Sitting Grooming Details (indicate cue type and reason): simulated seated in chair         Upper Body Dressing : Moderate assistance;Sitting   Lower Body Dressing: Maximal assistance;Sitting/lateral leans       Toileting- Clothing Manipulation and Hygiene: Maximal assistance;Sit to/from stand Toileting - Clothing Manipulation Details (indicate cue type and reason): at bedside commode level, based on clinical judgement             Vision   Additional Comments: he correctly read the time depicted on the wall clock            Pertinent Vitals/Pain Pain Assessment Pain Assessment: No/denies pain     Extremity/Trunk Assessment Upper Extremity Assessment Upper Extremity Assessment: Overall WFL for tasks assessed (BUE AROM WFL. Functional grip strength bilaterally)   Lower Extremity Assessment Lower Extremity Assessment: Generalized weakness       Communication     Cognition Arousal: Alert   Overall Cognitive Status: No family/caregiver present to determine baseline cognitive functioning    General Comments: Oriented to person and place; disoriented to time and situation. Cooperative. Able to follow 1 step commands  Home Living   Living Arrangements: Spouse/significant other   Type of Home: House       Home Layout: Other (Comment) (He stated his house is 2 stories. He was unable to recall if his bedroom is on the main or upper level of the home.)               Home Equipment: Cane  - single point;Rolling Walker (2 wheels)   Additional Comments: The pt appeared to be a questionable historian at times, therefore information contained here may need to be verified.      Prior Functioning/Environment Prior Level of Function : Independent/Modified Independent             Mobility Comments: He reported using a cane and RW "sometimes." ADLs Comments: He reported being independent with ADLs and his spouse managed the cooking and cleaning.        OT Problem List: Decreased strength;Impaired balance (sitting and/or standing);Decreased coordination;Decreased cognition;Decreased knowledge of use of DME or AE      OT Treatment/Interventions: Self-care/ADL training;Therapeutic exercise;Energy conservation;DME and/or AE instruction;Therapeutic activities;Patient/family education;Balance training    OT Goals(Current goals can be found in the care plan section) Acute Rehab OT Goals Patient Stated Goal: to go home OT Goal Formulation: With patient Time For Goal Achievement: 04/02/23 Potential to Achieve Goals: Good ADL Goals Pt Will Perform Grooming: with set-up;sitting Pt Will Perform Upper Body Dressing: with set-up;sitting Pt Will Perform Lower Body Dressing: with supervision;sit to/from stand Pt Will Transfer to Toilet: with supervision;ambulating Pt Will Perform Toileting - Clothing Manipulation and hygiene: with supervision;sit to/from stand  OT Frequency: Min 1X/week       AM-PAC OT "6 Clicks" Daily Activity     Outcome Measure Help from another person eating meals?: A Little Help from another person taking care of personal grooming?: A Little Help from another person toileting, which includes using toliet, bedpan, or urinal?: A Lot Help from another person bathing (including washing, rinsing, drying)?: A Lot Help from another person to put on and taking off regular upper body clothing?: A Lot Help from another person to put on and taking off regular lower body  clothing?: A Lot 6 Click Score: 14   End of Session Equipment Utilized During Treatment: Rolling walker (2 wheels) Nurse Communication: Mobility status  Activity Tolerance: Patient tolerated treatment well Patient left: in chair;with call bell/phone within reach;with chair alarm set  OT Visit Diagnosis: Unsteadiness on feet (R26.81);Muscle weakness (generalized) (M62.81)                Time: 1478-2956 OT Time Calculation (min): 25 min Charges:  OT General Charges $OT Visit: 1 Visit OT Evaluation $OT Eval Moderate Complexity: 1 Mod     L , OTR/L 03/19/2023, 10:28 AM

## 2023-03-19 NOTE — Plan of Care (Signed)
  Problem: Education: Goal: Knowledge of General Education information will improve Description: Including pain rating scale, medication(s)/side effects and non-pharmacologic comfort measures Outcome: Progressing   Problem: Clinical Measurements: Goal: Respiratory complications will improve Outcome: Progressing Goal: Cardiovascular complication will be avoided Outcome: Progressing   Problem: Coping: Goal: Level of anxiety will decrease Outcome: Progressing   Problem: Safety: Goal: Ability to remain free from injury will improve Outcome: Progressing

## 2023-03-19 NOTE — Progress Notes (Signed)
ANTICOAGULATION CONSULT NOTE - Initial Consult  Pharmacy Consult for Warfarin Indication: atrial fibrillation  Allergies  Allergen Reactions   Penicillins     Unable to breathe    Amoxicillin-Pot Clavulanate     REACTION: throat swelling    Patient Measurements: Height: 6' (182.9 cm) Weight: 86 kg (189 lb 9.5 oz) IBW/kg (Calculated) : 77.6    Vital Signs: Temp: 98 F (36.7 C) (08/07 0457) Temp Source: Oral (08/07 0457) BP: 124/72 (08/07 0831) Pulse Rate: 98 (08/07 0831)  Labs: Recent Labs    03/17/23 0015 03/17/23 0024 03/17/23 1610 03/17/23 1652 03/18/23 0517 03/18/23 1103 03/18/23 1720 03/18/23 2251 03/19/23 0453  HGB 14.1   < > 14.5  --  12.8*  --   --   --  12.9*  HCT 40.3   < > 42.3  --  36.7*  --   --   --  38.2*  PLT 194  --  166  --  158  --   --   --  153  LABPROT 36.2*  --   --   --  31.7*  --   --   --  28.4*  INR 3.6*  --   --   --  3.0*  --   --   --  2.6*  CREATININE 1.48*   < > 1.35*   < > 1.06   < > 1.13 1.13 1.13  TROPONINIHS 13  --  15  --   --   --   --   --   --    < > = values in this interval not displayed.    Estimated Creatinine Clearance: 45.8 mL/min (by C-G formula based on SCr of 1.13 mg/dL).   Medical History: Past Medical History:  Diagnosis Date   Arthritis    Atrial fibrillation [facility])    sees Dr. Dietrich Pates    Cancer [facility])    ureteral, per Dr. Alberteen Sam disease    sees Dr. Stephannie Li    Hemorrhoids    Hyperlipidemia    Hypertension    Low back pain    TIA (transient ischemic attack)     Medications:  PTA Warfarin 2.5mg  daily except 3.75mg  Sun/Thur  Assessment:  89 yr male with generalized weakness and progressive confusion this past week.   CT Abdomen/pelvis with bladder wall thickening, trabeculation with severe diverticula compatible with chronic bladder outlet obstruction and prostate enlargement.  No previous history of BPH in the past.  PMH significant for HTN, AFib (on warfarin), colon cancer,  TIA Patient reports LD of warfarin = 03/16/23 INR on admission 8/5 = 3.6 (supratherapeutic)  Today, 03/19/23  - INR = 2.6, therapeutic  - Hgb= 12.9 g/dL, slighlty low - Plt: 960 K/uL, WNL - no reports of bleeding per RN  Goal of Therapy:  INR 2-3 Monitor platelets by anticoagulation protocol: Yes   Plan:  - Warfarin 2.5mg  x 1 dose today  - Daily PT/INR - CBC daily x 3 days. - Consider dose reduction at discharge.  Josefa Half, PharmD 03/19/2023,9:51 AM

## 2023-03-19 NOTE — Progress Notes (Signed)
Physical Therapy Treatment Patient Details Name: Justin Gallegos MRN: [id number] DOB: [birth year] Today's Date: 03/19/2023   History of Present Illness 89 yr old male admitted with weakness and confusion. He was found to have hyponatremia and acute metabolic encephalopathy. PMH: HF, essential HTN, Afib, TIA, arthritis,  ureteral CA    PT Comments  Per family, pt just back to bed after sitting up in recliner for quite some time. Pt participated with UE/LE bed exercises-he tolerated activity well. He appeared a bit drowsy/sleepy. Family feels he is starting to clear but a bit but that he is not quite back to baseline. He used a rollator for ambulation and required A for ADLs. Patient will benefit from continued inpatient follow up therapy, <3 hours/day     If plan is discharge home, recommend the following: A little help with walking and/or transfers;A little help with bathing/dressing/bathroom;Assistance with cooking/housework;Assist for transportation;Help with stairs or ramp for entrance   Can travel by private vehicle     No  Equipment Recommendations   (has rollator at home; TBD at next venue)    Recommendations for Other Services       Precautions / Restrictions Precautions Precautions: Fall Restrictions Weight Bearing Restrictions: No     Mobility  Bed Mobility               General bed mobility comments: NT-pt just back to bed. He did participate with UE/LE exercises    Transfers                        Ambulation/Gait                   Stairs             Wheelchair Mobility     Tilt Bed    Modified Rankin (Stroke Patients Only)       Balance                                            Cognition Arousal: Alert (but drowsy) Behavior During Therapy: WFL for tasks assessed/performed Overall Cognitive Status: Impaired/Different from baseline                                 General Comments:  Oriented to person and place; disoriented to time and situation. Cooperative. Able to follow 1 step commands        Exercises General Exercises - Upper Extremity Shoulder Flexion: AROM, AAROM, Both General Exercises - Lower Extremity Ankle Circles/Pumps: Both, 10 reps Heel Slides: AAROM, 10 reps, AROM, Both Hip ABduction/ADduction: AAROM, AROM, Both, 10 reps    General Comments        Pertinent Vitals/Pain Pain Assessment Pain Assessment: No/denies pain    Home Living                          Prior Function            PT Goals (current goals can now be found in the care plan section) Progress towards PT goals: Progressing toward goals    Frequency    Min 1X/week      PT Plan      Co-evaluation  AM-PAC PT "6 Clicks" Mobility   Outcome Measure  Help needed turning from your back to your side while in a flat bed without using bedrails?: A Little Help needed moving from lying on your back to sitting on the side of a flat bed without using bedrails?: A Little Help needed moving to and from a bed to a chair (including a wheelchair)?: A Lot Help needed standing up from a chair using your arms (e.g., wheelchair or bedside chair)?: A Lot Help needed to walk in hospital room?: A Lot Help needed climbing 3-5 steps with a railing? : Total 6 Click Score: 13    End of Session   Activity Tolerance: Patient tolerated treatment well Patient left: in bed;with call bell/phone within reach;with bed alarm set;with family/visitor present   PT Visit Diagnosis: Muscle weakness (generalized) (M62.81);Difficulty in walking, not elsewhere classified (R26.2)     Time: 6045-4098 PT Time Calculation (min) (ACUTE ONLY): 8 min  Charges:    $Therapeutic Exercise: 8-22 mins PT General Charges $$ ACUTE PT VISIT: 1 Visit                         Faye Ramsay, PT Acute Rehabilitation  Office: [phone]

## 2023-03-19 NOTE — Hospital Course (Addendum)
Brief Narrative:  89 year old with history of diastolic CHF EF 60%, HTN, HLD, permanent A-fib on Coumadin, TIA, chronic low back pain, hemorrhoids admitted to [facility] long ED on 8/5 due to progressive weakness and confusion.  Also had a brief episode of atrial fibrillation with RVR.  Upon admission CT of the head was negative, CT abdomen pelvis did not show any acute pathology besides chronic bladder outlet obstruction.  He was noted to have multiple electrolyte abnormality including hyponatremia, hypochloremia, hypocalcemia and hypomagnesemia.  Suspected secondary to dehydration which improved with IV fluids.  PT OT recommended SNF, palliative care service to continue to follow at SNF as well.       Assessment & Plan:  Principal Problem:   Hyponatremia Active Problems:   Hypomagnesemia   Acute metabolic encephalopathy   BPH (benign prostatic hyperplasia)   Mild bibasilar atelectasis   Chronic diastolic CHF (congestive heart failure) (HCC)   Atrial fibrillation, chronic (HCC)   Metabolic acidosis   Hyperlipidemia   Essential hypertension   Atherosclerosis   Malnutrition of moderate degree     Hyponatremia/hypocalcemia/hypomagnesemia Hypochloremia This is secondary to dehydration from Lasix use and poor oral intake.  Appears to have improved with repletion.  Currently sodium level remains stable around 129    Acute metabolic encephalopathy with underlying dementia: Improving Secondary to poor oral intake.  CT of the head is negative, CT abdomen pelvis did not show any acute pathology.  Mentation is slowly improving.  Seen by palliative care services, patient is now DNR with recommendations for palliative care service to continue to follow-up outpatient.   Chronic bladder outlet obstruction BPH CT Abdomen/pelvis with bladder wall thickening, trabeculation with severe diverticula compatible with chronic bladder outlet obstruction and prostate enlargement.  No previous history of BPH in the  past.  Admitting physician discussed with urology, Dr. Lafonda Mosses; recommended Foley catheter placement and initiation of Flomax.  Will require outpatient follow-up   Chronic/permanent atrial fibrillation on anticoagulation Essential hypertension Acutely decompensated Chronic diastolic congestive heart failure TTE 04/10/2020 with LVEF 55 to 60%, moderate LVH, no LV regional wall motion abnormalities, IVC normal in size, no aortic stenosis.  Currently on Toprol-XL, continue Coumadin with goal INR 2.0-3.0. Home coumadin dose: 2.5 mg PO daily, except 3.75 mg on Sundays and Thursdays. INR checks on Tues, Thurs & Sat.   Hyperlipidemia Aortic atherosclerosis Continue statin and fenofibrate   CKD stage IIIb Creatinine around baseline now of 1.1   Weakness/debility/deconditioning/gait disturbance: Generalized weakness prior to arrival.  Resides at home prior to hospitalization.  Now PT/OT is recommending SNF therefore arrangements be made by TOC.     DVT prophylaxis: Coumadin Code Status: DNR  Family Communication:   Status is: Inpatient Remains inpatient appropriate because: Medically stable, awaiting SNF placement

## 2023-03-19 NOTE — Progress Notes (Signed)
Progress Note   Patient: Justin Gallegos DOB: 1929/05/20 DOA: 03/16/2023     2 DOS: the patient was seen and examined on 03/19/2023   Brief hospital course: 89 y.o. male with past medical history significant for chronic diastolic congestive heart failure (LVEF 55-60%), HTN, HLD, permanent atrial fibrillation on warfarin, history of TIA, chronic low back pain, hemorrhoids who presented to [facility] ED on 8/5 via EMS from home with progressive weakness, confusion; ongoing x 1 week.  At baseline, able to ambulate at home however was too weak to stand when attempting to transfer to his bedside commode.  On EMS arrival, patient was found to be in atrial fibrillation with intermittent tachycardia.  He was given 300 cc of IV fluids and brought to the ED for further evaluation.   In the ED, temperature 97.8 F, HR 104, RR 18, BP 134/99, SpO2 97% on room air.  WBC 9.8, hemoglobin 14.1, platelet count 194.  Sodium 125, potassium 4.2, chloride 97, CO2 17, glucose 114, BUN 15, creatinine 1.48.  Magnesium 1.6.  AST 27, ALT 20, total bilirubin 1.0.  High sensitive troponin 13.  Lactic acid 1.8.  CT head obtained which did not show any acute intracranial process, noted remote right cerebral infraction. CT chest stand abdomen pelvis showed cardiomegaly, coronary disease, aortic atherosclerosis.  Bibasilar atelectasis.  Colonic diverticulosis.  Prostate enlargement.  Bladder wall thickening and trabeculation with severe diverticular compatible of chronic bladder outlet obstruction.  No acute finding. Patient received LR 500 bolus.  Magnesium 2 g and started on NS 75 cc/h. Called urology Dr.Machen regarding the bladder obstruction and BPH with recommendation for foley and start Flomax.   TRH consulted for admission for further evaluation and management of acute metabolic encephalopathy, hyponatremia.  Assessment and Plan: Hyponatremia Hypochloremia Patient presenting with sodium 125, chloride 97.   Complicated by home Lasix use.  CT head unremarkable except right cerebral hemisphere remote infarction.  Urine sodium 79.  Urine SG 1.017 within normal limits.  Etiology likely secondary to DHI hydration, poor oral intake as well as home Lasix use. -- Na overall improved on IVF - cont to follow Na trends   Hypocalcemia Serum calcium 8.3, ionized calcium 1.10 on admission. S/p Calcium gluconate 1 g IV 8/5 -- corrected calcium currently 9.02   Hypomagnesemia Magnesium 1.7, will continue to replete. -- replaced   Acute metabolic encephalopathy: Improving Etiology likely secondary to poor oral intake, electrolyte disturbance.  Patient is afebrile without leukocytosis.  CT head without contrast with no acute intracranial hemorrhage or infarct, progressive parenchymal volume loss, remote right cerebellar hemisphere infarct, which is new since prior examination.  CT chest/abdomen/pelvis with no acute findings.  -- Continue supportive care   Chronic bladder outlet obstruction BPH CT Abdomen/pelvis with bladder wall thickening, trabeculation with severe diverticula compatible with chronic bladder outlet obstruction and prostate enlargement.  No previous history of BPH in the past.  Admitting physician discussed with urology, Dr. Lafonda Mosses; recommended Foley catheter placement and initiation of Flomax. --Continue Foley catheter -- Flomax 0.4 mg p.o. daily -- 1350cc urine output over past 24h   Chronic/permanent atrial fibrillation on anticoagulation Essential hypertension Chronic diastolic congestive heart failure, compensated TTE 04/10/2020 with LVEF 55 to 60%, moderate LVH, no LV regional wall motion abnormalities, IVC normal in size, no aortic stenosis. -- Metoprolol succinate 25 mg p.o. daily -- Holding home furosemide as above -- Cont coumadin with pharmacy to dose -- Cont to follow INR, currently 2.6   Hyperlipidemia  Aortic atherosclerosis -- Atorvastatin -- Fenofibrate   CKD stage  IIIb Creatinine baseline 1.5-1.8.  Creatinine 1.60 on admission, at baseline. -- Cr improved to below baseline -Cont to follow bmet trends   Weakness/debility/deconditioning/gait disturbance: Patient resides at home. -- PT/OT recs noted for SNF      Subjective: Without complaints this AM. Reports tolerating breakfast well  Physical Exam: Vitals:   03/19/23 0457 03/19/23 0538 03/19/23 0831 03/19/23 1543  BP: (!) 158/116 (!) 166/106 124/72 (!) 148/90  Pulse: (!) 103 96 98 87  Resp: 18     Temp: 98 F (36.7 C)   97.7 F (36.5 C)  TempSrc: Oral   Oral  SpO2: 95%   100%  Weight:      Height:       General exam: Awake, laying in bed, in nad Respiratory system: Normal respiratory effort, no wheezing Cardiovascular system: regular rate, s1, s2 Gastrointestinal system: Soft, nondistended, positive BS Central nervous system: CN2-12 grossly intact, strength intact Extremities: Perfused, no clubbing Skin: Normal skin turgor, no notable skin lesions seen Psychiatry: Mood normal // no visual hallucinations   Data Reviewed:  Labs reviewed: Na 127, K 4.1, Cr 1.13, Hgb 12.9  Family Communication: Pt in room, family not at bedside  Disposition: Status is: Inpatient Remains inpatient appropriate because: Severity of illness  Planned Discharge Destination: Skilled nursing facility    Author: Rickey Barbara, MD 03/19/2023 3:51 PM  For on call review [email or web address].

## 2023-03-19 NOTE — NC FL2 (Signed)
MEDICAID FL2 LEVEL OF CARE FORM     IDENTIFICATION  Patient Name: Justin Gallegos Birthdate: 04-18-1929 Sex: male Admission Date (Current Location): 03/16/2023  [facility] and [place] Number:  Producer, television/film/video and Address:  [facility],  [address] [place], [place] [address]      Provider Number: [id number]  Attending Physician Name and Address:  Jerald Kief, MD  Relative Name and Phone Number:  [facility]) [phone]    Current Level of Care: Hospital Recommended Level of Care: Skilled Nursing Facility Prior Approval Number:    Date Approved/Denied:   PASRR Number: [id number]  Discharge Plan: SNF    Current Diagnoses: Patient Active Problem List   Diagnosis Date Noted   Malnutrition of moderate degree 03/18/2023   Hyponatremia 03/17/2023   Hypomagnesemia 03/17/2023   Acute metabolic encephalopathy 03/17/2023   BPH (benign prostatic hyperplasia) 03/17/2023   Atherosclerosis 03/17/2023   Chronic diastolic CHF (congestive heart failure) (HCC) 03/17/2023   Atrial fibrillation, chronic (HCC) 03/17/2023   Mild bibasilar atelectasis 03/17/2023   Metabolic acidosis 03/17/2023   Bilateral leg edema 01/28/2022   Stasis dermatitis of both legs 01/28/2022   Ankle edema, bilateral 02/09/2020   B12 deficiency 09/01/2018   Long term (current) use of anticoagulants 07/16/2017   Branch retinal vein occlusion of left eye 10/14/2016   Rectal itching 04/05/2014   Tinea cruris 04/05/2014   Encounter for therapeutic drug monitoring 09/27/2013   Carotid bruit 07/21/2013   Infected sebaceous cyst of skin 02/19/2011   Chronic anticoagulation 02/19/2011   Transient cerebral ischemia 07/31/2010   ACUTE BRONCHITIS 05/29/2010   TUBULOVILLOUS ADENOMA, COLON, HX OF 05/17/2010   Disorder resulting from impaired renal function 10/24/2009   PERSONAL HX MALIG NEOPLASM OTH URINARY ORGAN 03/02/2009   ALLERGIC RHINITIS 11/03/2008   LOW BACK PAIN 04/19/2008    Hyperlipidemia 06/11/2007   Essential hypertension 06/11/2007   OSTEOARTHRITIS 06/11/2007   CHICKENPOX, HX OF 06/11/2007   Atrial fibrillation (HCC) 03/13/2007    Orientation RESPIRATION BLADDER Height & Weight     Self, Time, Situation, Place  Normal Continent Weight: 86 kg Height:  6' (182.9 cm)  BEHAVIORAL SYMPTOMS/MOOD NEUROLOGICAL BOWEL NUTRITION STATUS      Continent Diet (Dysphagia 3)  AMBULATORY STATUS COMMUNICATION OF NEEDS Skin   Limited Assist Verbally Normal                       Personal Care Assistance Level of Assistance  Bathing, Feeding, Dressing Bathing Assistance: Limited assistance Feeding assistance: Limited assistance Dressing Assistance: Limited assistance     Functional Limitations Info  Sight, Hearing, Speech Sight Info: Impaired (eyeglasses) Hearing Info: Impaired Speech Info: Impaired    SPECIAL CARE FACTORS FREQUENCY  PT (By licensed PT), OT (By licensed OT)     PT Frequency: 5x/wk OT Frequency: 5x/wk            Contractures Contractures Info: Not present    Additional Factors Info  Code Status, Allergies Code Status Info: DNR Allergies Info: Penicillins, Amoxicillin-pot Clavulanate           Current Medications (03/19/2023):  This is the current hospital active medication list Current Facility-Administered Medications  Medication Dose Route Frequency Provider Last Rate Last Admin   0.9 %  sodium chloride infusion  250 mL Intravenous PRN Sundil, Subrina, MD       0.9 %  sodium chloride infusion   Intravenous Continuous [place], Alvira Philips, DO 125 mL/hr at  03/19/23 1408 New Bag at 03/19/23 1408   acetaminophen (TYLENOL) tablet 650 mg  650 mg Oral Q6H PRN Tereasa Coop, MD       Or   acetaminophen (TYLENOL) suppository 650 mg  650 mg Rectal Q6H PRN Janalyn Shy, Subrina, MD       atorvastatin (LIPITOR) tablet 40 mg  40 mg Oral Daily Sundil, Subrina, MD   40 mg at 03/19/23 0913   Chlorhexidine Gluconate Cloth 2 % PADS 6 each  6 each  Topical Daily [place], Eric J, DO   6 each at 03/19/23 1206   ezetimibe (ZETIA) tablet 10 mg  10 mg Oral Daily Sundil, Subrina, MD   10 mg at 03/19/23 0913   feeding supplement (ENSURE ENLIVE / ENSURE PLUS) liquid 237 mL  237 mL Oral TID BM [place], Eric J, DO   237 mL at 03/19/23 1950   fenofibrate tablet 54 mg  54 mg Oral Daily Sundil, Subrina, MD   54 mg at 03/19/23 0915   hydrALAZINE (APRESOLINE) tablet 25 mg  25 mg Oral Q6H PRN [place], Eric J, DO   25 mg at 03/19/23 0544   metoprolol succinate (TOPROL-XL) 24 hr tablet 25 mg  25 mg Oral Daily Sundil, Subrina, MD   25 mg at 03/19/23 0913   multivitamin with minerals tablet 1 tablet  1 tablet Oral Daily [place], Eric J, DO   1 tablet at 03/19/23 0913   ondansetron (ZOFRAN) tablet 4 mg  4 mg Oral Q6H PRN Janalyn Shy, Subrina, MD       Or   ondansetron [facility]) injection 4 mg  4 mg Intravenous Q6H PRN Janalyn Shy, Subrina, MD       Oral care mouth rinse  15 mL Mouth Rinse 4 times per day [place], Eric J, DO   15 mL at 03/19/23 1702   Oral care mouth rinse  15 mL Mouth Rinse PRN [place], Alvira Philips, DO       sodium chloride flush (NS) 0.9 % injection 3 mL  3 mL Intravenous Q12H Sundil, Subrina, MD   3 mL at 03/17/23 1338   sodium chloride flush (NS) 0.9 % injection 3 mL  3 mL Intravenous PRN Sundil, Subrina, MD   3 mL at 03/17/23 1338   tamsulosin (FLOMAX) capsule 0.4 mg  0.4 mg Oral Daily Sundil, Subrina, MD   0.4 mg at 03/19/23 1610   Warfarin - Pharmacist Dosing Inpatient   Does not apply q1600 Donell Beers, [facility]         Discharge Medications: Please see discharge summary for a list of discharge medications.  Relevant Imaging Results:  Relevant Lab Results:   Additional Information SS#[id number] [phone], Olegario Messier, [place]

## 2023-03-20 DIAGNOSIS — E871 Hypo-osmolality and hyponatremia: Secondary | ICD-10-CM | POA: Diagnosis not present

## 2023-03-20 LAB — SODIUM, URINE, RANDOM: Sodium, Ur: 152 mmol/L

## 2023-03-20 LAB — OSMOLALITY, URINE: Osmolality, Ur: 630 mOsm/kg (ref 300–900)

## 2023-03-20 LAB — OSMOLALITY: Osmolality: 283 mOsm/kg (ref 275–295)

## 2023-03-20 MED ORDER — HALOPERIDOL LACTATE 5 MG/ML IJ SOLN
1.0000 mg | Freq: Once | INTRAMUSCULAR | Status: AC
  Administered 2023-03-20: 1 mg via INTRAVENOUS
  Filled 2023-03-20: qty 1

## 2023-03-20 MED ORDER — WARFARIN SODIUM 2.5 MG PO TABS
2.5000 mg | ORAL_TABLET | Freq: Once | ORAL | Status: AC
  Administered 2023-03-20: 2.5 mg via ORAL
  Filled 2023-03-20: qty 1

## 2023-03-20 MED ORDER — CARBAMIDE PEROXIDE 6.5 % OT SOLN
5.0000 [drp] | Freq: Two times a day (BID) | OTIC | Status: DC
  Administered 2023-03-20 – 2023-03-27 (×15): 5 [drp] via OTIC
  Filled 2023-03-20: qty 15

## 2023-03-20 MED ORDER — MELATONIN 5 MG PO TABS
5.0000 mg | ORAL_TABLET | Freq: Every evening | ORAL | Status: DC | PRN
  Administered 2023-03-20: 5 mg via ORAL
  Filled 2023-03-20: qty 1

## 2023-03-20 NOTE — Progress Notes (Signed)
Progress Note   Patient: Justin Gallegos:[id number] DOB: 1928/12/16 DOA: 03/16/2023     3 DOS: the patient was seen and examined on 03/20/2023   Brief hospital course: 89 y.o. male with past medical history significant for chronic diastolic congestive heart failure (LVEF 55-60%), HTN, HLD, permanent atrial fibrillation on warfarin, history of TIA, chronic low back pain, hemorrhoids who presented to [facility] ED on 8/5 via EMS from home with progressive weakness, confusion; ongoing x 1 week.  At baseline, able to ambulate at home however was too weak to stand when attempting to transfer to his bedside commode.  On EMS arrival, patient was found to be in atrial fibrillation with intermittent tachycardia.  He was given 300 cc of IV fluids and brought to the ED for further evaluation.   In the ED, temperature 97.8 F, HR 104, RR 18, BP 134/99, SpO2 97% on room air.  WBC 9.8, hemoglobin 14.1, platelet count 194.  Sodium 125, potassium 4.2, chloride 97, CO2 17, glucose 114, BUN 15, creatinine 1.48.  Magnesium 1.6.  AST 27, ALT 20, total bilirubin 1.0.  High sensitive troponin 13.  Lactic acid 1.8.  CT head obtained which did not show any acute intracranial process, noted remote right cerebral infraction. CT chest stand abdomen pelvis showed cardiomegaly, coronary disease, aortic atherosclerosis.  Bibasilar atelectasis.  Colonic diverticulosis.  Prostate enlargement.  Bladder wall thickening and trabeculation with severe diverticular compatible of chronic bladder outlet obstruction.  No acute finding. Patient received LR 500 bolus.  Magnesium 2 g and started on NS 75 cc/h. Called urology Dr.Machen regarding the bladder obstruction and BPH with recommendation for foley and start Flomax.   TRH consulted for admission for further evaluation and management of acute metabolic encephalopathy, hyponatremia.  Assessment and Plan: Hyponatremia Hypochloremia Patient presenting with sodium 125, chloride 97.   Complicated by home Lasix use.  CT head unremarkable except right cerebral hemisphere remote infarction.  Urine sodium 79.  Urine SG 1.017 within normal limits.  Etiology likely secondary to DHI hydration, poor oral intake as well as home Lasix use. -- Na now improving with IVF, tolerating thus far   Hypocalcemia Serum calcium 8.3, ionized calcium 1.10 on admission. S/p Calcium gluconate 1 g IV 8/5 -- corrected calcium unremarkable   Hypomagnesemia -replaced   Acute metabolic encephalopathy with underlying dementia: Improving Etiology likely secondary to poor oral intake, electrolyte disturbance.  Patient is afebrile without leukocytosis.  CT head without contrast with no acute intracranial hemorrhage or infarct, progressive parenchymal volume loss, remote right cerebellar hemisphere infarct, which is new since prior examination.  CT chest/abdomen/pelvis with no acute findings.  -- Pt does have hx dementia. Daughter in room has observed sharp decline recently. Daughter is realistic about pt's expected decline and is open to discussing GOC with Palliative Care -Have consulted Palliative Care   Chronic bladder outlet obstruction BPH CT Abdomen/pelvis with bladder wall thickening, trabeculation with severe diverticula compatible with chronic bladder outlet obstruction and prostate enlargement.  No previous history of BPH in the past.  Admitting physician discussed with urology, Dr. Lafonda Mosses; recommended Foley catheter placement and initiation of Flomax. --Continue Foley catheter -- Flomax 0.4 mg p.o. daily -- total 1600cc urine output over past 24h   Chronic/permanent atrial fibrillation on anticoagulation Essential hypertension Chronic diastolic congestive heart failure, compensated TTE 04/10/2020 with LVEF 55 to 60%, moderate LVH, no LV regional wall motion abnormalities, IVC normal in size, no aortic stenosis. -- Metoprolol succinate 25 mg p.o. daily --  Holding home furosemide as above --  Cont coumadin with pharmacy to dose -- Cont to follow INR, currently 2.5   Hyperlipidemia Aortic atherosclerosis -- Atorvastatin -- Fenofibrate   CKD stage IIIb Creatinine baseline 1.5-1.8.  Creatinine 1.60 on admission, at baseline. -- Cr improved to below baseline -Cont to follow bmet trends   Weakness/debility/deconditioning/gait disturbance: Patient resides at home. -- PT/OT recs noted for SNF      Subjective: Tired this AM without complaints  Physical Exam: Vitals:   03/19/23 1543 03/19/23 2042 03/20/23 0616 03/20/23 1300  BP: (!) 148/90 (!) 161/98 (!) 147/95 139/82  Pulse: 87 (!) 107 (!) 105 (!) 106  Resp:  18 20   Temp: 97.7 F (36.5 C) 98.7 F (37.1 C) 97.6 F (36.4 C) 98 F (36.7 C)  TempSrc: Oral Oral Oral Oral  SpO2: 100% 100% 99% 98%  Weight:   91.2 kg   Height:       General exam: Conversant, in no acute distress Respiratory system: normal chest rise, clear, no audible wheezing Cardiovascular system: regular rhythm, s1-s2 Gastrointestinal system: Nondistended, nontender, pos BS Central nervous system: No seizures, no tremors Extremities: No cyanosis, no joint deformities Skin: No rashes, no pallor Psychiatry: Affect normal // no auditory hallucinations   Data Reviewed:  Labs reviewed: Na 130, K 4.2, Cr 1.10, WBC 7.0, Hgb 13.1  Family Communication: Pt in room, family at bedside  Disposition: Status is: Inpatient Remains inpatient appropriate because: Severity of illness  Planned Discharge Destination: Skilled nursing facility    Author: Rickey Barbara, MD 03/20/2023 5:47 PM  For on call review [email or web address].

## 2023-03-20 NOTE — Progress Notes (Addendum)
ANTICOAGULATION CONSULT NOTE  Pharmacy Consult for Warfarin Indication: atrial fibrillation  Allergies  Allergen Reactions   Penicillins     Unable to breathe    Amoxicillin-Pot Clavulanate     REACTION: throat swelling    Patient Measurements: Height: 6' (182.9 cm) Weight: 91.2 kg (201 lb) IBW/kg (Calculated) : 77.6    Vital Signs: Temp: 98 F (36.7 C) (08/08 1300) Temp Source: Oral (08/08 1300) BP: 139/82 (08/08 1300) Pulse Rate: 106 (08/08 1300)  Labs: Recent Labs    03/18/23 0517 03/18/23 1103 03/18/23 2251 03/19/23 0453 03/20/23 1039  HGB 12.8*  --   --  12.9* 13.1  HCT 36.7*  --   --  38.2* 38.4*  PLT 158  --   --  153 181  LABPROT 31.7*  --   --  28.4* 27.0*  INR 3.0*  --   --  2.6* 2.5*  CREATININE 1.06   < > 1.13 1.13 1.10   < > = values in this interval not displayed.    Estimated Creatinine Clearance: 47 mL/min (by C-G formula based on SCr of 1.1 mg/dL).   Medical History: Past Medical History:  Diagnosis Date   Arthritis    Atrial fibrillation [facility])    sees Dr. Dietrich Pates    Cancer [facility])    ureteral, per Dr. Alberteen Sam disease    sees Dr. Stephannie Li    Hemorrhoids    Hyperlipidemia    Hypertension    Low back pain    TIA (transient ischemic attack)     Medications:  PTA Warfarin 2.5mg  daily except 3.75mg  Sun/Thur  Assessment:  89 yr male with generalized weakness and progressive confusion this past week.   CT Abdomen/pelvis with bladder wall thickening, trabeculation with severe diverticula compatible with chronic bladder outlet obstruction and prostate enlargement.  No previous history of BPH in the past.  PMH significant for HTN, AFib (on warfarin), colon cancer, TIA Patient reports LD of warfarin prior to admission = 03/16/23 INR on admission 8/5 = 3.6 (supratherapeutic) and 8/5 warfarin dose held  Today, 03/20/23  - INR = 2.5, therapeutic - Hgb= 13.1 g/dL, up - Plt: 433 K/uL, WNL - no reports of bleeding per RN  Goal  of Therapy:  INR 2-3 Monitor platelets by anticoagulation protocol: Yes   Plan:  - Repeat Warfarin 2.5 mg x 1 dose today  - Daily PT/INR - CBC daily x 3 days - Consider dose reduction at discharge.  Lynden Ang, PharmD, BCPS 03/20/2023,1:03 PM

## 2023-03-20 NOTE — Plan of Care (Signed)
  Problem: Clinical Measurements: Goal: Ability to maintain clinical measurements within normal limits will improve Outcome: Progressing   Problem: Activity: Goal: Risk for activity intolerance will decrease Outcome: Progressing   Problem: Nutrition: Goal: Adequate nutrition will be maintained Outcome: Progressing   Problem: Education: Goal: Knowledge of General Education information will improve Description: Including pain rating scale, medication(s)/side effects and non-pharmacologic comfort measures Outcome: Not Progressing   Problem: Health Behavior/Discharge Planning: Goal: Ability to manage health-related needs will improve Outcome: Not Progressing

## 2023-03-20 NOTE — Progress Notes (Signed)
    Patient Name: Justin Gallegos           DOB: Mar 17, 1929  MRN: [id number]      Admission Date: 03/16/2023  Attending Provider: Jerald Kief, MD  Primary Diagnosis: Hyponatremia   Level of care: Progressive    CROSS COVER NOTE   Date of Service   03/20/2023   Justin Gallegos, 89 y.o. male, was admitted on 03/16/2023 for Hyponatremia.    HPI/Events of Note   Acute metabolic encephalopathy  Agitated, restless, and confused Currently removing medical equipment such as Foley. Attempting to get out of bed multiple times.  High fall risk.   Unfortunately, he is not following commands despite multiple attempts at redirection by staff   Interventions/ Plan   Will trial low-dose IV Haldol for agitation.  Melatonin has also been ordered to assist with sleep cycle.        Anthoney Harada, DNP, ACNPC- AG [facility]

## 2023-03-20 NOTE — TOC Progression Note (Signed)
Transition of Care [facility]) - Progression Note    Patient Details  Name: Justin Gallegos MRN: [id number] Date of Birth: [birth year]  Transition of Care [facility]) CM/SW Contact  , Olegario Messier, RN Phone Number: 03/20/2023, 1:58 PM  Clinical Narrative:  Bed offers given await choice prior getting auth for facility & PTAR.     Expected Discharge Plan: Skilled Nursing Facility Barriers to Discharge: Continued Medical Work up  Expected Discharge Plan and Services   Discharge Planning Services: CM Consult   Living arrangements for the past 2 months: Single Family Home                                       Social Determinants of Health (SDOH) Interventions SDOH Screenings   Food Insecurity: No Food Insecurity (03/17/2023)  Housing: Low Risk  (03/17/2023)  Transportation Needs: No Transportation Needs (03/17/2023)  Utilities: Not At Risk (03/17/2023)  Alcohol Screen: Low Risk  (12/31/2022)  Depression (PHQ2-9): Low Risk  (12/31/2022)  Financial Resource Strain: Low Risk  (12/31/2022)  Physical Activity: Inactive (12/31/2022)  Social Connections: Moderately Integrated (12/31/2022)  Stress: No Stress Concern Present (12/31/2022)  Tobacco Use: Low Risk  (03/17/2023)    Readmission Risk Interventions     No data to display

## 2023-03-20 NOTE — Plan of Care (Signed)
  Problem: Clinical Measurements: Goal: Will remain free from infection Outcome: Progressing Goal: Cardiovascular complication will be avoided Outcome: Progressing   Problem: Nutrition: Goal: Adequate nutrition will be maintained Outcome: Progressing   Problem: Coping: Goal: Level of anxiety will decrease Outcome: Progressing   Problem: Safety: Goal: Ability to remain free from injury will improve Outcome: Progressing

## 2023-03-21 DIAGNOSIS — E871 Hypo-osmolality and hyponatremia: Secondary | ICD-10-CM | POA: Diagnosis not present

## 2023-03-21 DIAGNOSIS — R531 Weakness: Secondary | ICD-10-CM | POA: Diagnosis not present

## 2023-03-21 LAB — COMPREHENSIVE METABOLIC PANEL
ALT: 19 U/L (ref 0–44)
AST: 22 U/L (ref 15–41)
Albumin: 2.4 g/dL — ABNORMAL LOW (ref 3.5–5.0)
Alkaline Phosphatase: 56 U/L (ref 38–126)
Anion gap: 7 (ref 5–15)
BUN: 16 mg/dL (ref 8–23)
CO2: 17 mmol/L — ABNORMAL LOW (ref 22–32)
Calcium: 8.2 mg/dL — ABNORMAL LOW (ref 8.9–10.3)
Chloride: 106 mmol/L (ref 98–111)
Creatinine, Ser: 1.1 mg/dL (ref 0.61–1.24)
GFR, Estimated: >60 mL/min (ref >60)
Glucose, Bld: 91 mg/dL (ref 70–99)
Potassium: 4 mmol/L (ref 3.5–5.1)
Sodium: 130 mmol/L — ABNORMAL LOW (ref 135–145)
Total Bilirubin: 0.6 mg/dL (ref 0.3–1.2)
Total Protein: 5 g/dL — ABNORMAL LOW (ref 6.5–8.1)

## 2023-03-21 MED ORDER — WARFARIN SODIUM 2.5 MG PO TABS
2.5000 mg | ORAL_TABLET | Freq: Once | ORAL | Status: AC
  Administered 2023-03-21: 2.5 mg via ORAL
  Filled 2023-03-21: qty 1

## 2023-03-21 MED ORDER — ALBUMIN HUMAN 25 % IV SOLN
25.0000 g | Freq: Once | INTRAVENOUS | Status: AC
  Administered 2023-03-21: 25 g via INTRAVENOUS
  Filled 2023-03-21: qty 100

## 2023-03-21 MED ORDER — FUROSEMIDE 10 MG/ML IJ SOLN
40.0000 mg | Freq: Once | INTRAMUSCULAR | Status: AC
  Administered 2023-03-21: 40 mg via INTRAVENOUS
  Filled 2023-03-21: qty 4

## 2023-03-21 NOTE — Progress Notes (Addendum)
Physical Therapy Treatment Patient Details Name: Justin Gallegos MRN: [id number] DOB: [birth year] Today's Date: 03/21/2023   History of Present Illness 89 yr old male admitted with weakness and confusion. He was found to have hyponatremia and acute metabolic encephalopathy. PMH: HF, essential HTN, Afib, TIA, arthritis,  ureteral CA    PT Comments  General Comments: AxO x 2 VERY HOH requiring repeat VC's and simple commands to stay on task. Assisted OOB was difficult.  General bed mobility comments: required ModMax Assist esp to complete scooting to EOB. Pt appears edematous throughout.  Pt also present with wheezing dyspnea.  RA checked at 97%.  RR 30.  Required a seated rest break.  Assisted with amb required + 2 assist.  General transfer comment: pt required Max Assist + 2 to rise from elevated as well as increased assist to rise from lower BSC level.  Pt's Son stated, "he has a LIFT CHAIR" at home.  General Gait Details: VERY limited amb distance of 6 feet requiring + 2 Max Assist and Son following with recliner.  Distance limited by effort/fatigue. Returned to room in recliner and positioned to comfort.  Set up hid lunch tray and noted pt was unable to feed self.  Weak grip on fork and slow UE movement.  Son stated "Mom has to help him eat".   Pt will need ST Rehab at SNF to address mobility and functional decline prior to safely returning home.    If plan is discharge home, recommend the following: A little help with walking and/or transfers;A little help with bathing/dressing/bathroom;Assistance with cooking/housework;Assist for transportation;Help with stairs or ramp for entrance   Can travel by private vehicle     No  Equipment Recommendations  Rolling walker (2 wheels)    Recommendations for Other Services       Precautions / Restrictions Precautions Precautions: Fall Restrictions Weight Bearing Restrictions: No     Mobility  Bed Mobility Overal bed mobility: Needs  Assistance Bed Mobility: Supine to Sit     Supine to sit: Mod assist, Max assist     General bed mobility comments: required ModMax Assist esp to complete scooting to EOB.    Transfers Overall transfer level: Needs assistance Equipment used: Rolling walker (2 wheels) Transfers: Sit to/from Stand Sit to Stand: +2 physical assistance, +2 safety/equipment, Max assist, From elevated surface           General transfer comment: pt required Max Assist + 2 to rise from elevated as well as increased assist to rise from lower BSC level.  Pt's Son stated, "he has a LIFT CHAIR" at home.    Ambulation/Gait Ambulation/Gait assistance: Max assist, +2 physical assistance, +2 safety/equipment Gait Distance (Feet): 6 Feet Assistive device: Rolling walker (2 wheels) Gait Pattern/deviations: Step-to pattern Gait velocity: decreased     General Gait Details: VERY limited amb distance of 6 feet requiring + 2 Max Assist and Son following with recliner.  Distance limited by effort/fatigue.   Stairs             Wheelchair Mobility     Tilt Bed    Modified Rankin (Stroke Patients Only)       Balance                                            Cognition Arousal: Alert Behavior During Therapy: WFL for tasks assessed/performed Overall  Cognitive Status: Impaired/Different from baseline                                 General Comments: AxO x 2 VERY HOH requiring repeat VC's and simple commands to stay on task.        Exercises      General Comments        Pertinent Vitals/Pain Pain Assessment Pain Assessment: No/denies pain    Home Living                          Prior Function            PT Goals (current goals can now be found in the care plan section) Progress towards PT goals: Progressing toward goals    Frequency    Min 1X/week      PT Plan      Co-evaluation              AM-PAC PT "6 Clicks"  Mobility   Outcome Measure  Help needed turning from your back to your side while in a flat bed without using bedrails?: A Lot Help needed moving from lying on your back to sitting on the side of a flat bed without using bedrails?: A Lot Help needed moving to and from a bed to a chair (including a wheelchair)?: A Lot Help needed standing up from a chair using your arms (e.g., wheelchair or bedside chair)?: A Lot Help needed to walk in hospital room?: A Lot Help needed climbing 3-5 steps with a railing? : Total 6 Click Score: 11    End of Session Equipment Utilized During Treatment: Gait belt Activity Tolerance: Patient limited by fatigue Patient left: in chair;with call bell/phone within reach;with chair alarm set Nurse Communication: Mobility status PT Visit Diagnosis: Muscle weakness (generalized) (M62.81);Difficulty in walking, not elsewhere classified (R26.2)     Time: 1610-9604 PT Time Calculation (min) (ACUTE ONLY): 25 min  Charges:    $Gait Training: 8-22 mins $Therapeutic Activity: 8-22 mins PT General Charges $$ ACUTE PT VISIT: 1 Visit                     Felecia Shelling  PTA Acute  Rehabilitation Services Office M-F          [phone]

## 2023-03-21 NOTE — Progress Notes (Signed)
Progress Note   Patient: Justin Gallegos MVH:[id number] DOB: 11/21/1928 DOA: 03/16/2023     4 DOS: the patient was seen and examined on 03/21/2023   Brief hospital course: 89 y.o. male with past medical history significant for chronic diastolic congestive heart failure (LVEF 55-60%), HTN, HLD, permanent atrial fibrillation on warfarin, history of TIA, chronic low back pain, hemorrhoids who presented to [facility] ED on 8/5 via EMS from home with progressive weakness, confusion; ongoing x 1 week.  At baseline, able to ambulate at home however was too weak to stand when attempting to transfer to his bedside commode.  On EMS arrival, patient was found to be in atrial fibrillation with intermittent tachycardia.  He was given 300 cc of IV fluids and brought to the ED for further evaluation.   In the ED, temperature 97.8 F, HR 104, RR 18, BP 134/99, SpO2 97% on room air.  WBC 9.8, hemoglobin 14.1, platelet count 194.  Sodium 125, potassium 4.2, chloride 97, CO2 17, glucose 114, BUN 15, creatinine 1.48.  Magnesium 1.6.  AST 27, ALT 20, total bilirubin 1.0.  High sensitive troponin 13.  Lactic acid 1.8.  CT head obtained which did not show any acute intracranial process, noted remote right cerebral infraction. CT chest stand abdomen pelvis showed cardiomegaly, coronary disease, aortic atherosclerosis.  Bibasilar atelectasis.  Colonic diverticulosis.  Prostate enlargement.  Bladder wall thickening and trabeculation with severe diverticular compatible of chronic bladder outlet obstruction.  No acute finding. Patient received LR 500 bolus.  Magnesium 2 g and started on NS 75 cc/h. Called urology Dr.Machen regarding the bladder obstruction and BPH with recommendation for foley and start Flomax.   TRH consulted for admission for further evaluation and management of acute metabolic encephalopathy, hyponatremia.  Assessment and Plan: Hyponatremia Hypochloremia Patient presenting with sodium 125, chloride 97.   Complicated by home Lasix use.  CT head unremarkable except right cerebral hemisphere remote infarction.  Urine sodium 79.  Urine SG 1.017 within normal limits.  Etiology likely secondary to DHI hydration, poor oral intake as well as home Lasix use. -- Na now improved after IVF hydration, now clinically overloaded. Will hold further IVF -Recheck bmet in AM   Hypocalcemia Serum calcium 8.3, ionized calcium 1.10 on admission. S/p Calcium gluconate 1 g IV 8/5 -- corrected calcium unremarkable   Hypomagnesemia -replaced and within normal limits   Acute metabolic encephalopathy with underlying dementia: Improving Etiology likely secondary to poor oral intake, electrolyte disturbance.  Patient is afebrile without leukocytosis.  CT head without contrast with no acute intracranial hemorrhage or infarct, progressive parenchymal volume loss, remote right cerebellar hemisphere infarct, which is new since prior examination.  CT chest/abdomen/pelvis with no acute findings.  -- Pt does have hx dementia. Daughter in room has observed sharp decline recently. Daughter is realistic about pt's expected decline and is open to discussing GOC with Palliative Care -Palliative Care consulted   Chronic bladder outlet obstruction BPH CT Abdomen/pelvis with bladder wall thickening, trabeculation with severe diverticula compatible with chronic bladder outlet obstruction and prostate enlargement.  No previous history of BPH in the past.  Admitting physician discussed with urology, Dr. Lafonda Mosses; recommended Foley catheter placement and initiation of Flomax. --Continue Foley catheter -- Flomax 0.4 mg p.o. daily -- total 1600cc urine output over past 24h   Chronic/permanent atrial fibrillation on anticoagulation Essential hypertension Acutely decompensated Chronic diastolic congestive heart failure TTE 04/10/2020 with LVEF 55 to 60%, moderate LVH, no LV regional wall motion abnormalities, IVC  normal in size, no aortic  stenosis. -- Metoprolol succinate 25 mg p.o. daily -- Cont coumadin with pharmacy to dose -- Cont to follow INR, currently 2.5 -Now clinically volume overloaded. Held further IVF. Giving dose of IV albumin. Pt with audible wheezing and LE edema. Will give one dose of IV lasix   Hyperlipidemia Aortic atherosclerosis -- Atorvastatin -- Fenofibrate   CKD stage IIIb Creatinine baseline 1.5-1.8.  Creatinine 1.60 on admission, at baseline. -- Cr improved to below baseline -Cont to follow bmet trends   Weakness/debility/deconditioning/gait disturbance: Patient resides at home. -- PT/OT recs noted for SNF      Subjective: Without compliants today. Seen at bedside with family eating lunch  Physical Exam: Vitals:   03/20/23 1957 03/21/23 0401 03/21/23 1133 03/21/23 1355  BP: (!) 156/88 (!) 154/98 (!) 153/100 (!) 154/98  Pulse: 96 99 (!) 114 98  Resp: 20 20    Temp: 98.2 F (36.8 C) 98.3 F (36.8 C) 98 F (36.7 C) 98.5 F (36.9 C)  TempSrc: Oral Oral Oral Oral  SpO2: 100% 98% 98% 98%  Weight:  93.4 kg    Height:       General exam: Awake, laying in bed, in nad Respiratory system: Increased resp effort, audible breath sounds Cardiovascular system: regular rate, s1, s2 Gastrointestinal system: Soft, nondistended, positive BS Central nervous system: CN2-12 grossly intact, strength intact Extremities: Perfused, no clubbing, BLE edema Skin: Normal skin turgor, no notable skin lesions seen Psychiatry: Mood normal // no visual hallucinations   Data Reviewed:  Labs reviewed: Na 130, K 4.0, Cr 1.10, WBC 5.8, Hgb 12.2, Plts 184, INR 2.5  Family Communication: Pt in room, family at bedside  Disposition: Status is: Inpatient Remains inpatient appropriate because: Severity of illness  Planned Discharge Destination: Skilled nursing facility    Author: Rickey Barbara, MD 03/21/2023 4:39 PM  For on call review [email or web address].

## 2023-03-21 NOTE — Progress Notes (Signed)
1130 Loss of IV access. Order placed for IV team consult to place new PIV.

## 2023-03-21 NOTE — TOC Progression Note (Signed)
Transition of Care [facility]) - Progression Note    Patient Details  Name: Justin Gallegos MRN: [id number] Date of Birth: 01-14-1929  Transition of [facility]) CM/SW Contact  Howell Rucks, RN Phone Number: 03/21/2023, 4:47 PM  Clinical Narrative:   Met with pt's dtr at bedside, confirmed accepted bed offer at [facility]. Expected date of discharge 8/13, will initiate auth on Monday 8/12.  TOC will continue to follow.    Expected Discharge Plan: Skilled Nursing Facility Barriers to Discharge: Continued Medical Work up  Expected Discharge Plan and Services   Discharge Planning Services: CM Consult   Living arrangements for the past 2 months: Single Family Home                                       Social Determinants of Health (SDOH) Interventions SDOH Screenings   Food Insecurity: No Food Insecurity (03/17/2023)  Housing: Low Risk  (03/17/2023)  Transportation Needs: No Transportation Needs (03/17/2023)  Utilities: Not At Risk (03/17/2023)  Alcohol Screen: Low Risk  (12/31/2022)  Depression (PHQ2-9): Low Risk  (12/31/2022)  Financial Resource Strain: Low Risk  (12/31/2022)  Physical Activity: Inactive (12/31/2022)  Social Connections: Moderately Integrated (12/31/2022)  Stress: No Stress Concern Present (12/31/2022)  Tobacco Use: Low Risk  (03/17/2023)    Readmission Risk Interventions     No data to display

## 2023-03-21 NOTE — Progress Notes (Signed)
SLP Cancellation Note  Patient Details Name: Justin Gallegos MRN: [id number] DOB: 02-Mar-1929   Cancelled treatment:       Reason Eval/Treat Not Completed: Other (comment) (RN reports pt's intake is very poor - palliative referral pending)  Rolena Infante, MS [facility] SLP Acute Rehab Services Office [phone]  Chales Abrahams 03/21/2023, 3:31 PM

## 2023-03-21 NOTE — Progress Notes (Signed)
ANTICOAGULATION CONSULT NOTE  Pharmacy Consult for Warfarin Indication: atrial fibrillation  Allergies  Allergen Reactions   Penicillins     Unable to breathe    Amoxicillin-Pot Clavulanate     REACTION: throat swelling    Patient Measurements: Height: 6' (182.9 cm) Weight: 93.4 kg (206 lb) IBW/kg (Calculated) : 77.6    Vital Signs: Temp: 98.3 F (36.8 C) (08/09 0401) Temp Source: Oral (08/09 0401) BP: 154/98 (08/09 0401) Pulse Rate: 99 (08/09 0401)  Labs: Recent Labs    03/18/23 2251 03/19/23 0453 03/19/23 0453 03/20/23 1039 03/21/23 0453  HGB  --  12.9*   < > 13.1 12.2*  HCT  --  38.2*  --  38.4* 36.3*  PLT  --  153  --  181 184  LABPROT  --  28.4*  --  27.0* 26.8*  INR  --  2.6*  --  2.5* 2.5*  CREATININE 1.13 1.13  --  1.10  --    < > = values in this interval not displayed.    Estimated Creatinine Clearance: 50.8 mL/min (by C-G formula based on SCr of 1.1 mg/dL).   Medical History: Past Medical History:  Diagnosis Date   Arthritis    Atrial fibrillation [facility])    sees Dr. Dietrich Pates    Cancer [facility])    ureteral, per Dr. Alberteen Sam disease    sees Dr. Stephannie Li    Hemorrhoids    Hyperlipidemia    Hypertension    Low back pain    TIA (transient ischemic attack)     Medications:  PTA Warfarin 2.5mg  daily except 3.75mg  Sun/Thur  Assessment:  89 yr male with generalized weakness and progressive confusion this past week.   CT Abdomen/pelvis with bladder wall thickening, trabeculation with severe diverticula compatible with chronic bladder outlet obstruction and prostate enlargement.  No previous history of BPH in the past.  PMH significant for HTN, AFib (on warfarin), colon cancer, TIA Patient reports LD of warfarin prior to admission = 03/16/23 INR on admission 8/5 = 3.6 (supratherapeutic) and 8/5 warfarin dose held  Today, 03/21/23  - INR = 2.5, therapeutic - Hgb= 12.2 g/dL, slightly low - Plt: 161 K/uL, WNL - no reports of bleeding per  RN  Goal of Therapy:  INR 2-3 Monitor platelets by anticoagulation protocol: Yes   Plan:  - Repeat Warfarin 2.5 mg x 1 dose today  - Daily PT/INR - CBC daily x 2 days - Consider dose reduction at discharge.  Josefa Half, PharmD, BCPS 03/21/2023,9:12 AM

## 2023-03-22 ENCOUNTER — Inpatient Hospital Stay (HOSPITAL_COMMUNITY): Payer: PPO

## 2023-03-22 DIAGNOSIS — E871 Hypo-osmolality and hyponatremia: Secondary | ICD-10-CM | POA: Diagnosis not present

## 2023-03-22 DIAGNOSIS — R531 Weakness: Secondary | ICD-10-CM | POA: Diagnosis not present

## 2023-03-22 LAB — BRAIN NATRIURETIC PEPTIDE: B Natriuretic Peptide: 143.7 pg/mL — ABNORMAL HIGH (ref 0.0–100.0)

## 2023-03-22 MED ORDER — FUROSEMIDE 10 MG/ML IJ SOLN
40.0000 mg | Freq: Once | INTRAMUSCULAR | Status: AC
  Administered 2023-03-22: 40 mg via INTRAVENOUS
  Filled 2023-03-22: qty 4

## 2023-03-22 MED ORDER — WARFARIN SODIUM 2.5 MG PO TABS
2.5000 mg | ORAL_TABLET | Freq: Once | ORAL | Status: AC
  Administered 2023-03-22: 2.5 mg via ORAL
  Filled 2023-03-22: qty 1

## 2023-03-22 MED ORDER — METOPROLOL SUCCINATE ER 50 MG PO TB24
50.0000 mg | ORAL_TABLET | Freq: Every day | ORAL | Status: DC
  Administered 2023-03-22 – 2023-03-27 (×6): 50 mg via ORAL
  Filled 2023-03-22 (×6): qty 1

## 2023-03-22 NOTE — Plan of Care (Signed)
  Problem: Education: Goal: Knowledge of General Education information will improve Description: Including pain rating scale, medication(s)/side effects and non-pharmacologic comfort measures 03/22/2023 0249 by Charmian Muff, RN Outcome: Progressing 03/22/2023 0249 by Charmian Muff, RN Outcome: Progressing   Problem: Health Behavior/Discharge Planning: Goal: Ability to manage health-related needs will improve Outcome: Progressing   Problem: Clinical Measurements: Goal: Ability to maintain clinical measurements within normal limits will improve Outcome: Progressing

## 2023-03-22 NOTE — Progress Notes (Signed)
Progress Note   Patient: Justin Gallegos NGE:[id number] DOB: 1929-05-27 DOA: 03/16/2023     5 DOS: the patient was seen and examined on 03/22/2023   Brief hospital course: 89 y.o. male with past medical history significant for chronic diastolic congestive heart failure (LVEF 55-60%), HTN, HLD, permanent atrial fibrillation on warfarin, history of TIA, chronic low back pain, hemorrhoids who presented to [facility] ED on 8/5 via EMS from home with progressive weakness, confusion; ongoing x 1 week.  At baseline, able to ambulate at home however was too weak to stand when attempting to transfer to his bedside commode.  On EMS arrival, patient was found to be in atrial fibrillation with intermittent tachycardia.  He was given 300 cc of IV fluids and brought to the ED for further evaluation.   In the ED, temperature 97.8 F, HR 104, RR 18, BP 134/99, SpO2 97% on room air.  WBC 9.8, hemoglobin 14.1, platelet count 194.  Sodium 125, potassium 4.2, chloride 97, CO2 17, glucose 114, BUN 15, creatinine 1.48.  Magnesium 1.6.  AST 27, ALT 20, total bilirubin 1.0.  High sensitive troponin 13.  Lactic acid 1.8.  CT head obtained which did not show any acute intracranial process, noted remote right cerebral infraction. CT chest stand abdomen pelvis showed cardiomegaly, coronary disease, aortic atherosclerosis.  Bibasilar atelectasis.  Colonic diverticulosis.  Prostate enlargement.  Bladder wall thickening and trabeculation with severe diverticular compatible of chronic bladder outlet obstruction.  No acute finding. Patient received LR 500 bolus.  Magnesium 2 g and started on NS 75 cc/h. Called urology Dr.Machen regarding the bladder obstruction and BPH with recommendation for foley and start Flomax.   TRH consulted for admission for further evaluation and management of acute metabolic encephalopathy, hyponatremia.  Assessment and Plan: Hyponatremia Hypochloremia Patient presenting with sodium 125, chloride  97.  Complicated by home Lasix use.  CT head unremarkable except right cerebral hemisphere remote infarction.  Urine sodium 79.  Urine SG 1.017 within normal limits.  Etiology likely secondary to DHI hydration, poor oral intake as well as home Lasix use. -- Na now improved after IVF hydration, now clinically overloaded. Holding further IVF -Recheck bmet in AM   Hypocalcemia Serum calcium 8.3, ionized calcium 1.10 on admission. S/p Calcium gluconate 1 g IV 8/5 -- corrected calcium unremarkable   Hypomagnesemia -replaced and within normal limits   Acute metabolic encephalopathy with underlying dementia: Improving Etiology likely secondary to poor oral intake, electrolyte disturbance.  Patient is afebrile without leukocytosis.  CT head without contrast with no acute intracranial hemorrhage or infarct, progressive parenchymal volume loss, remote right cerebellar hemisphere infarct, which is new since prior examination.  CT chest/abdomen/pelvis with no acute findings.  -- Pt does have hx dementia. Daughter in room has observed sharp decline recently. Daughter is realistic about pt's expected decline and is open to discussing GOC with Palliative Care -Palliative Care following   Chronic bladder outlet obstruction BPH CT Abdomen/pelvis with bladder wall thickening, trabeculation with severe diverticula compatible with chronic bladder outlet obstruction and prostate enlargement.  No previous history of BPH in the past.  Admitting physician discussed with urology, Dr. Lafonda Mosses; recommended Foley catheter placement and initiation of Flomax. --Continue Foley catheter -- Flomax 0.4 mg p.o. daily -- total 4.3L urine output over past 24h   Chronic/permanent atrial fibrillation on anticoagulation Essential hypertension Acutely decompensated Chronic diastolic congestive heart failure TTE 04/10/2020 with LVEF 55 to 60%, moderate LVH, no LV regional wall motion abnormalities, IVC normal  in size, no aortic  stenosis. -- Metoprolol succinate 25 mg p.o. daily -- Cont coumadin with pharmacy to dose -- Cont to follow INR, currently 2.5 -Now clinically volume overloaded. Held further IVF. Giving dose of IV albumin. Pt with audible wheezing and LE edema. Continue IV lasix for now   Hyperlipidemia Aortic atherosclerosis -- Atorvastatin -- Fenofibrate   CKD stage IIIb Creatinine baseline 1.5-1.8.  Creatinine 1.60 on admission, at baseline. -- Cr improved to below baseline -Cont to follow bmet trends   Weakness/debility/deconditioning/gait disturbance: Patient resides at home. -- PT/OT recs noted for SNF      Subjective: More tired overall. Still having increased wob. Voiding very well after lasix  Physical Exam: Vitals:   03/22/23 0409 03/22/23 0458 03/22/23 1032 03/22/23 1230  BP: (!) 155/105 (!) 145/97 (!) 156/109   Pulse:  (!) 109 (!) 115   Resp: 18  (!) 22   Temp: (!) 97.5 F (36.4 C)  98 F (36.7 C)   TempSrc: Oral  Oral   SpO2: 97%  99%   Weight:    90 kg  Height:       General exam: Conversant, in no acute distress Respiratory system: normal chest rise, clear, audible wheezing Cardiovascular system: regular rhythm, s1-s2 Gastrointestinal system: Nondistended, nontender, pos BS Central nervous system: No seizures, no tremors Extremities: No cyanosis, no joint deformities Skin: No rashes, no pallor Psychiatry: Affect normal // no auditory hallucinations   Data Reviewed:  Labs reviewed: Na 130, K 3.7, Cr 1.22, alb 2.9  Family Communication: Pt in room, family at bedside  Disposition: Status is: Inpatient Remains inpatient appropriate because: Severity of illness  Planned Discharge Destination: Skilled nursing facility    Author: Rickey Barbara, MD 03/22/2023 3:26 PM  For on call review [email or web address].

## 2023-03-22 NOTE — Plan of Care (Signed)

## 2023-03-22 NOTE — Consult Note (Signed)
Consultation Note Date: 03/22/2023   Patient Name: Justin Gallegos  DOB: [birth year]  MRN: [id number]  Age / Sex: 89 y.o., male  PCP: Nelwyn Salisbury, MD Referring Physician: Jerald Kief, MD  Reason for Consultation: Establishing goals of care  HPI/Patient Profile: 89 y.o. male admitted on 03/16/2023 89 year old gentleman who lives at home with his wife and is admitted to hospital medicine service for electrolyte abnormalities, acute metabolic encephalopathy likely stemming from underlying dementia, chronic bladder outlet obstruction with history of BPH, chronic A-fib with acute/decompensated chronic diastolic congestive heart failure.  Patient also has underlying stage IIIb chronic kidney disease dyslipidemia aortic atherosclerosis.  Additionally, he has a had a rather subacute functional status decline with ongoing weakness debility deconditioning and gait disturbance.  Clinical Assessment and Goals of Care:  Palliative consult for ongoing goals of care discussions has been requested. Chart reviewed, palliative consult request received.  Patient seen and examined.  Call placed and was able to reach daughter.  Brief life review performed.  Patient's daughter states that he was living at home with his wife prior to this hospitalization.  He has a had a rather subacute decline over the course of the past few weeks.  He went from eating 3 meals a day to 2 meals a day.  He has become more bedbound.  He was using a walker but has now declined sharply and ambulation.  He does not carry a formal diagnosis of dementia. We discussed about the patient's medical conditions and how this hospital stay has been.  Goals wishes and values attempted to be explored.  We discussed about the [facility].  Additionally, addressed to patient's wishes.  Patient has a made his wishes known to the family  that at end-of-life, he really does not want to be in a hospital, rather he would want to be at home.  We discussed about monitoring how the patient does at the SNF rehab with the addition of palliative services for additional support.  We also discussed about exploring the option of home with hospice after rehab attempt depending on patient's condition at that time.  All of the patient's daughter's questions welcomed and addressed to the best of my ability.  NEXT OF KIN  Was living at home with his wife, has daughter Delaney Meigs who also lives nearby [phone].   SUMMARY OF RECOMMENDATIONS    Agree with DNR TOC consult noted, note plans for patient to go to [facility] facility.  Recommend  palliative services at [facility] nursing facility.Also discussed with daughter Delaney Meigs [phone] today, at the time of initial consultation with regards to exploring the option of home with hospice services after SNF rehab trial is done.  continue current mode of care for now Thank you for the consult  Code Status/Advance Care Planning: DNR   Symptom Management:     Palliative Prophylaxis:  Delirium Protocol   Psycho-social/Spiritual:  Desire for further Chaplaincy support:yes Additional Recommendations: Caregiving  Support/Resources  Prognosis:  Unable to determine  Discharge Planning: Skilled Nursing Facility for rehab with Palliative care service follow-up      Primary Diagnoses: Present on Admission:  Hyperlipidemia  Essential hypertension   I have reviewed the medical record, interviewed the patient and family, and examined the patient. The following aspects are pertinent.  Past Medical History:  Diagnosis Date   Arthritis    Atrial fibrillation [facility])    sees Dr. Dietrich Pates    Cancer [facility])    ureteral, per Dr. Laverle Patter    Eye disease    sees Dr. Stephannie Li    Hemorrhoids    Hyperlipidemia    Hypertension    Low back pain    TIA (transient ischemic  attack)    Social History   Socioeconomic History   Marital status: Married    Spouse name: Not on file   Number of children: 9   Years of education: Not on file   Highest education level: Not on file  Occupational History   Occupation: Academic librarian for [employer]    Comment: retired   Tobacco Use   Smoking status: Never   Smokeless tobacco: Never  Vaping Use   Vaping status: Never Used  Substance and Sexual Activity   Alcohol use: No    Alcohol/week: 0.0 standard drinks of alcohol   Drug use: No   Sexual activity: Not on file  Other Topics Concern   Not on file  Social History Narrative   Lives with wife on one level house   Has nine children, (two local), with 24 grandchildren and 5 great grandchildren   Works with Systems analyst 2 days/week at [employer]   Attends church with wife, who currently drives him everywhere d/t poor vision   Social Determinants of Health   Financial Resource Strain: Low Risk  (12/31/2022)   Overall Financial Resource Strain (CARDIA)    Difficulty of Paying Living Expenses: Not hard at all  Food Insecurity: No Food Insecurity (03/17/2023)   Hunger Vital Sign    Worried About Running Out of Food in the Last Year: Never true    Ran Out of Food in the Last Year: Never true  Transportation Needs: No Transportation Needs (03/17/2023)   PRAPARE - Administrator, Civil Service (Medical): No    Lack of Transportation (Non-Medical): No  Physical Activity: Inactive (12/31/2022)   Exercise Vital Sign    Days of Exercise per Week: 0 days    Minutes of Exercise per Session: 0 min  Stress: No Stress Concern Present (12/31/2022)   [employer] of Occupational Health - Occupational Stress Questionnaire    Feeling of Stress : Not at all  Social Connections: Moderately Integrated (12/31/2022)   Social Connection and Isolation Panel [NHANES]    Frequency of Communication with Friends and Family: Three times a week     Frequency of Social Gatherings with Friends and Family: Once a week    Attends Religious Services: 1 to 4 times per year    Active Member of [employer] or Organizations: No    Attends Engineer, structural: Never    Marital Status: Married   Family History  Problem Relation Age of Onset   Stroke Father    Coronary artery disease Other        fhx   Stroke Other        fhx   Scheduled Meds:  atorvastatin  40 mg Oral Daily   carbamide peroxide  5  drop Both EARS BID   Chlorhexidine Gluconate Cloth  6 each Topical Daily   ezetimibe  10 mg Oral Daily   feeding supplement  237 mL Oral TID BM   fenofibrate  54 mg Oral Daily   metoprolol succinate  50 mg Oral Daily   multivitamin with minerals  1 tablet Oral Daily   mouth rinse  15 mL Mouth Rinse 4 times per day   sodium chloride flush  3 mL Intravenous Q12H   tamsulosin  0.4 mg Oral Daily   warfarin  2.5 mg Oral ONCE-1600   Warfarin - Pharmacist Dosing Inpatient   Does not apply q1600   Continuous Infusions:  sodium chloride     PRN Meds:.sodium chloride, acetaminophen **OR** acetaminophen, docusate sodium, hydrALAZINE, melatonin, ondansetron **OR** ondansetron (ZOFRAN) IV, mouth rinse, sodium chloride flush Medications Prior to Admission:  Prior to Admission medications   Medication Sig Start Date End Date Taking? Authorizing Provider  acetaminophen (TYLENOL) 500 MG tablet Take 500 mg by mouth every 6 (six) hours as needed for moderate pain.   Yes [provider]  Aflibercept (EYLEA IO) Inject into the eye.   Yes [provider]  atorvastatin (LIPITOR) 40 MG tablet Take 1 tablet by mouth once daily 11/15/22  Yes Nelwyn Salisbury, MD  Cholecalciferol (VITAMIN D) 125 MCG (5000 UT) CAPS Take 5,000 Units by mouth daily.    Yes [provider]  ezetimibe (ZETIA) 10 MG tablet TAKE 1 TABLET BY MOUTH ONCE DAILY PHYSICAL  WITH  LABS  REQUIRED  FOR  GREATER  QUANTITY  OF  REFILLS 02/17/23  Yes Nelwyn Salisbury, MD   fenofibrate (TRICOR) 145 MG tablet Take 1 tablet (145 mg total) by mouth daily. 01/27/23  Yes Nelwyn Salisbury, MD  metoprolol succinate (TOPROL-XL) 25 MG 24 hr tablet Take 1 tablet by mouth once daily 02/11/23  Yes Nelwyn Salisbury, MD  warfarin (COUMADIN) 2.5 MG tablet TAKE 1 TABLET BY MOUTH ONCE DAILY EXCEPT TAKE 1 AND 1/2 ON SUNDAYS, WEDNESDAYS AND FRIDAYS OR AS DIRECTED BY ANTICOAGULATION CLINIC Patient taking differently: Take 2.5-3.75 mg by mouth as directed. TAKE 1 TABLET (2.5 MG) BY MOUTH ONCE DAILY EXCEPT TAKE 1 AND 1/2 TABLETS (3.75 MG) ON SUNDAYS AND THURSDAYS AS DIRECTED BY ANTICOAGULATION CLINIC 09/17/22  Yes Nelwyn Salisbury, MD  furosemide (LASIX) 20 MG tablet Take one tablet every day as needed for fluid Patient not taking: Reported on 03/17/2023 01/28/22   Nelwyn Salisbury, MD  ketoconazole (NIZORAL) 2 % cream APPLY TOPICALLY DAILY Patient not taking: Reported on 03/17/2023 04/19/22   Nelwyn Salisbury, MD  tobramycin (TOBREX) 0.3 % ophthalmic solution Place 1 drop into both eyes as directed. INSTILL 1 DROP INTO RIGHT EYE 4 TIMES DAILY; BEGIN 1 DAY PRIOR TO TREATMENT, CONTINUE THE DAY OF TREATMENT AND ONE DAY AFTER TREATMENT. 01/07/19   [provider]  triamcinolone cream (KENALOG) 0.1 % Apply 1 Application topically 2 (two) times daily. Patient not taking: Reported on 03/17/2023 01/28/22   Nelwyn Salisbury, MD  warfarin (COUMADIN) 1 MG tablet TAKE ONE TABLET BY MOUTH DAILY OR AS DIRECTED BY ANTICOAGULATION CLINIC Patient not taking: Reported on 03/17/2023 04/09/21   Nelwyn Salisbury, MD   Allergies  Allergen Reactions   Penicillins     Unable to breathe    Amoxicillin-Pot Clavulanate     REACTION: throat swelling   Review of Systems + generalized weakness. Physical Exam Early gentleman resting in bed Awake alert Has some  wheezing S1-S2  Vital Signs: BP (!) 156/109 (BP Location: Right Arm)   Pulse (!) 115   Temp 98 F (36.7 C) (Oral)   Resp (!) 22   Ht 6' (1.829 m)   Wt 90 kg   SpO2  99%   BMI 26.92 kg/m  Pain Scale: 0-10   Pain Score: 0-No pain   SpO2: SpO2: 99 % O2 Device:SpO2: 99 % O2 Flow Rate: .   IO: Intake/output summary:  Intake/Output Summary (Last 24 hours) at 03/22/2023 1329 Last data filed at 03/22/2023 1230 Gross per 24 hour  Intake 240 ml  Output 4550 ml  Net -4310 ml    LBM: Last BM Date : 03/21/23 Baseline Weight: Weight: 87.1 kg Most recent weight: Weight: 90 kg     Palliative Assessment/Data:   PPS 50%  Time In:  12.30 Time Out:  1330 Time Total:  60  Greater than 50%  of this time was spent counseling and coordinating care related to the above assessment and plan.  Signed by: Rosalin Hawking, MD   Please contact Palliative Medicine Team phone at [phone] for questions and concerns.  For individual provider: See Loretha Stapler

## 2023-03-22 NOTE — Progress Notes (Signed)
ANTICOAGULATION CONSULT NOTE  Pharmacy Consult for Warfarin Indication: atrial fibrillation  Allergies  Allergen Reactions   Penicillins     Unable to breathe    Amoxicillin-Pot Clavulanate     REACTION: throat swelling    Patient Measurements: Height: 6' (182.9 cm) Weight: 93.4 kg (206 lb) IBW/kg (Calculated) : 77.6    Vital Signs: Temp: 98 F (36.7 C) (08/10 1032) Temp Source: Oral (08/10 1032) BP: 156/109 (08/10 1032) Pulse Rate: 115 (08/10 1032)  Labs: Recent Labs    03/20/23 1039 03/21/23 0453 03/21/23 0906 03/22/23 0516  HGB 13.1 12.2*  --  12.2*  HCT 38.4* 36.3*  --  35.8*  PLT 181 184  --  188  LABPROT 27.0* 26.8*  --  26.7*  INR 2.5* 2.5*  --  2.4*  CREATININE 1.10  --  1.10 1.22    Estimated Creatinine Clearance: 45.8 mL/min (by C-G formula based on SCr of 1.22 mg/dL).   Medical History: Past Medical History:  Diagnosis Date   Arthritis    Atrial fibrillation [facility])    sees Dr. Dietrich Pates    Cancer [facility])    ureteral, per Dr. Alberteen Sam disease    sees Dr. Stephannie Li    Hemorrhoids    Hyperlipidemia    Hypertension    Low back pain    TIA (transient ischemic attack)     Medications:  PTA Warfarin 2.5mg  daily except 3.75mg  Sun/Thur  Assessment:  89 yr male with generalized weakness and progressive confusion this past week.   CT Abdomen/pelvis with bladder wall thickening, trabeculation with severe diverticula compatible with chronic bladder outlet obstruction and prostate enlargement.  No previous history of BPH in the past.  PMH significant for HTN, AFib (on warfarin), colon cancer, TIA Patient reports LD of warfarin prior to admission = 03/16/23 INR on admission 8/5 = 3.6 (supratherapeutic) and 8/5 warfarin dose held  Today, 03/22/23  - INR = 2.4, therapeutic - Hgb= 12.2 g/dL, slightly low - Plt: 626 K/uL, WNL - no reports of bleeding per RN  Goal of Therapy:  INR 2-3 Monitor platelets by anticoagulation protocol: Yes    Plan:  - Repeat Warfarin 2.5 mg x 1 dose today  - Daily PT/INR - CBC tomorrow - Consider dose reduction at discharge.  Josefa Half, PharmD, BCPS 03/22/2023,11:55 AM

## 2023-03-22 NOTE — Plan of Care (Signed)

## 2023-03-23 DIAGNOSIS — R531 Weakness: Secondary | ICD-10-CM | POA: Diagnosis not present

## 2023-03-23 DIAGNOSIS — E871 Hypo-osmolality and hyponatremia: Secondary | ICD-10-CM | POA: Diagnosis not present

## 2023-03-23 LAB — COMPREHENSIVE METABOLIC PANEL WITH GFR
ALT: 24 U/L (ref 0–44)
AST: 31 U/L (ref 15–41)
Albumin: 3 g/dL — ABNORMAL LOW (ref 3.5–5.0)
Alkaline Phosphatase: 63 U/L (ref 38–126)
Anion gap: 10 (ref 5–15)
BUN: 18 mg/dL (ref 8–23)
CO2: 24 mmol/L (ref 22–32)
Calcium: 8.9 mg/dL (ref 8.9–10.3)
Chloride: 96 mmol/L — ABNORMAL LOW (ref 98–111)
Creatinine, Ser: 1.2 mg/dL (ref 0.61–1.24)
GFR, Estimated: 57 mL/min — ABNORMAL LOW (ref >60)
Glucose, Bld: 117 mg/dL — ABNORMAL HIGH (ref 70–99)
Potassium: 3.5 mmol/L (ref 3.5–5.1)
Sodium: 130 mmol/L — ABNORMAL LOW (ref 135–145)
Total Bilirubin: 0.9 mg/dL (ref 0.3–1.2)
Total Protein: 5.8 g/dL — ABNORMAL LOW (ref 6.5–8.1)

## 2023-03-23 LAB — CBC
HCT: 37.2 % — ABNORMAL LOW (ref 39.0–52.0)
Hemoglobin: 13.2 g/dL (ref 13.0–17.0)
MCH: 32.5 pg (ref 26.0–34.0)
MCHC: 35.5 g/dL (ref 30.0–36.0)
MCV: 91.6 fL (ref 80.0–100.0)
Platelets: 233 10*3/uL (ref 150–400)
RBC: 4.06 MIL/uL — ABNORMAL LOW (ref 4.22–5.81)
RDW: 13.2 % (ref 11.5–15.5)
WBC: 5.5 10*3/uL (ref 4.0–10.5)
nRBC: 0 % (ref 0.0–0.2)

## 2023-03-23 LAB — PROTIME-INR
INR: 2 — ABNORMAL HIGH (ref 0.8–1.2)
Prothrombin Time: 23 s — ABNORMAL HIGH (ref 11.4–15.2)

## 2023-03-23 MED ORDER — WARFARIN SODIUM 2.5 MG PO TABS
3.7500 mg | ORAL_TABLET | Freq: Once | ORAL | Status: AC
  Administered 2023-03-23: 3.75 mg via ORAL
  Filled 2023-03-23: qty 1

## 2023-03-23 NOTE — Progress Notes (Signed)
Progress Note   Patient: Justin Gallegos LKG:[id number] DOB: 05-22-1929 DOA: 03/16/2023     6 DOS: the patient was seen and examined on 03/23/2023   Brief hospital course: 89 y.o. male with past medical history significant for chronic diastolic congestive heart failure (LVEF 55-60%), HTN, HLD, permanent atrial fibrillation on warfarin, history of TIA, chronic low back pain, hemorrhoids who presented to [facility] ED on 8/5 via EMS from home with progressive weakness, confusion; ongoing x 1 week.  At baseline, able to ambulate at home however was too weak to stand when attempting to transfer to his bedside commode.  On EMS arrival, patient was found to be in atrial fibrillation with intermittent tachycardia.  He was given 300 cc of IV fluids and brought to the ED for further evaluation.   In the ED, temperature 97.8 F, HR 104, RR 18, BP 134/99, SpO2 97% on room air.  WBC 9.8, hemoglobin 14.1, platelet count 194.  Sodium 125, potassium 4.2, chloride 97, CO2 17, glucose 114, BUN 15, creatinine 1.48.  Magnesium 1.6.  AST 27, ALT 20, total bilirubin 1.0.  High sensitive troponin 13.  Lactic acid 1.8.  CT head obtained which did not show any acute intracranial process, noted remote right cerebral infraction. CT chest stand abdomen pelvis showed cardiomegaly, coronary disease, aortic atherosclerosis.  Bibasilar atelectasis.  Colonic diverticulosis.  Prostate enlargement.  Bladder wall thickening and trabeculation with severe diverticular compatible of chronic bladder outlet obstruction.  No acute finding. Patient received LR 500 bolus.  Magnesium 2 g and started on NS 75 cc/h. Called urology Dr.Machen regarding the bladder obstruction and BPH with recommendation for foley and start Flomax.   TRH consulted for admission for further evaluation and management of acute metabolic encephalopathy, hyponatremia.  Assessment and Plan: Hyponatremia Hypochloremia Patient presenting with sodium 125, chloride  97.  Complicated by home Lasix use.  CT head unremarkable except right cerebral hemisphere remote infarction.  Urine sodium 79.  Urine SG 1.017 within normal limits.  Etiology likely secondary to DHI hydration, poor oral intake as well as home Lasix use. -- Na now improved after IVF hydration. Holding further IVF for now -Recheck bmet in AM   Hypocalcemia Serum calcium 8.3, ionized calcium 1.10 on admission. S/p Calcium gluconate 1 g IV 8/5 -- corrected calcium unremarkable   Hypomagnesemia -replaced and within normal limits   Acute metabolic encephalopathy with underlying dementia: Improving Etiology likely secondary to poor oral intake, electrolyte disturbance.  Patient is afebrile without leukocytosis.  CT head without contrast with no acute intracranial hemorrhage or infarct, progressive parenchymal volume loss, remote right cerebellar hemisphere infarct, which is new since prior examination.  CT chest/abdomen/pelvis with no acute findings.  -- Pt does have hx dementia. Daughter in room has observed sharp decline recently.  -Palliative Care following. DNR wishes noted. Recs for palliative care referral at facility   Chronic bladder outlet obstruction BPH CT Abdomen/pelvis with bladder wall thickening, trabeculation with severe diverticula compatible with chronic bladder outlet obstruction and prostate enlargement.  No previous history of BPH in the past.  Admitting physician discussed with urology, Dr. Lafonda Mosses; recommended Foley catheter placement and initiation of Flomax. --Continue Foley catheter -- Flomax 0.4 mg p.o. daily -- good urine output   Chronic/permanent atrial fibrillation on anticoagulation Essential hypertension Acutely decompensated Chronic diastolic congestive heart failure TTE 04/10/2020 with LVEF 55 to 60%, moderate LVH, no LV regional wall motion abnormalities, IVC normal in size, no aortic stenosis. -- Metoprolol succinate 25 mg  p.o. daily -- Cont coumadin with  pharmacy to dose -- Cont to follow INR, currently 2.0 -Now clinically volume overloaded. Held further IVF. Giving dose of IV albumin. Pt with audible wheezing and LE edema. Continue IV lasix for now   Hyperlipidemia Aortic atherosclerosis -- Atorvastatin -- Fenofibrate   CKD stage IIIb Creatinine baseline 1.5-1.8.  Creatinine 1.60 on admission, at baseline. -- Cr now stable -Cont to follow bmet trends   Weakness/debility/deconditioning/gait disturbance: Patient resides at home. -- PT/OT recs noted for SNF      Subjective: Tired this AM. Without complaints  Physical Exam: Vitals:   03/22/23 2053 03/23/23 0445 03/23/23 0500 03/23/23 1412  BP: 116/84 (!) 167/100  (!) 163/99  Pulse: 92 (!) 110  89  Resp: 18 18  18   Temp: 97.7 F (36.5 C) 98.2 F (36.8 C)  98.2 F (36.8 C)  TempSrc: Oral Oral    SpO2: 99% 97%  96%  Weight:   86 kg   Height:       General exam: Awake, laying in bed, in nad Respiratory system: Normal respiratory effort, no wheezing Cardiovascular system: regular rate, s1, s2 Gastrointestinal system: Soft, nondistended, positive BS Central nervous system: CN2-12 grossly intact, strength intact Extremities: Perfused, no clubbing Skin: Normal skin turgor, no notable skin lesions seen Psychiatry: Mood normal // no visual hallucinations   Data Reviewed:  Labs reviewed: Na 130, K 3.5, Cr 1.20, WBC 5.5, Hgb 13.2  Family Communication: Pt in room, family not at bedside  Disposition: Status is: Inpatient Remains inpatient appropriate because: Severity of illness  Planned Discharge Destination: Skilled nursing facility    Author: Rickey Barbara, MD 03/23/2023 4:32 PM  For on call review [email or web address].

## 2023-03-23 NOTE — Progress Notes (Signed)
ANTICOAGULATION CONSULT NOTE  Pharmacy Consult for Warfarin Indication: atrial fibrillation  Allergies  Allergen Reactions   Penicillins     Unable to breathe    Amoxicillin-Pot Clavulanate     REACTION: throat swelling    Patient Measurements: Height: 6' (182.9 cm) Weight: 86 kg (189 lb 9.6 oz) IBW/kg (Calculated) : 77.6    Vital Signs: Temp: 98.2 F (36.8 C) (08/11 0445) Temp Source: Oral (08/11 0445) BP: 167/100 (08/11 0445) Pulse Rate: 110 (08/11 0445)  Labs: Recent Labs    03/21/23 0453 03/21/23 0906 03/22/23 0516 03/23/23 0508  HGB 12.2*  --  12.2* 13.2  HCT 36.3*  --  35.8* 37.2*  PLT 184  --  188 233  LABPROT 26.8*  --  26.7* 23.0*  INR 2.5*  --  2.4* 2.0*  CREATININE  --  1.10 1.22 1.20    Estimated Creatinine Clearance: 43.1 mL/min (by C-G formula based on SCr of 1.2 mg/dL).   Medical History: Past Medical History:  Diagnosis Date   Arthritis    Atrial fibrillation [facility])    sees Dr. Dietrich Pates    Cancer [facility])    ureteral, per Dr. Alberteen Sam disease    sees Dr. Stephannie Li    Hemorrhoids    Hyperlipidemia    Hypertension    Low back pain    TIA (transient ischemic attack)     Medications:  PTA Warfarin 2.5mg  daily except 3.75mg  Sun/Thur  Assessment:  89 yr male with generalized weakness and progressive confusion this past week.   CT Abdomen/pelvis with bladder wall thickening, trabeculation with severe diverticula compatible with chronic bladder outlet obstruction and prostate enlargement.  No previous history of BPH in the past.  PMH significant for HTN, AFib (on warfarin), colon cancer, TIA Patient reports LD of warfarin prior to admission = 03/16/23 INR on admission 8/5 = 3.6 (supratherapeutic) and 8/5 warfarin dose held  Today, 03/23/23  - INR = 2, low end of therapeutic and has trended down - Hgb= 13.2 g/dL, slightly low - Plt: 284 K/uL, WNL - no reports of bleeding per RN  Goal of Therapy:  INR 2-3 Monitor platelets by  anticoagulation protocol: Yes   Plan:  - Repeat Warfarin 3.75 mg x 1 dose today (home dose)  - Daily PT/INR - CBC tomorrow - Consider dose reduction at discharge.  Tacy Learn, PharmD Clinical Pharmacist 03/23/2023 9:00 AM

## 2023-03-23 NOTE — Plan of Care (Signed)
  Problem: Clinical Measurements: Goal: Diagnostic test results will improve Outcome: Progressing   Problem: Activity: Goal: Risk for activity intolerance will decrease Outcome: Progressing   Problem: Nutrition: Goal: Adequate nutrition will be maintained Outcome: Progressing   Problem: Pain Managment: Goal: General experience of comfort will improve Outcome: Progressing   Problem: Safety: Goal: Ability to remain free from injury will improve Outcome: Progressing   

## 2023-03-24 DIAGNOSIS — E871 Hypo-osmolality and hyponatremia: Secondary | ICD-10-CM | POA: Diagnosis not present

## 2023-03-24 DIAGNOSIS — R531 Weakness: Secondary | ICD-10-CM | POA: Diagnosis not present

## 2023-03-24 MED ORDER — WARFARIN SODIUM 2.5 MG PO TABS
2.5000 mg | ORAL_TABLET | Freq: Every day | ORAL | Status: DC
  Administered 2023-03-24: 2.5 mg via ORAL
  Filled 2023-03-24: qty 1

## 2023-03-24 NOTE — TOC Progression Note (Signed)
Transition of Care [facility]) - Progression Note    Patient Details  Name: Justin Gallegos MRN: [id number] Date of Birth: [birth year]  Transition of Care [facility]) CM/SW Contact  Howell Rucks, RN Phone Number: 03/24/2023, 9:51 AM  Clinical Narrative:   Pt seen by Palliative Medicine Team, recommendation for Palliatve care at short term rehab Facility-[facility]. NCM outreached to [facility], rep-Christy to verify contracted Palliative care provider, vm left with my name and phone number for call back.     Expected Discharge Plan: Skilled Nursing Facility Barriers to Discharge: Continued Medical Work up  Expected Discharge Plan and Services   Discharge Planning Services: CM Consult   Living arrangements for the past 2 months: Single Family Home                                       Social Determinants of Health (SDOH) Interventions SDOH Screenings   Food Insecurity: No Food Insecurity (03/17/2023)  Housing: Low Risk  (03/17/2023)  Transportation Needs: No Transportation Needs (03/17/2023)  Utilities: Not At Risk (03/17/2023)  Alcohol Screen: Low Risk  (12/31/2022)  Depression (PHQ2-9): Low Risk  (12/31/2022)  Financial Resource Strain: Low Risk  (12/31/2022)  Physical Activity: Inactive (12/31/2022)  Social Connections: Moderately Integrated (12/31/2022)  Stress: No Stress Concern Present (12/31/2022)  Tobacco Use: Low Risk  (03/17/2023)    Readmission Risk Interventions     No data to display

## 2023-03-24 NOTE — Progress Notes (Signed)
Agree with previous nurse shift assessment and care.

## 2023-03-24 NOTE — Progress Notes (Signed)
 [facility] Liaison Note  Notified by [facility] manager of patient/family request for [facility] palliative services at home after discharge.   [facility] hospital liaison will follow patient for discharge disposition.   Please call with any hospice or outpatient palliative care related questions.   Thank you for the opportunity to participate in this patient's care.   Dionicio Stall, [facility] Liaison [phone]

## 2023-03-24 NOTE — Progress Notes (Signed)
Physical Therapy Treatment Patient Details Name: DECLEN HOMRICH MRN: [id number] DOB: [birth year] Today's Date: 03/24/2023   History of Present Illness 89 yr old male admitted with weakness and confusion. He was found to have hyponatremia and acute metabolic encephalopathy. PMH: HF, essential HTN, Afib, TIA, arthritis,  ureteral CA    PT Comments  Pt with some improvement in transfer ability but was found to have symptomatic orthostatic hypotension that limited to transfer to chair only.  Increased time and cues throughout session with mod A to light mod A of 2 for safety with transfers.  Continue to progress as able.   Orthostatic Blood Pressures:  Sitting 122/75 Standing 95/57 and symptomatic Return to sitting 114/80, symptoms resolve In recliner 107/72  HR consistently 100-110 bpm    If plan is discharge home, recommend the following: Assistance with cooking/housework;Assist for transportation;Help with stairs or ramp for entrance;A lot of help with walking and/or transfers;A lot of help with bathing/dressing/bathroom   Can travel by private vehicle     No  Equipment Recommendations  Rolling walker (2 wheels)    Recommendations for Other Services       Precautions / Restrictions Precautions Precautions: Fall Restrictions Weight Bearing Restrictions: No     Mobility  Bed Mobility Overal bed mobility: Needs Assistance Bed Mobility: Supine to Sit     Supine to sit: Min assist     General bed mobility comments: Increased time and multimodal cues, pt able to roll and move legs to EOB then min A to lift trunk    Transfers Overall transfer level: Needs assistance Equipment used: Rolling walker (2 wheels) Transfers: Sit to/from Stand, Bed to chair/wheelchair/BSC Sit to Stand: Mod assist, +2 safety/equipment   Step pivot transfers: Mod assist, +2 safety/equipment       General transfer comment: Sit to stand x 3 from bed requiring cues for hand placment and light  mod A of 2 to rise.  Pt experiencing some lightheadedness and was found to have orthostatic hypotension with recovery in sitting (see above).  Pt preferred to return to bed but with encouragement and education on benefits of OOB activity agreed to transfer to chair. After resting was able to step pivot to recliner but further ambulation held due to orthostatic hypotension - notified RN.    Ambulation/Gait Ambulation/Gait assistance: Mod assist, +2 safety/equipment Gait Distance (Feet): 2 Feet Assistive device: Rolling walker (2 wheels) Gait Pattern/deviations: Shuffle Gait velocity: decreased     General Gait Details: Small steps to chair only with assist for balance and RW   Stairs             Wheelchair Mobility     Tilt Bed    Modified Rankin (Stroke Patients Only)       Balance Overall balance assessment: Needs assistance Sitting-balance support: No upper extremity supported Sitting balance-Leahy Scale: Good     Standing balance support: Bilateral upper extremity supported, Reliant on assistive device for balance Standing balance-Leahy Scale: Poor Standing balance comment: RW and min-mod A with tendency to posterior lean                            Cognition Arousal: Alert Behavior During Therapy: WFL for tasks assessed/performed Overall Cognitive Status: No family/caregiver present to determine baseline cognitive functioning  General Comments: Pt oriented to self, year, hospital.  He is very HOH making cognitive assessment difficult. He did follow basic commands with increased time and was able to express needs.        Exercises      General Comments        Pertinent Vitals/Pain Pain Assessment Pain Assessment: No/denies pain    Home Living                          Prior Function            PT Goals (current goals can now be found in the care plan section) Progress towards PT  goals: Progressing toward goals    Frequency    Min 1X/week      PT Plan      Co-evaluation              AM-PAC PT "6 Clicks" Mobility   Outcome Measure  Help needed turning from your back to your side while in a flat bed without using bedrails?: A Lot Help needed moving from lying on your back to sitting on the side of a flat bed without using bedrails?: A Lot Help needed moving to and from a bed to a chair (including a wheelchair)?: A Lot Help needed standing up from a chair using your arms (e.g., wheelchair or bedside chair)?: A Lot Help needed to walk in hospital room?: Total Help needed climbing 3-5 steps with a railing? : Total 6 Click Score: 10    End of Session Equipment Utilized During Treatment: Gait belt Activity Tolerance: Other (comment) (LImited by orthostatic hypotension) Patient left: in chair;with call bell/phone within reach;with chair alarm set Nurse Communication: Mobility status;Other (comment) (orthostatic hypotension (resolved in sitting so pt in chair) PT Visit Diagnosis: Muscle weakness (generalized) (M62.81);Difficulty in walking, not elsewhere classified (R26.2)     Time: 4098-1191 PT Time Calculation (min) (ACUTE ONLY): 23 min  Charges:    $Therapeutic Activity: 23-37 mins PT General Charges $$ ACUTE PT VISIT: 1 Visit                     Anise Salvo, PT Acute Rehab [facility] [phone]    Rayetta Humphrey 03/24/2023, 12:20 PM

## 2023-03-24 NOTE — Progress Notes (Signed)
ANTICOAGULATION CONSULT NOTE  Pharmacy Consult for Warfarin Indication: atrial fibrillation  Allergies  Allergen Reactions   Penicillins     Unable to breathe    Amoxicillin-Pot Clavulanate     REACTION: throat swelling    Patient Measurements: Height: 6' (182.9 cm) Weight: 86 kg (189 lb 9.6 oz) IBW/kg (Calculated) : 77.6    Vital Signs: Temp: 98.3 F (36.8 C) (08/12 0455) Temp Source: Oral (08/12 0455) BP: 136/97 (08/12 0455) Pulse Rate: 97 (08/12 0455)  Labs: Recent Labs    03/22/23 0516 03/23/23 0508 03/24/23 0516  HGB 12.2* 13.2 13.3  HCT 35.8* 37.2* 38.6*  PLT 188 233 244  LABPROT 26.7* 23.0* 24.5*  INR 2.4* 2.0* 2.2*  CREATININE 1.22 1.20 1.16    Estimated Creatinine Clearance: 44.6 mL/min (by C-G formula based on SCr of 1.16 mg/dL).   Medications:  PTA Warfarin 2.5mg  daily except 3.75mg  Sun/Thur  Assessment:  89 yr male with generalized weakness and progressive confusion this past week.   CT Abdomen/pelvis with bladder wall thickening, trabeculation with severe diverticula compatible with chronic bladder outlet obstruction and prostate enlargement.  No previous history of BPH in the past.  PMH significant for HTN, AFib (on warfarin), colon cancer, TIA Patient reports LD of warfarin prior to admission = 03/16/23 INR on admission 8/5 = 3.6 (supratherapeutic) and 8/5 warfarin dose held  Today, 03/24/23  INR = 2.2 therapeutic Hgb= 13.3 g/dL Plt: 44 K/uL, WNL, stable no reports of bleeding per RN  Goal of Therapy:  INR 2-3 Monitor platelets by anticoagulation protocol: Yes   Plan:  - Warfarin 2.5 mg daily - Daily PT/INR - Consider dose reduction at discharge.  Herby Abraham, Pharm.D Use secure chat for questions 03/24/2023 8:28 AM

## 2023-03-24 NOTE — Progress Notes (Signed)
Progress Note   Patient: Justin Gallegos ZOX:[id number] DOB: [birth year] DOA: 03/16/2023     7 DOS: the patient was seen and examined on 03/24/2023   Brief hospital course: 89 y.o. male with past medical history significant for chronic diastolic congestive heart failure (LVEF 55-60%), HTN, HLD, permanent atrial fibrillation on warfarin, history of TIA, chronic low back pain, hemorrhoids who presented to [facility] ED on 8/5 via EMS from home with progressive weakness, confusion; ongoing x 1 week.  At baseline, able to ambulate at home however was too weak to stand when attempting to transfer to his bedside commode.  On EMS arrival, patient was found to be in atrial fibrillation with intermittent tachycardia.  He was given 300 cc of IV fluids and brought to the ED for further evaluation.   In the ED, temperature 97.8 F, HR 104, RR 18, BP 134/99, SpO2 97% on room air.  WBC 9.8, hemoglobin 14.1, platelet count 194.  Sodium 125, potassium 4.2, chloride 97, CO2 17, glucose 114, BUN 15, creatinine 1.48.  Magnesium 1.6.  AST 27, ALT 20, total bilirubin 1.0.  High sensitive troponin 13.  Lactic acid 1.8.  CT head obtained which did not show any acute intracranial process, noted remote right cerebral infraction. CT chest stand abdomen pelvis showed cardiomegaly, coronary disease, aortic atherosclerosis.  Bibasilar atelectasis.  Colonic diverticulosis.  Prostate enlargement.  Bladder wall thickening and trabeculation with severe diverticular compatible of chronic bladder outlet obstruction.  No acute finding. Patient received LR 500 bolus.  Magnesium 2 g and started on NS 75 cc/h. Called urology Dr.Machen regarding the bladder obstruction and BPH with recommendation for foley and start Flomax.   TRH consulted for admission for further evaluation and management of acute metabolic encephalopathy, hyponatremia.  Assessment and Plan: Hyponatremia Hypochloremia Patient presenting with sodium 125, chloride  97.  Complicated by home Lasix use.  CT head unremarkable except right cerebral hemisphere remote infarction.  Urine sodium 79.  Urine SG 1.017 within normal limits.  Etiology likely secondary to DHI hydration, poor oral intake as well as home Lasix use. -- Na now improved after IVF hydration. Holding further IVF for now. Encourage PO as tolerated -Recheck bmet in AM   Hypocalcemia Serum calcium 8.3, ionized calcium 1.10 on admission. S/p Calcium gluconate 1 g IV 8/5 -- corrected calcium unremarkable   Hypomagnesemia -replaced and within normal limits   Acute metabolic encephalopathy with underlying dementia: Improving Etiology likely secondary to poor oral intake, electrolyte disturbance.  Patient is afebrile without leukocytosis.  CT head without contrast with no acute intracranial hemorrhage or infarct, progressive parenchymal volume loss, remote right cerebellar hemisphere infarct, which is new since prior examination.  CT chest/abdomen/pelvis with no acute findings.  -- Pt does have hx dementia. Daughter in room has observed sharp decline recently.  -Palliative Care following. DNR wishes noted. Plan for Palliative Care consultation at short-term rehab facility   Chronic bladder outlet obstruction BPH CT Abdomen/pelvis with bladder wall thickening, trabeculation with severe diverticula compatible with chronic bladder outlet obstruction and prostate enlargement.  No previous history of BPH in the past.  Admitting physician discussed with urology, Dr. Lafonda Mosses; recommended Foley catheter placement and initiation of Flomax. --Continue Foley catheter -- Flomax 0.4 mg p.o. daily -- good urine output   Chronic/permanent atrial fibrillation on anticoagulation Essential hypertension Acutely decompensated Chronic diastolic congestive heart failure TTE 04/10/2020 with LVEF 55 to 60%, moderate LVH, no LV regional wall motion abnormalities, IVC normal in size, no aortic  stenosis. -- Metoprolol  succinate 25 mg p.o. daily -- Cont coumadin with pharmacy to dose -- Cont to follow INR, currently 2.0 -later became clinically volume overloaded, thus held further IVF.  -Now appears euvolemic   Hyperlipidemia Aortic atherosclerosis -- Atorvastatin -- Fenofibrate   CKD stage IIIb Creatinine baseline 1.5-1.8.  Creatinine 1.60 on admission, at baseline. -- Cr now stable -Cont to follow bmet trends   Weakness/debility/deconditioning/gait disturbance: Patient resides at home PTA -- PT/OT recs noted for SNF      Subjective: Without complaints this AM. Reports haven eaten good breakfast this AM  Physical Exam: Vitals:   03/23/23 1412 03/23/23 2030 03/24/23 0455 03/24/23 1531  BP: (!) 163/99 (!) 146/99 (!) 136/97 127/79  Pulse: 89 95 97 95  Resp: 18 15    Temp: 98.2 F (36.8 C) 97.6 F (36.4 C) 98.3 F (36.8 C) 97.7 F (36.5 C)  TempSrc:  Oral Oral Oral  SpO2: 96% 99% 98% 100%  Weight:      Height:       General exam: Conversant, in no acute distress Respiratory system: normal chest rise, clear, no audible wheezing Cardiovascular system: regular rhythm, s1-s2 Gastrointestinal system: Nondistended, nontender, pos BS Central nervous system: No seizures, no tremors Extremities: No cyanosis, no joint deformities Skin: No rashes, no pallor Psychiatry: Affect normal // no auditory hallucinations   Data Reviewed:  Labs reviewed: Na 130, K 3.9, Cr 1.16, WBC 5.3, Hgb 13.3, Plts 244  Family Communication: Pt in room, family not at bedside  Disposition: Status is: Inpatient Remains inpatient appropriate because: Severity of illness  Planned Discharge Destination: Skilled nursing facility    Author: Rickey Barbara, MD 03/24/2023 5:11 PM  For on call review [email or web address].

## 2023-03-24 NOTE — Progress Notes (Signed)
Speech Language Pathology Treatment: Dysphagia  Patient Details Name: Justin Gallegos MRN: [id number] DOB: 09/08/1928 Today's Date: 03/24/2023 Time: 7829-5621 SLP Time Calculation (min) (ACUTE ONLY): 25 min  Assessment / Plan / Recommendation Clinical Impression  Pt seen for skilled SLP for dysphagia management.   Wife and pt's brother arrived during session.  Pt with hearing loss and with delayed responses.  He was observed with peanut butter jelly, grapes and water consumption.  Adequate mastication and oral clearance across all po - he is however spitting out peanuts from crunchy peanut butter.    Pt also with white coating on lingual surface that did not clear with dental brushing - ? Beginnings of oral candidiasis?  Messaged MD with picture.  Encouraged with and pt to have pt brush teeth am and pm for pulmonary health.   Will advance diet to regular/thin with general precautions.   Wife reports pt takes all of his pills at once with liquids - SLP advised against to provide them one at a time with liquids for maximal airway protection.  Swallow remains delayed - likely due to his cognition and his h/o cerebellar infarct. Will sign off as all education completed.        HPI HPI: Patient is a 89 y.o. male with PMH: HTN, HLD, chronic diastolic CHF, permanent a-fib, h/o TIA, chronic low back pain. He presented to the hospital on 03/17/2023 via EMS from home with progressive weakness, confusion which had been ongoing for past week. When EMS arrived, patient was in a-fib with intermittent tachycardi. In ED, he was afebrile, SpO2 97% on RA. CT head did not show any acute intracranial process. He was admitted with acute metabolic encephalopathy, hyponatremia with etiology likely secondary to poor oral intake, electrolyte imbalance.      SLP Plan  All goals met      Recommendations for follow up therapy are one component of a multi-disciplinary discharge planning process, led by the attending  physician.  Recommendations may be updated based on patient status, additional functional criteria and insurance authorization.    Recommendations  Diet recommendations: Regular Medication Administration: Whole meds with liquid Compensations: Slow rate;Small sips/bites                  Oral care BID     Dysphagia, unspecified (R13.10)     All goals met   Rolena Infante, MS [facility] SLP Acute Rehab Services Office [phone]   Chales Abrahams  03/24/2023, 1:25 PM

## 2023-03-24 NOTE — Progress Notes (Signed)
Daily Progress Note   Patient Name: Justin Gallegos       Date: 03/24/2023 DOB: 18-Oct-1928  Age: 89 y.o. MRN#: [id number] Attending Physician: Jerald Kief, MD Primary Care Physician: Nelwyn Salisbury, MD Admit Date: 03/16/2023  Reason for Consultation/Follow-up: Establishing goals of care  Subjective: Resting in bed, no distress.   Length of Stay: 7  Current Medications: Scheduled Meds:   atorvastatin  40 mg Oral Daily   carbamide peroxide  5 drop Both EARS BID   Chlorhexidine Gluconate Cloth  6 each Topical Daily   ezetimibe  10 mg Oral Daily   feeding supplement  237 mL Oral TID BM   fenofibrate  54 mg Oral Daily   metoprolol succinate  50 mg Oral Daily   multivitamin with minerals  1 tablet Oral Daily   mouth rinse  15 mL Mouth Rinse 4 times per day   sodium chloride flush  3 mL Intravenous Q12H   tamsulosin  0.4 mg Oral Daily   warfarin  2.5 mg Oral q1600   Warfarin - Pharmacist Dosing Inpatient   Does not apply q1600    Continuous Infusions:  sodium chloride      PRN Meds: sodium chloride, acetaminophen **OR** acetaminophen, docusate sodium, hydrALAZINE, melatonin, ondansetron **OR** ondansetron (ZOFRAN) IV, mouth rinse, sodium chloride flush  Physical Exam         Awake No distress Regular work of breathing Appears with generalized weakness  Vital Signs: BP (!) 136/97 (BP Location: Left Arm)   Pulse 97   Temp 98.3 F (36.8 C) (Oral)   Resp 15   Ht 6' (1.829 m)   Wt 86 kg   SpO2 98%   BMI 25.71 kg/m  SpO2: SpO2: 98 % O2 Device: O2 Device: Room Air O2 Flow Rate:    Intake/output summary:  Intake/Output Summary (Last 24 hours) at 03/24/2023 1246 Last data filed at 03/24/2023 1209 Gross per 24 hour  Intake 600 ml  Output 2350 ml  Net -1750 ml   LBM:  Last BM Date : 03/23/23 Baseline Weight: Weight: 87.1 kg Most recent weight: Weight: 86 kg       Palliative Assessment/Data:      Patient Active Problem List   Diagnosis Date Noted   Malnutrition of moderate degree 03/18/2023   Hyponatremia 03/17/2023  Hypomagnesemia 03/17/2023   Acute metabolic encephalopathy 03/17/2023   BPH (benign prostatic hyperplasia) 03/17/2023   Atherosclerosis 03/17/2023   Chronic diastolic CHF (congestive heart failure) (HCC) 03/17/2023   Atrial fibrillation, chronic (HCC) 03/17/2023   Mild bibasilar atelectasis 03/17/2023   Metabolic acidosis 03/17/2023   Bilateral leg edema 01/28/2022   Stasis dermatitis of both legs 01/28/2022   Ankle edema, bilateral 02/09/2020   B12 deficiency 09/01/2018   Long term (current) use of anticoagulants 07/16/2017   Branch retinal vein occlusion of left eye 10/14/2016   Rectal itching 04/05/2014   Tinea cruris 04/05/2014   Encounter for therapeutic drug monitoring 09/27/2013   Carotid bruit 07/21/2013   Infected sebaceous cyst of skin 02/19/2011   Chronic anticoagulation 02/19/2011   Transient cerebral ischemia 07/31/2010   ACUTE BRONCHITIS 05/29/2010   TUBULOVILLOUS ADENOMA, COLON, HX OF 05/17/2010   Disorder resulting from impaired renal function 10/24/2009   PERSONAL HX MALIG NEOPLASM OTH URINARY ORGAN 03/02/2009   ALLERGIC RHINITIS 11/03/2008   LOW BACK PAIN 04/19/2008   Hyperlipidemia 06/11/2007   Essential hypertension 06/11/2007   OSTEOARTHRITIS 06/11/2007   CHICKENPOX, HX OF 06/11/2007   Atrial fibrillation (HCC) 03/13/2007    Palliative Care Assessment & Plan   Patient Profile:    Assessment:  89 year old gentleman who lives at home with his wife and is admitted to hospital medicine service for electrolyte abnormalities, acute metabolic encephalopathy likely stemming from underlying dementia, chronic bladder outlet obstruction with history of BPH, chronic A-fib with acute/decompensated chronic  diastolic congestive heart failure. Patient also has underlying stage IIIb chronic kidney disease dyslipidemia aortic atherosclerosis. Additionally, he has a had a rather subacute functional status decline with ongoing weakness debility deconditioning and gait disturbance.   Recommendations/Plan: Continue current mode of care Appreciate TOC looking into arranging for palliative consultation at [facility] facility.    Code Status:    Code Status Orders  (From admission, onward)           Start     Ordered   03/17/23 0540  Do not attempt resuscitation (DNR)  Continuous       Question Answer Comment  If patient has no pulse and is not breathing Do Not Attempt Resuscitation   If patient has a pulse and/or is breathing: Medical Treatment Goals LIMITED ADDITIONAL INTERVENTIONS: Use medication/IV fluids and cardiac monitoring as indicated; Do not use intubation or mechanical ventilation (DNI), also provide comfort medications.  Transfer to Progressive/Stepdown as indicated, avoid Intensive Care.   Consent: Discussion documented in EHR or advanced directives reviewed      03/17/23 0541           Code Status History     This patient has a current code status but no historical code status.       Prognosis:  Unable to determine  Discharge Planning: Skilled Nursing Facility for rehab with Palliative care service follow-up  Care plan was discussed with  IDT  Thank you for allowing the Palliative Medicine Team to assist in the care of this patient. Low MDM     Greater than 50%  of this time was spent counseling and coordinating care related to the above assessment and plan.  Rosalin Hawking, MD  Please contact Palliative Medicine Team phone at [phone] for questions and concerns.

## 2023-03-24 NOTE — TOC Progression Note (Addendum)
[facility]) - Progression Note    Patient Details  Name: Justin Gallegos MRN: [id number] Date of Birth: 02-Dec-1928  Transition of Care [facility]) CM/SW Contact  Howell Rucks, RN Phone Number: 03/24/2023, 9:58 AM  Clinical Narrative:   MD progress note from 8/11 states pt in volume overload receiving IV Lasix. Will continue to follow for medical stability prior to initiation of authorization.   -4:53pm [facility] for Palliative at SNF.     Expected Discharge Plan: Skilled Nursing Facility Barriers to Discharge: Continued Medical Work up  Expected Discharge Plan and Services   Discharge Planning Services: CM Consult   Living arrangements for the past 2 months: Single Family Home                                       Social Determinants of Health (SDOH) Interventions SDOH Screenings   Food Insecurity: No Food Insecurity (03/17/2023)  Housing: Low Risk  (03/17/2023)  Transportation Needs: No Transportation Needs (03/17/2023)  Utilities: Not At Risk (03/17/2023)  Alcohol Screen: Low Risk  (12/31/2022)  Depression (PHQ2-9): Low Risk  (12/31/2022)  Financial Resource Strain: Low Risk  (12/31/2022)  Physical Activity: Inactive (12/31/2022)  Social Connections: Moderately Integrated (12/31/2022)  Stress: No Stress Concern Present (12/31/2022)  Tobacco Use: Low Risk  (03/17/2023)    Readmission Risk Interventions     No data to display

## 2023-03-25 DIAGNOSIS — R531 Weakness: Secondary | ICD-10-CM | POA: Diagnosis not present

## 2023-03-25 DIAGNOSIS — E871 Hypo-osmolality and hyponatremia: Secondary | ICD-10-CM | POA: Diagnosis not present

## 2023-03-25 MED ORDER — WARFARIN SODIUM 2.5 MG PO TABS
2.5000 mg | ORAL_TABLET | ORAL | Status: DC
  Administered 2023-03-25 – 2023-03-26 (×2): 2.5 mg via ORAL
  Filled 2023-03-25 (×2): qty 1

## 2023-03-25 MED ORDER — WARFARIN SODIUM 2.5 MG PO TABS
3.7500 mg | ORAL_TABLET | ORAL | Status: DC
  Administered 2023-03-27: 3.75 mg via ORAL
  Filled 2023-03-25: qty 1

## 2023-03-25 NOTE — Progress Notes (Signed)
Mobility Specialist - Progress Note   03/25/23 1021  Mobility  Activity Transferred from bed to chair  Level of Assistance Moderate assist, patient does 50-74%  Assistive Device Front wheel walker  Range of Motion/Exercises Active Assistive  Activity Response Tolerated fair  $Mobility charge 1 Mobility  Mobility Specialist Start Time (ACUTE ONLY) 1000  Mobility Specialist Stop Time (ACUTE ONLY) 1020  Mobility Specialist Time Calculation (min) (ACUTE ONLY) 20 min   Pt was found in bed and agreeable to transfer to recliner chair. Pt upon arrival stated being confused due to not remembering falling asleep. Was mod-A for bed mobility and STS. Required multiple cues both verbal and physical. Was left on recliner chair with all needs met. Call bell in reach and chair alarm on. RN notified.  Billey Chang Mobility Specialist

## 2023-03-25 NOTE — Progress Notes (Signed)
ANTICOAGULATION CONSULT NOTE  Pharmacy Consult for Warfarin Indication: atrial fibrillation  Allergies  Allergen Reactions   Penicillins     Unable to breathe    Amoxicillin-Pot Clavulanate     REACTION: throat swelling    Patient Measurements: Height: 6' (182.9 cm) Weight: 88.7 kg (195 lb 9.6 oz) IBW/kg (Calculated) : 77.6    Vital Signs: Temp: 97.9 F (36.6 C) (08/13 0436) Temp Source: Oral (08/13 0436) BP: 140/98 (08/13 0436) Pulse Rate: 106 (08/13 0436)  Labs: Recent Labs    03/23/23 0508 03/24/23 0516 03/25/23 0517  HGB 13.2 13.3  --   HCT 37.2* 38.6*  --   PLT 233 244  --   LABPROT 23.0* 24.5* 24.5*  INR 2.0* 2.2* 2.2*  CREATININE 1.20 1.16  --     Estimated Creatinine Clearance: 44.6 mL/min (by C-G formula based on SCr of 1.16 mg/dL).   Medications:  PTA Warfarin 2.5mg  daily except 3.75mg  Sun/Thur  Assessment:  89 yr male with generalized weakness and progressive confusion this past week.   CT Abdomen/pelvis with bladder wall thickening, trabeculation with severe diverticula compatible with chronic bladder outlet obstruction and prostate enlargement.  No previous history of BPH in the past.  PMH significant for HTN, AFib (on warfarin), colon cancer, TIA Patient reports LD of warfarin prior to admission = 03/16/23 INR on admission 8/5 = 3.6 (supratherapeutic) and 8/5 warfarin dose held  Today, 03/25/23  INR = 2.2 remains therapeutic 8/12 Hgb= 13.3 g/dL Plt: 44 K/uL, WNL, stable no reports of bleeding per RN  Goal of Therapy:  INR 2-3 Monitor platelets by anticoagulation protocol: Yes   Plan:  - resume PTA Warfarin dose of 2.5 mg daily except 3.75 mg Sun/Thur - change daily INR to TTS only  Herby Abraham, Pharm.D Use secure chat for questions 03/25/2023 10:45 AM

## 2023-03-25 NOTE — Progress Notes (Signed)
Nutrition Follow-up  DOCUMENTATION CODES:   Non-severe (moderate) malnutrition in context of acute illness/injury  INTERVENTION:   -Ensure Plus High Protein po TID, each supplement provides 350 kcal and 20 grams of protein.    -Multivitamin with minerals daily   NUTRITION DIAGNOSIS:   Moderate Malnutrition related to acute illness, lethargy/confusion as evidenced by mild fat depletion, mild muscle depletion, energy intake < 75% for > 7 days.  Ongoing.  GOAL:   Patient will meet greater than or equal to 90% of their needs  Not met.  MONITOR:   PO intake, Supplement acceptance, Labs, Weight trends, I & O's   ASSESSMENT:   89 y.o. male with past medical history significant for chronic diastolic congestive heart failure (LVEF 55-60%), HTN, HLD, permanent atrial fibrillation on warfarin, history of TIA, chronic low back pain, hemorrhoids who presented to [facility] ED on 8/5 via EMS from home with progressive weakness, confusion; ongoing x 1 week.  Patient currently consuming 25-100% of meals. Drinking Ensure.  Per chart review, pt expected to discharge to SNF with hospice.  Admission weight: 192 lbs Current weight: 195 lbs  Medications: Multivitamin with minerals daily  Labs reviewed: Low Na  Diet Order:   Diet Order             Diet regular Room service appropriate? Yes; Fluid consistency: Thin  Diet effective now                   EDUCATION NEEDS:   No education needs have been identified at this time  Skin:  Skin Assessment: Reviewed RN Assessment  Last BM:  8/11  Height:   Ht Readings from Last 1 Encounters:  03/17/23 6' (1.829 m)    Weight:   Wt Readings from Last 1 Encounters:  03/25/23 88.7 kg   BMI:  Body mass index is 26.53 kg/m.  Estimated Nutritional Needs:   Kcal:  1850-2150  Protein:  80-95g  Fluid:  1.8L/day   Tilda Franco, MS, RD, LDN Inpatient Clinical Dietitian Contact information available via Amion

## 2023-03-25 NOTE — Progress Notes (Signed)
Progress Note   Patient: Justin Gallegos ZOX:[id number] DOB: 1929/05/04 DOA: 03/16/2023     8 DOS: the patient was seen and examined on 03/25/2023   Brief hospital course: 89 y.o. male with past medical history significant for chronic diastolic congestive heart failure (LVEF 55-60%), HTN, HLD, permanent atrial fibrillation on warfarin, history of TIA, chronic low back pain, hemorrhoids who presented to [facility] ED on 8/5 via EMS from home with progressive weakness, confusion; ongoing x 1 week.  At baseline, able to ambulate at home however was too weak to stand when attempting to transfer to his bedside commode.  On EMS arrival, patient was found to be in atrial fibrillation with intermittent tachycardia.  He was given 300 cc of IV fluids and brought to the ED for further evaluation.   In the ED, temperature 97.8 F, HR 104, RR 18, BP 134/99, SpO2 97% on room air.  WBC 9.8, hemoglobin 14.1, platelet count 194.  Sodium 125, potassium 4.2, chloride 97, CO2 17, glucose 114, BUN 15, creatinine 1.48.  Magnesium 1.6.  AST 27, ALT 20, total bilirubin 1.0.  High sensitive troponin 13.  Lactic acid 1.8.  CT head obtained which did not show any acute intracranial process, noted remote right cerebral infraction. CT chest stand abdomen pelvis showed cardiomegaly, coronary disease, aortic atherosclerosis.  Bibasilar atelectasis.  Colonic diverticulosis.  Prostate enlargement.  Bladder wall thickening and trabeculation with severe diverticular compatible of chronic bladder outlet obstruction.  No acute finding. Patient received LR 500 bolus.  Magnesium 2 g and started on NS 75 cc/h. Called urology Dr.Machen regarding the bladder obstruction and BPH with recommendation for foley and start Flomax.   TRH consulted for admission for further evaluation and management of acute metabolic encephalopathy, hyponatremia.  Assessment and Plan: Hyponatremia Hypochloremia Patient presenting with sodium 125, chloride  97.  Complicated by home Lasix use.  CT head unremarkable except right cerebral hemisphere remote infarction.  Urine sodium 79.  Urine SG 1.017 within normal limits.  Etiology likely secondary to DHI hydration, poor oral intake as well as home Lasix use. -- Na now improved after IVF hydration. Holding further IVF for now. Encourage PO as tolerated -continue to follow bmet trends   Hypocalcemia Serum calcium 8.3, ionized calcium 1.10 on admission. S/p Calcium gluconate 1 g IV 8/5 -- corrected calcium unremarkable   Hypomagnesemia -replaced and within normal limits   Acute metabolic encephalopathy with underlying dementia: Improving Etiology likely secondary to poor oral intake, electrolyte disturbance.  Patient is afebrile without leukocytosis.  CT head without contrast with no acute intracranial hemorrhage or infarct, progressive parenchymal volume loss, remote right cerebellar hemisphere infarct, which is new since prior examination.  CT chest/abdomen/pelvis with no acute findings.  -- Pt does have hx dementia. Family did observe a sharp decline recently.  -Palliative Care following. DNR wishes noted. Plan for Palliative Care consultation at short-term rehab facility -TOC    Chronic bladder outlet obstruction BPH CT Abdomen/pelvis with bladder wall thickening, trabeculation with severe diverticula compatible with chronic bladder outlet obstruction and prostate enlargement.  No previous history of BPH in the past.  Admitting physician discussed with urology, Dr. Lafonda Mosses; recommended Foley catheter placement and initiation of Flomax. --Continue Foley catheter -- Flomax 0.4 mg p.o. daily -- 1.1L out overnight   Chronic/permanent atrial fibrillation on anticoagulation Essential hypertension Acutely decompensated Chronic diastolic congestive heart failure TTE 04/10/2020 with LVEF 55 to 60%, moderate LVH, no LV regional wall motion abnormalities, IVC normal in size,  no aortic stenosis. --  Metoprolol succinate 25 mg p.o. daily -- Cont coumadin with pharmacy to dose -- Cont to follow INR, currently 2.2 -pt became vol overloaded while receiving IVF for dehydration and required lasix -IVF now off and pt appears euvolemic   Hyperlipidemia Aortic atherosclerosis -- Atorvastatin -- Fenofibrate   CKD stage IIIb Creatinine baseline 1.5-1.8.  Creatinine 1.60 on admission, at baseline. -- Cr now stable -Cont to follow bmet trends   Weakness/debility/deconditioning/gait disturbance: Patient resides at home PTA -- PT/OT recs noted for SNF, TOC following      Subjective: No complaints this AM.  Physical Exam: Vitals:   03/24/23 2000 03/25/23 0436 03/25/23 0500 03/25/23 1231  BP: 134/75 (!) 140/98  (!) 146/89  Pulse: 81 (!) 106  (!) 108  Resp: 14 14  18   Temp: 97.8 F (36.6 C) 97.9 F (36.6 C)  97.6 F (36.4 C)  TempSrc: Oral Oral  Oral  SpO2: 100% 98%  94%  Weight:   88.7 kg   Height:       General exam: Conversant, in no acute distress Respiratory system: normal chest rise, clear, no audible wheezing Cardiovascular system: regular rhythm, s1-s2 Gastrointestinal system: Nondistended, nontender, pos BS Central nervous system: No seizures, no tremors Extremities: No cyanosis, no joint deformities Skin: No rashes, no pallor Psychiatry: Affect normal // no auditory hallucinations   Data Reviewed:  There are no new results to review at this time.  Family Communication: Pt in room, family not at bedside  Disposition: Status is: Inpatient Remains inpatient appropriate because: Severity of illness  Planned Discharge Destination: Skilled nursing facility    Author: Rickey Barbara, MD 03/25/2023 2:45 PM  For on call review [email or web address].

## 2023-03-25 NOTE — Care Management Important Message (Signed)
Important Message  Patient Details No IM Letter given due to [facility] in place for Discharge. Name: Justin Gallegos MRN: [id number] Date of Birth: 04-25-1929   Medicare Important Message Given:  No     Caren Macadam 03/25/2023, 2:37 PM

## 2023-03-25 NOTE — Plan of Care (Signed)
  Problem: Clinical Measurements: Goal: Will remain free from infection Outcome: Progressing Goal: Respiratory complications will improve Outcome: Progressing Goal: Cardiovascular complication will be avoided Outcome: Progressing   Problem: Nutrition: Goal: Adequate nutrition will be maintained Outcome: Progressing   Problem: Pain Managment: Goal: General experience of comfort will improve Outcome: Progressing   

## 2023-03-26 DIAGNOSIS — Z515 Encounter for palliative care: Secondary | ICD-10-CM

## 2023-03-26 DIAGNOSIS — R531 Weakness: Principal | ICD-10-CM

## 2023-03-26 DIAGNOSIS — E871 Hypo-osmolality and hyponatremia: Secondary | ICD-10-CM | POA: Diagnosis not present

## 2023-03-26 DIAGNOSIS — Z7189 Other specified counseling: Secondary | ICD-10-CM

## 2023-03-26 DIAGNOSIS — G9341 Metabolic encephalopathy: Secondary | ICD-10-CM | POA: Diagnosis not present

## 2023-03-26 DIAGNOSIS — R4589 Other symptoms and signs involving emotional state: Secondary | ICD-10-CM

## 2023-03-26 DIAGNOSIS — E440 Moderate protein-calorie malnutrition: Secondary | ICD-10-CM

## 2023-03-26 MED ORDER — TAMSULOSIN HCL 0.4 MG PO CAPS: 0.4000 mg | ORAL_CAPSULE | Freq: Every day | ORAL | Status: DC

## 2023-03-26 MED ORDER — METOPROLOL SUCCINATE ER 50 MG PO TB24: 50.0000 mg | ORAL_TABLET | Freq: Every day | ORAL | Status: DC

## 2023-03-26 NOTE — Progress Notes (Signed)
ANTICOAGULATION CONSULT NOTE  Pharmacy Consult for warfarin Indication: atrial fibrillation  Allergies  Allergen Reactions   Penicillins     Unable to breathe    Amoxicillin-Pot Clavulanate     REACTION: throat swelling    Patient Measurements: Height: 6' (182.9 cm) Weight: 88.4 kg (194 lb 12.8 oz) IBW/kg (Calculated) : 77.6   Vital Signs: Temp: 98.4 F (36.9 C) (08/14 0506) Temp Source: Oral (08/14 0506) BP: 154/88 (08/14 0506) Pulse Rate: 78 (08/14 0506)  Labs: Recent Labs    03/24/23 0516 03/25/23 0517 03/26/23 0505  HGB 13.3  --  12.6*  HCT 38.6*  --  36.6*  PLT 244  --  232  LABPROT 24.5* 24.5*  --   INR 2.2* 2.2*  --   CREATININE 1.16  --  1.19    Estimated Creatinine Clearance: 43.5 mL/min (by C-G formula based on SCr of 1.19 mg/dL).   Medications: Warfarin  -Home dose: Warfarin 2.5 mg daily except 3.75 mg Sun/Thur -Last dose PTA: 8/4 -INR goal per [facility] clinic note: 2.5-3. INR 2.9 on 6/19 visit  Assessment: Pt is a 89 yoM presenting on 8/4 with weakness and confusion. PMH significant for atrial fibrillation (on warfarin PTA), TIA. Pharmacy consulted to dose warfarin.  INR on admission was supratherapeutic. Warfarin dose held 8/5. Noted pt dehydrated on admission which could have contributed.   Today, 03/26/23  INR = 2.2 is therapeutic, but slightly below his clinic targeted goal of 2.5 -3 CBC has been stable. Last labs 8/12 Diet: Regular. 30% meal intake charted.  No major DDI  Goal of Therapy:  INR 2-3 (targeting 2.5 -3 outpatient) Monitor platelets by anticoagulation protocol: Yes   Plan:  Continue home dose of warfarin 2.5 mg PO daily, except 3.75 mg on Sundays and Thursdays.  INR and CBC on Tues, Thurs, Sat  Cindi Carbon, PharmD 03/26/23 9:36 AM

## 2023-03-26 NOTE — TOC Progression Note (Addendum)
Transition of Care [facility]) - Progression Note    Patient Details  Name: Justin Gallegos MRN: [id number] Date of Birth: April 18, 1929  Transition of Care [facility]) CM/SW Contact  , Olegario Messier, RN Phone Number: 03/26/2023, 8:56 AM  Clinical Narrative:  MD notified If stable for d/c I will need to start auth. Also last PT visit 8/12 will need visit prior auth.   -9:27a left vm w/[facility] rep [phone] to confirm patient chose facility & awaiting to initiate auth after PT visit-await call back.Stephanie(dtr) updated. -10:45a received call back from Christy-rep [facility]-accepted & bed available tomorrow if auth received.Await PT note,then initiate auth for [facility], & [facility]. -12:36p-left vm w/[facility] main tel#[phone] request to initiate auth for [facility], & [facility]-await call back. 1:45p-received call back from Juda for auth.    Expected Discharge Plan: Skilled Nursing Facility Barriers to Discharge: Continued Medical Work up  Expected Discharge Plan and Services   Discharge Planning Services: CM Consult   Living arrangements for the past 2 months: Single Family Home                                       Social Determinants of Health (SDOH) Interventions SDOH Screenings   Food Insecurity: No Food Insecurity (03/17/2023)  Housing: Low Risk  (03/17/2023)  Transportation Needs: No Transportation Needs (03/17/2023)  Utilities: Not At Risk (03/17/2023)  Alcohol Screen: Low Risk  (12/31/2022)  Depression (PHQ2-9): Low Risk  (12/31/2022)  Financial Resource Strain: Low Risk  (12/31/2022)  Physical Activity: Inactive (12/31/2022)  Social Connections: Moderately Integrated (12/31/2022)  Stress: No Stress Concern Present (12/31/2022)  Tobacco Use: Low Risk  (03/17/2023)    Readmission Risk Interventions     No data to display

## 2023-03-26 NOTE — Progress Notes (Signed)
Occupational Therapy Treatment Patient Details Name: Justin Gallegos MRN: [id number] DOB: [birth year] Today's Date: 03/26/2023   History of present illness 89 yr old male admitted with weakness and confusion. He was found to have hyponatremia and acute metabolic encephalopathy. PMH: HF, essential HTN, Afib, TIA, arthritis,  ureteral CA   OT comments  The pt presented with good effort and participation in the session, though he appeared to be with some fatigue. His spouse was present and stated the pt is weaker than typical for him and he may be more fatigued from sitting in the chair for a good amount of time today. He was assisted with bed mobility, upper body dressing seated EOB, and sit to stand using a RW. Once in standing, he took 1 lateral step toward the head of the bed using the RW, before he stated he needed to sit back down; he did not verbalize why he felt he needed to sit back down. Once back in bed, he performed simple self-feeding with set-up assist. Continue OT plan of care. Patient will benefit from continued inpatient follow up therapy, <3 hours/day.       If plan is discharge home, recommend the following:  A lot of help with walking and/or transfers;A lot of help with bathing/dressing/bathroom;Assistance with cooking/housework;Assist for transportation;Direct supervision/assist for medications management   Equipment Recommendations  Other (comment) (defer to next level of care)    Recommendations for Other Services      Precautions / Restrictions Precautions Precautions: Fall Restrictions Weight Bearing Restrictions: No       Mobility Bed Mobility Overal bed mobility: Needs Assistance Bed Mobility: Supine to Sit, Sit to Supine     Supine to sit: HOB elevated, Used rails, Mod assist Sit to supine: Min assist        Transfers Overall transfer level: Needs assistance Equipment used: Rolling walker (2 wheels) Transfers: Sit to/from Stand Sit to Stand: From  elevated surface, Mod assist, +2 safety/equipment           General transfer comment: He was instructed to demo improved trunk extension once in standing; he was further educated on taking lateral steps towards the head of the bed using a RW, for which he required increased verbal & tactile cues to lift BLE off the floor and to advance RW. He was only able to take 1 lateral step before needing to sit down         ADL either performed or assessed with clinical judgement   ADL Overall ADL's : Needs assistance/impaired Eating/Feeding: Set up;Sitting;Bed level   Grooming: Minimal assistance;Sitting Grooming Details (indicate cue type and reason): simulated seated in chair         Upper Body Dressing : Moderate assistance;Sitting Upper Body Dressing Details (indicate cue type and reason): He donned a hospital gown around his back seated EOB. Lower Body Dressing: Maximal assistance;Sitting/lateral leans       Toileting- Clothing Manipulation and Hygiene: Maximal assistance;Sit to/from stand Toileting - Clothing Manipulation Details (indicate cue type and reason): at bedside commode level, based on clinical judgement              Cognition Arousal:  (mildly lethargic)   Overall Cognitive Status: History of cognitive impairments - at baseline      Able to follow simple commands, though needing occasional repetition of prompts due to noted hearing impairment                   Pertinent Vitals/ Pain  Pain Assessment Pain Assessment: No/denies pain         Frequency  Min 1X/week        Progress Toward Goals  OT Goals(current goals can now be found in the care plan section)     Acute Rehab OT Goals OT Goal Formulation: With patient/family Time For Goal Achievement: 04/02/23 Potential to Achieve Goals: Good  Plan         AM-PAC OT "6 Clicks" Daily Activity     Outcome Measure   Help from another person eating meals?: A Little Help from another  person taking care of personal grooming?: A Little Help from another person toileting, which includes using toliet, bedpan, or urinal?: A Lot Help from another person bathing (including washing, rinsing, drying)?: A Lot Help from another person to put on and taking off regular upper body clothing?: A Lot Help from another person to put on and taking off regular lower body clothing?: A Lot 6 Click Score: 14    End of Session Equipment Utilized During Treatment: Gait belt;Rolling walker (2 wheels)  OT Visit Diagnosis: Unsteadiness on feet (R26.81);Muscle weakness (generalized) (M62.81)   Activity Tolerance Other (comment) (fair tolerance; pt appeared slightly fatigued)   Patient Left in bed;with call bell/phone within reach;with bed alarm set;with family/visitor present   Nurse Communication Mobility status        Time: 4696-2952 OT Time Calculation (min): 22 min  Charges: OT General Charges $OT Visit: 1 Visit OT Treatments $Therapeutic Activity: 8-22 mins    Reuben Likes, OTR/L 03/26/2023, 2:41 PM

## 2023-03-26 NOTE — Progress Notes (Addendum)
PROGRESS NOTE    Justin Gallegos  WUJ:[id number] DOB: 1928/08/21 DOA: 03/16/2023 PCP: Nelwyn Salisbury, MD   Brief Narrative:  89 year old with history of diastolic CHF EF 60%, HTN, HLD, permanent A-fib on Coumadin, TIA, chronic low back pain, hemorrhoids admitted to [facility] long ED on 8/5 due to progressive weakness and confusion.  Also had a brief episode of atrial fibrillation with RVR.  Upon admission CT of the head was negative, CT abdomen pelvis did not show any acute pathology besides chronic bladder outlet obstruction.  He was noted to have multiple electrolyte abnormality including hyponatremia, hypochloremia, hypocalcemia and hypomagnesemia.  Suspected secondary to dehydration which improved with IV fluids.  Eventually PT/OT recommended SNF therefore arrangements are being made.  Patient was also seen by palliative care service during hospitalization and patient was confirmed to be DNR with recommendations for outpatient palliative care to follow the patient.   Assessment & Plan:  Principal Problem:   Hyponatremia Active Problems:   Hypomagnesemia   Acute metabolic encephalopathy   BPH (benign prostatic hyperplasia)   Mild bibasilar atelectasis   Chronic diastolic CHF (congestive heart failure) (HCC)   Atrial fibrillation, chronic (HCC)   Metabolic acidosis   Hyperlipidemia   Essential hypertension   Atherosclerosis   Malnutrition of moderate degree    Hyponatremia/hypocalcemia/hypomagnesemia Hypochloremia This is secondary to dehydration from Lasix use and poor oral intake.  Appears to have improved with repletion    Acute metabolic encephalopathy with underlying dementia: Improving Secondary to poor oral intake.  CT of the head is negative, CT abdomen pelvis did not show any acute pathology.  Mentation is slowly improving.  Seen by palliative care services, patient is now DNR with recommendations for palliative care service to continue to follow-up outpatient.   Chronic  bladder outlet obstruction BPH CT Abdomen/pelvis with bladder wall thickening, trabeculation with severe diverticula compatible with chronic bladder outlet obstruction and prostate enlargement.  No previous history of BPH in the past.  Admitting physician discussed with urology, Dr. Lafonda Mosses; recommended Foley catheter placement and initiation of Flomax. And is to continue Foley catheter and Flomax.   Chronic/permanent atrial fibrillation on anticoagulation Essential hypertension Acutely decompensated Chronic diastolic congestive heart failure TTE 04/10/2020 with LVEF 55 to 60%, moderate LVH, no LV regional wall motion abnormalities, IVC normal in size, no aortic stenosis.  Currently on Toprol-XL, continue Coumadin with goal INR 2.0-3.0.   Hyperlipidemia Aortic atherosclerosis Continue statin and fenofibrate   CKD stage IIIb Creatinine around baseline now of 1.1   Weakness/debility/deconditioning/gait disturbance: Generalized weakness prior to arrival.  Resides at home prior to hospitalization.  Now PT/OT is recommending SNF therefore arrangements be made by TOC.   DVT prophylaxis: Coumadin Code Status: DNR or Family Communication:   Status is: Inpatient Remains inpatient appropriate because: Awaiting SNF placement. Patient is medically stable from my standpoint.        Diet Orders (From admission, onward)     Start     Ordered   03/24/23 1302  Diet regular Room service appropriate? Yes; Fluid consistency: Thin  Diet effective now       Question Answer Comment  Room service appropriate? Yes   Fluid consistency: Thin      03/24/23 1302            Subjective: Sitting up on his chair, no complaints.    Examination:  General exam: Appears calm and comfortable  Respiratory system: Clear to auscultation. Respiratory effort normal. Cardiovascular system: S1 & S2 heard, RRR.  No JVD, murmurs, rubs, gallops or clicks. No pedal edema. Gastrointestinal system: Abdomen is  nondistended, soft and nontender. No organomegaly or masses felt. Normal bowel sounds heard. Central nervous system: Alert and oriented. No focal neurological deficits. Extremities: Symmetric 4 x 5 power. Skin: No rashes, lesions or ulcers Psychiatry: Judgement and insight appear poor  Objective: Vitals:   03/25/23 1231 03/25/23 1957 03/26/23 0500 03/26/23 0506  BP: (!) 146/89 135/86  (!) 154/88  Pulse: (!) 108 72  78  Resp: 18 14    Temp: 97.6 F (36.4 C) 97.7 F (36.5 C)  98.4 F (36.9 C)  TempSrc: Oral Oral  Oral  SpO2: 94% 99%  97%  Weight:   88.4 kg   Height:        Intake/Output Summary (Last 24 hours) at 03/26/2023 0838 Last data filed at 03/26/2023 0600 Gross per 24 hour  Intake 50 ml  Output 1000 ml  Net -950 ml   Filed Weights   03/23/23 0500 03/25/23 0500 03/26/23 0500  Weight: 86 kg 88.7 kg 88.4 kg    Scheduled Meds:  atorvastatin  40 mg Oral Daily   carbamide peroxide  5 drop Both EARS BID   Chlorhexidine Gluconate Cloth  6 each Topical Daily   ezetimibe  10 mg Oral Daily   feeding supplement  237 mL Oral TID BM   fenofibrate  54 mg Oral Daily   metoprolol succinate  50 mg Oral Daily   multivitamin with minerals  1 tablet Oral Daily   mouth rinse  15 mL Mouth Rinse 4 times per day   sodium chloride flush  3 mL Intravenous Q12H   tamsulosin  0.4 mg Oral Daily   [START ON 03/27/2023] warfarin  3.75 mg Oral Once per day on Sunday Thursday   And   warfarin  2.5 mg Oral Once per day on Monday Tuesday Wednesday Friday Saturday   Warfarin - Pharmacist Dosing Inpatient   Does not apply q1600   Continuous Infusions:  sodium chloride      Nutritional status Signs/Symptoms: mild fat depletion, mild muscle depletion, energy intake < 75% for > 7 days Interventions: Ensure Enlive (each supplement provides 350kcal and 20 grams of protein), MVI Body mass index is 26.42 kg/m.  Data Reviewed:   CBC: Recent Labs  Lab 03/21/23 0453 03/22/23 0516 03/23/23 0508  03/24/23 0516 03/26/23 0505  WBC 5.8 5.6 5.5 5.3 5.6  HGB 12.2* 12.2* 13.2 13.3 12.6*  HCT 36.3* 35.8* 37.2* 38.6* 36.6*  MCV 97.3 94.0 91.6 92.1 93.4  PLT 184 188 233 244 232   Basic Metabolic Panel: Recent Labs  Lab 03/21/23 0906 03/22/23 0516 03/23/23 0508 03/24/23 0516 03/26/23 0505  NA 130* 130* 130* 130* 129*  K 4.0 3.7 3.5 3.9 4.0  CL 106 101 96* 96* 99  CO2 17* 19* 24 27 22   GLUCOSE 91 102* 117* 122* 108*  BUN 16 16 18 18 20   CREATININE 1.10 1.22 1.20 1.16 1.19  CALCIUM 8.2* 8.4* 8.9 8.9 8.7*   GFR: Estimated Creatinine Clearance: 43.5 mL/min (by C-G formula based on SCr of 1.19 mg/dL). Liver Function Tests: Recent Labs  Lab 03/21/23 0906 03/22/23 0516 03/23/23 0508 03/24/23 0516 03/26/23 0505  AST 22 25 31  33 26  ALT 19 22 24 27 24   ALKPHOS 56 59 63 74 61  BILITOT 0.6 1.1 0.9 0.7 0.6  PROT 5.0* 5.4* 5.8* 5.6* 5.1*  ALBUMIN 2.4* 2.9* 3.0* 2.9* 2.7*   No results for input(s): "  LIPASE", "AMYLASE" in the last 168 hours. No results for input(s): "AMMONIA" in the last 168 hours. Coagulation Profile: Recent Labs  Lab 03/21/23 0453 03/22/23 0516 03/23/23 0508 03/24/23 0516 03/25/23 0517  INR 2.5* 2.4* 2.0* 2.2* 2.2*   Cardiac Enzymes: No results for input(s): "CKTOTAL", "CKMB", "CKMBINDEX", "TROPONINI" in the last 168 hours. BNP (last 3 results) No results for input(s): "PROBNP" in the last 8760 hours. HbA1C: No results for input(s): "HGBA1C" in the last 72 hours. CBG: No results for input(s): "GLUCAP" in the last 168 hours. Lipid Profile: No results for input(s): "CHOL", "HDL", "LDLCALC", "TRIG", "CHOLHDL", "LDLDIRECT" in the last 72 hours. Thyroid Function Tests: No results for input(s): "TSH", "T4TOTAL", "FREET4", "T3FREE", "THYROIDAB" in the last 72 hours. Anemia Panel: No results for input(s): "VITAMINB12", "FOLATE", "FERRITIN", "TIBC", "IRON", "RETICCTPCT" in the last 72 hours. Sepsis Labs: No results for input(s): "PROCALCITON",  "LATICACIDVEN" in the last 168 hours.  Recent Results (from the past 240 hour(s))  Blood Culture (routine x 2)     Status: Abnormal   Collection Time: 03/17/23 12:15 AM   Specimen: BLOOD  Result Value Ref Range Status   Specimen Description   Final    BLOOD LEFT ANTECUBITAL Performed at [facility], [address] [place], [place] [address]    Special Requests   Final    Blood Culture adequate volume BOTTLES DRAWN AEROBIC AND ANAEROBIC Performed at [facility], [address], [place], [place] [address]    Culture  Setup Time   Final    GRAM POSITIVE COCCI AEROBIC BOTTLE ONLY CRITICAL RESULT CALLED TO, READ BACK BY AND VERIFIED WITH: PHARMD D.WOFFORD [id number] @ 2039 FH    Culture (A)  Final    MICROCOCCUS LUTEUS/LYLAE Standardized susceptibility testing for this organism is not available. Performed at [facility], [address], [place], [place] [address]    Report Status 03/19/2023 FINAL  Final  Blood Culture (routine x 2)     Status: None   Collection Time: 03/17/23 12:15 AM   Specimen: BLOOD  Result Value Ref Range Status   Specimen Description   Final    BLOOD BLOOD LEFT ARM Performed at [facility], [address], [place], [place] [address]    Special Requests   Final    Blood Culture adequate volume BOTTLES DRAWN AEROBIC AND ANAEROBIC Performed at [facility], [address], [place], [place] [address]    Culture   Final    NO GROWTH 5 DAYS Performed at [facility], [address], [place], [place] [address]    Report Status 03/22/2023 FINAL  Final  Blood Culture ID Panel (Reflexed)     Status: None   Collection Time: 03/17/23 12:15 AM  Result Value Ref Range Status   Enterococcus faecalis NOT DETECTED NOT DETECTED Final   Enterococcus Faecium NOT DETECTED NOT DETECTED Final   Listeria monocytogenes NOT DETECTED NOT DETECTED Final   Staphylococcus species NOT DETECTED  NOT DETECTED Final   Staphylococcus aureus (BCID) NOT DETECTED NOT DETECTED Final   Staphylococcus epidermidis NOT DETECTED NOT DETECTED Final   Staphylococcus lugdunensis NOT DETECTED NOT DETECTED Final   Streptococcus species NOT DETECTED NOT DETECTED Final   Streptococcus agalactiae NOT DETECTED NOT DETECTED Final   Streptococcus pneumoniae NOT DETECTED NOT DETECTED Final   Streptococcus pyogenes NOT DETECTED NOT DETECTED Final   A.calcoaceticus-baumannii NOT DETECTED NOT DETECTED Final   Bacteroides fragilis NOT DETECTED NOT DETECTED Final  Enterobacterales NOT DETECTED NOT DETECTED Final   Enterobacter cloacae complex NOT DETECTED NOT DETECTED Final   Escherichia coli NOT DETECTED NOT DETECTED Final   Klebsiella aerogenes NOT DETECTED NOT DETECTED Final   Klebsiella oxytoca NOT DETECTED NOT DETECTED Final   Klebsiella pneumoniae NOT DETECTED NOT DETECTED Final   Proteus species NOT DETECTED NOT DETECTED Final   Salmonella species NOT DETECTED NOT DETECTED Final   Serratia marcescens NOT DETECTED NOT DETECTED Final   Haemophilus influenzae NOT DETECTED NOT DETECTED Final   Neisseria meningitidis NOT DETECTED NOT DETECTED Final   Pseudomonas aeruginosa NOT DETECTED NOT DETECTED Final   Stenotrophomonas maltophilia NOT DETECTED NOT DETECTED Final   Candida albicans NOT DETECTED NOT DETECTED Final   Candida auris NOT DETECTED NOT DETECTED Final   Candida glabrata NOT DETECTED NOT DETECTED Final   Candida krusei NOT DETECTED NOT DETECTED Final   Candida parapsilosis NOT DETECTED NOT DETECTED Final   Candida tropicalis NOT DETECTED NOT DETECTED Final   Cryptococcus neoformans/gattii NOT DETECTED NOT DETECTED Final    Comment: Performed at [facility], [address] [place], [place] [address]         Radiology Studies: No results found.         LOS: 9 days   Time spent= 35 mins     Joline Maxcy, MD [facility]  If 7PM-7AM, please contact  night-coverage  03/26/2023, 8:38 AM

## 2023-03-26 NOTE — Progress Notes (Signed)
Physical Therapy Treatment Patient Details Name: Justin Gallegos MRN: [id number] DOB: [birth year] Today's Date: 03/26/2023   History of Present Illness 89 yr old male admitted with weakness and confusion. He was found to have hyponatremia and acute metabolic encephalopathy. PMH: HF, essential HTN, Afib, TIA, arthritis,  ureteral CA    PT Comments  Pt agreeable to working with therapy. Not particularly conversational-he does respond to questions appropriately when he can hear you clearly. Max A to stand from recliner. Max A for pre-gait marching and a couple shuffle steps forward before pt needed to sit down. He reported feeling tired and sleepy when asked. No family present during session. Patient will benefit from continued inpatient follow up therapy, <3 hours/day     If plan is discharge home, recommend the following: Assistance with cooking/housework;Assist for transportation;Help with stairs or ramp for entrance;A lot of help with walking and/or transfers;A lot of help with bathing/dressing/bathroom   Can travel by private vehicle     No  Equipment Recommendations  Rolling walker (2 wheels)    Recommendations for Other Services OT consult     Precautions / Restrictions Precautions Precautions: Fall Restrictions Weight Bearing Restrictions: No     Mobility  Bed Mobility               General bed mobility comments: oob in recliner    Transfers Overall transfer level: Needs assistance Equipment used: Rolling walker (2 wheels) Transfers: Sit to/from Stand Sit to Stand: Max assist           General transfer comment: Max A to power up from recliner. Increased time. Multimodal cuein required. Assist to maintain balance once standing. HIgh fall risk    Ambulation/Gait Ambulation/Gait assistance: Max assist Gait Distance (Feet): 2 Feet Assistive device: Rolling walker (2 wheels) Gait Pattern/deviations: Shuffle       General Gait Details: Pre-gait marching x 2  reps on each foot. Pt took 2 very small shuffling steps forward before gesturing to sit back down (reaching back for chair). High fall risk. Recliner close behind.   Stairs             Wheelchair Mobility     Tilt Bed    Modified Rankin (Stroke Patients Only)       Balance Overall balance assessment: Needs assistance         Standing balance support: Bilateral upper extremity supported, Reliant on assistive device for balance Standing balance-Leahy Scale: Poor                              Cognition Arousal: Alert Behavior During Therapy: WFL for tasks assessed/performed Overall Cognitive Status: History of cognitive impairments - at baseline                                 General Comments: He is very HOH making cognitive assessment difficult. He did follow basic commands with increased time and was able to express needs.        Exercises General Exercises - Lower Extremity Ankle Circles/Pumps: AROM, Both, 10 reps Long Arc Quad: AROM, Both, 5 reps, Seated Hip Flexion/Marching: AROM, Both, 5 reps, Seated    General Comments        Pertinent Vitals/Pain Pain Assessment Pain Assessment: Faces Faces Pain Scale: No hurt    Home Living  Prior Function            PT Goals (current goals can now be found in the care plan section) Progress towards PT goals: Progressing toward goals    Frequency    Min 1X/week      PT Plan      Co-evaluation              AM-PAC PT "6 Clicks" Mobility   Outcome Measure  Help needed turning from your back to your side while in a flat bed without using bedrails?: A Lot Help needed moving from lying on your back to sitting on the side of a flat bed without using bedrails?: A Lot Help needed moving to and from a bed to a chair (including a wheelchair)?: A Lot Help needed standing up from a chair using your arms (e.g., wheelchair or bedside chair)?: A  Lot Help needed to walk in hospital room?: Total Help needed climbing 3-5 steps with a railing? : Total 6 Click Score: 10    End of Session Equipment Utilized During Treatment: Gait belt Activity Tolerance: Patient tolerated treatment well;Patient limited by fatigue Patient left: in chair;with call bell/phone within reach;with chair alarm set   PT Visit Diagnosis: Muscle weakness (generalized) (M62.81);Difficulty in walking, not elsewhere classified (R26.2)     Time: 8413-2440 PT Time Calculation (min) (ACUTE ONLY): 15 min  Charges:    $Gait Training: 8-22 mins PT General Charges $$ ACUTE PT VISIT: 1 Visit                         Faye Ramsay, PT Acute Rehabilitation  Office: [phone]

## 2023-03-26 NOTE — Progress Notes (Addendum)
Daily Progress Note   Patient Name: Justin Gallegos       Date: 03/26/2023 DOB: 09-Mar-1929  Age: 89 y.o. MRN#: [id number] Attending Physician: Dimple Nanas, MD Primary Care Physician: Nelwyn Salisbury, MD Admit Date: 03/16/2023 Length of Stay: 9 days  Reason for Consultation/Follow-up: Establishing goals of care  Subjective:   CC: Patient wanting to rest now. Spoke with wife at bedside. Following up regarding complex medical decision making.   Subjective:  Reviewed EMR to presenting to bedside.  Patient continues to work with physical therapy.  Planning for discharge to rehab.  TOC assisting with coordination.  [facility] palliative medicine team has already been informed of palliative medicine referral as per note on 03/24/2023.  Patient stable for discharge from medical perspective.  Presented to bedside to check on patient.  Patient laying comfortably in bed.  Patient noting he is tired and wanting to sleep at this time.  Patient's wife present at bedside.  Introduced myself as a member of the palliative medicine team.  Wife updated that patient had been sitting in the bedside chair all day and then was tired because physical therapy came to work with him as well.  Wife denied any concerns regarding symptom management. Wife had questions regarding rehab placement and timing as she would like her daughter involved in care coordination.  Noted would defer this conversation to TOC. Spent time providing emotional support via active listening.  Discussed importance of continued conversations with outpatient palliative medicine team.   Thanked wife for allowing me to visit with her and her husband today.  All questions answered at that time.  Noted palliative medicine team would be available if needed.  Review of Systems No concerns noted Objective:   Vital Signs:  BP 122/67 (BP Location: Left Arm)   Pulse 91   Temp 97.6 F (36.4 C) (Oral)   Resp 14   Ht 6' (1.829 m)   Wt 88.4 kg    SpO2 98%   BMI 26.42 kg/m   Physical Exam: General: NAD, laying in bed, wanting to sleep, hard of hearing Eyes: No drainage noted Cardiovascular: RRR Respiratory: no increased work of breathing noted, not in respiratory distress  Imaging:  I personally reviewed recent imaging.   Assessment & Plan:   Assessment: Patient is a 89 year old male with a past medical history of chronic bladder outlet obstruction secondary to BPH, chronic A-fib, CHF, CKD, dyslipidemia, and dementia who was admitted on 03/16/2023 for management of worsening medical status including metabolic encephalopathy, bladder outlet obstruction, atrial fibrillation management, and overall deconditioning.  Palliative medicine team consulted to assist with complex medical decision making.  Recommendations/Plan: # Complex medical decision making/goals of care:  - Patient resting comfortably in bed today.  Discussed care with wife at bedside.  Planning for patient to discharge to rehab to work with PT/OT.  Discussed with wife importance of continuing conversations regarding medical care moving forward with outpatient palliative medicine team to which referral has already been made to [facility].  -  Code Status: DNR  # Symptom management:  -As per primary hospitalist  # Psychosocial Support:  -Wife, daughter  # Discharge Planning: Skilled Nursing Facility for rehab with Palliative care service follow-up via [facility]  Discussed with: patient's wife, patient, RN, hosptialist, TOC  Thank you for allowing the palliative care team to participate in the care Stacy Gardner.  Alvester Morin, DO Palliative Care Provider PMT # [phone]  If patient remains symptomatic despite maximum doses, please call  PMT at [phone] between 0700 and 1900. Outside of these hours, please call attending, as PMT does not have night coverage.  *Please note that this is a verbal dictation therefore any spelling or grammatical errors are due to  the "[employer]" system interpretation.

## 2023-03-27 DIAGNOSIS — I952 Hypotension due to drugs: Secondary | ICD-10-CM | POA: Diagnosis not present

## 2023-03-27 DIAGNOSIS — N1832 Chronic kidney disease, stage 3b: Secondary | ICD-10-CM | POA: Diagnosis not present

## 2023-03-27 DIAGNOSIS — I480 Paroxysmal atrial fibrillation: Secondary | ICD-10-CM | POA: Diagnosis not present

## 2023-03-27 DIAGNOSIS — I10 Essential (primary) hypertension: Secondary | ICD-10-CM | POA: Diagnosis not present

## 2023-03-27 DIAGNOSIS — G9341 Metabolic encephalopathy: Secondary | ICD-10-CM | POA: Diagnosis not present

## 2023-03-27 DIAGNOSIS — G309 Alzheimer's disease, unspecified: Secondary | ICD-10-CM | POA: Diagnosis not present

## 2023-03-27 DIAGNOSIS — E559 Vitamin D deficiency, unspecified: Secondary | ICD-10-CM | POA: Diagnosis not present

## 2023-03-27 DIAGNOSIS — E785 Hyperlipidemia, unspecified: Secondary | ICD-10-CM | POA: Diagnosis not present

## 2023-03-27 DIAGNOSIS — E440 Moderate protein-calorie malnutrition: Secondary | ICD-10-CM | POA: Diagnosis not present

## 2023-03-27 DIAGNOSIS — R5381 Other malaise: Secondary | ICD-10-CM | POA: Diagnosis not present

## 2023-03-27 DIAGNOSIS — I5022 Chronic systolic (congestive) heart failure: Secondary | ICD-10-CM | POA: Diagnosis not present

## 2023-03-27 DIAGNOSIS — Z7401 Bed confinement status: Secondary | ICD-10-CM | POA: Diagnosis not present

## 2023-03-27 DIAGNOSIS — Z8673 Personal history of transient ischemic attack (TIA), and cerebral infarction without residual deficits: Secondary | ICD-10-CM | POA: Diagnosis not present

## 2023-03-27 DIAGNOSIS — R531 Weakness: Secondary | ICD-10-CM | POA: Diagnosis not present

## 2023-03-27 DIAGNOSIS — I4891 Unspecified atrial fibrillation: Secondary | ICD-10-CM | POA: Diagnosis not present

## 2023-03-27 DIAGNOSIS — M549 Dorsalgia, unspecified: Secondary | ICD-10-CM | POA: Diagnosis not present

## 2023-03-27 DIAGNOSIS — I130 Hypertensive heart and chronic kidney disease with heart failure and stage 1 through stage 4 chronic kidney disease, or unspecified chronic kidney disease: Secondary | ICD-10-CM | POA: Diagnosis not present

## 2023-03-27 DIAGNOSIS — E871 Hypo-osmolality and hyponatremia: Secondary | ICD-10-CM | POA: Diagnosis not present

## 2023-03-27 DIAGNOSIS — N401 Enlarged prostate with lower urinary tract symptoms: Secondary | ICD-10-CM | POA: Diagnosis not present

## 2023-03-27 DIAGNOSIS — I509 Heart failure, unspecified: Secondary | ICD-10-CM | POA: Diagnosis not present

## 2023-03-27 DIAGNOSIS — Z466 Encounter for fitting and adjustment of urinary device: Secondary | ICD-10-CM | POA: Diagnosis not present

## 2023-03-27 DIAGNOSIS — E781 Pure hyperglyceridemia: Secondary | ICD-10-CM | POA: Diagnosis not present

## 2023-03-27 LAB — CBC
HCT: 36.6 % — ABNORMAL LOW (ref 39.0–52.0)
Hemoglobin: 12.4 g/dL — ABNORMAL LOW (ref 13.0–17.0)
MCH: 31.6 pg (ref 26.0–34.0)
MCHC: 33.9 g/dL (ref 30.0–36.0)
MCV: 93.4 fL (ref 80.0–100.0)
Platelets: 237 10*3/uL (ref 150–400)
RBC: 3.92 MIL/uL — ABNORMAL LOW (ref 4.22–5.81)
RDW: 13.2 % (ref 11.5–15.5)
WBC: 6.1 10*3/uL (ref 4.0–10.5)
nRBC: 0 % (ref 0.0–0.2)

## 2023-03-27 LAB — PROTIME-INR
INR: 2.2 — ABNORMAL HIGH (ref 0.8–1.2)
Prothrombin Time: 24.7 s — ABNORMAL HIGH (ref 11.4–15.2)

## 2023-03-27 NOTE — Discharge Summary (Signed)
Physician Discharge Summary  Justin Gallegos GEX:[id number] DOB: 07-Jul-1929 DOA: 03/16/2023  PCP: Nelwyn Salisbury, MD  Admit date: 03/16/2023 Discharge date: 03/27/2023  Admitted From: Home Disposition:  SNF  Recommendations for Outpatient Follow-up:  Follow up with PCP in 1-2 weeks Please obtain BMP/CBC in one week your next doctors visit.  Outpatient Palliative Care follow up Resume home Coumadin.  Outpatient Urology follow up with Dr Lafonda Mosses, cont Foley care.  Flomax started.    Discharge Condition: Stable CODE STATUS: DNR Diet recommendation: Cardiac.   Brief/Interim Summary:  Brief Narrative:  89 year old with history of diastolic CHF EF 60%, HTN, HLD, permanent A-fib on Coumadin, TIA, chronic low back pain, hemorrhoids admitted to [facility] long ED on 8/5 due to progressive weakness and confusion.  Also had a brief episode of atrial fibrillation with RVR.  Upon admission CT of the head was negative, CT abdomen pelvis did not show any acute pathology besides chronic bladder outlet obstruction.  He was noted to have multiple electrolyte abnormality including hyponatremia, hypochloremia, hypocalcemia and hypomagnesemia.  Suspected secondary to dehydration which improved with IV fluids.  PT OT recommended SNF, palliative care service to continue to follow at SNF as well.    Medically stable for dc today.    Assessment & Plan:  Principal Problem:   Hyponatremia Active Problems:   Hypomagnesemia   Acute metabolic encephalopathy   BPH (benign prostatic hyperplasia)   Mild bibasilar atelectasis   Chronic diastolic CHF (congestive heart failure) (HCC)   Atrial fibrillation, chronic (HCC)   Metabolic acidosis   Hyperlipidemia   Essential hypertension   Atherosclerosis   Malnutrition of moderate degree     Hyponatremia/hypocalcemia/hypomagnesemia Hypochloremia This is secondary to dehydration from Lasix use and poor oral intake.  Appears to have improved with repletion.  Currently  sodium level remains stable around 129    Acute metabolic encephalopathy with underlying dementia: Improving Secondary to poor oral intake.  CT of the head is negative, CT abdomen pelvis did not show any acute pathology.  Mentation is slowly improving.  Seen by palliative care services, patient is now DNR with recommendations for palliative care service to continue to follow-up outpatient.   Chronic bladder outlet obstruction BPH CT Abdomen/pelvis with bladder wall thickening, trabeculation with severe diverticula compatible with chronic bladder outlet obstruction and prostate enlargement.  No previous history of BPH in the past.  Admitting physician discussed with urology, Dr. Lafonda Mosses; recommended Foley catheter placement and initiation of Flomax.  Will require outpatient follow-up   Chronic/permanent atrial fibrillation on anticoagulation Essential hypertension Acutely decompensated Chronic diastolic congestive heart failure TTE 04/10/2020 with LVEF 55 to 60%, moderate LVH, no LV regional wall motion abnormalities, IVC normal in size, no aortic stenosis.  Currently on Toprol-XL, continue Coumadin with goal INR 2.0-3.0. Home coumadin dose: 2.5 mg PO daily, except 3.75 mg on Sundays and Thursdays. INR checks on Tues, Thurs & Sat.   Hyperlipidemia Aortic atherosclerosis Continue statin and fenofibrate   CKD stage IIIb Creatinine around baseline now of 1.1   Weakness/debility/deconditioning/gait disturbance: Generalized weakness prior to arrival.  Resides at home prior to hospitalization.  Now PT/OT is recommending SNF therefore arrangements be made by TOC.      Discharge Diagnoses:  Principal Problem:   Hyponatremia Active Problems:   Hypomagnesemia   Acute metabolic encephalopathy   BPH (benign prostatic hyperplasia)   Mild bibasilar atelectasis   Chronic diastolic CHF (congestive heart failure) (HCC)   Atrial fibrillation, chronic (HCC)   Metabolic acidosis  Hyperlipidemia    Essential hypertension   Atherosclerosis   Malnutrition of moderate degree   Generalized weakness   Need for emotional support   Goals of care, counseling/discussion   Palliative care encounter      Consultations: Urology Palliative Care  Subjective: Doing ok no complaints.   Discharge Exam: Vitals:   03/27/23 0603 03/27/23 1254  BP: (!) 164/98 112/84  Pulse: 94 92  Resp: 18 18  Temp: 97.9 F (36.6 C) 98.2 F (36.8 C)  SpO2: 99% 98%   Vitals:   03/26/23 1934 03/27/23 0603 03/27/23 0610 03/27/23 1254  BP: 125/78 (!) 164/98  112/84  Pulse: 88 94  92  Resp: 17 18  18   Temp: 97.7 F (36.5 C) 97.9 F (36.6 C)  98.2 F (36.8 C)  TempSrc: Oral Oral  Oral  SpO2: 99% 99%  98%  Weight:   89.2 kg   Height:         Discharge Instructions   Allergies as of 03/27/2023       Reactions   Penicillins    Unable to breathe    Amoxicillin-pot Clavulanate    REACTION: throat swelling        Medication List     STOP taking these medications    furosemide 20 MG tablet Commonly known as: LASIX       TAKE these medications    acetaminophen 500 MG tablet Commonly known as: TYLENOL Take 500 mg by mouth every 6 (six) hours as needed for moderate pain.   atorvastatin 40 MG tablet Commonly known as: LIPITOR Take 1 tablet by mouth once daily   EYLEA IO Inject into the eye.   ezetimibe 10 MG tablet Commonly known as: ZETIA TAKE 1 TABLET BY MOUTH ONCE DAILY PHYSICAL  WITH  LABS  REQUIRED  FOR  GREATER  QUANTITY  OF  REFILLS   fenofibrate 145 MG tablet Commonly known as: TRICOR Take 1 tablet (145 mg total) by mouth daily.   ketoconazole 2 % cream Commonly known as: NIZORAL APPLY TOPICALLY DAILY   metoprolol succinate 50 MG 24 hr tablet Commonly known as: TOPROL-XL Take 1 tablet (50 mg total) by mouth daily. Take with or immediately following a meal. What changed:  medication strength how much to take additional instructions   tamsulosin 0.4 MG Caps  capsule Commonly known as: FLOMAX Take 1 capsule (0.4 mg total) by mouth daily.   tobramycin 0.3 % ophthalmic solution Commonly known as: TOBREX Place 1 drop into both eyes as directed. INSTILL 1 DROP INTO RIGHT EYE 4 TIMES DAILY; BEGIN 1 DAY PRIOR TO TREATMENT, CONTINUE THE DAY OF TREATMENT AND ONE DAY AFTER TREATMENT.   triamcinolone cream 0.1 % Commonly known as: KENALOG Apply 1 Application topically 2 (two) times daily.   Vitamin D 125 MCG (5000 UT) Caps Take 5,000 Units by mouth daily.   warfarin 1 MG tablet Commonly known as: COUMADIN Take as directed. If you are unsure how to take this medication, talk to your nurse or doctor. Original instructions: TAKE ONE TABLET BY MOUTH DAILY OR AS DIRECTED BY ANTICOAGULATION CLINIC What changed: Another medication with the same name was changed. Make sure you understand how and when to take each.   warfarin 2.5 MG tablet Commonly known as: COUMADIN Take as directed. If you are unsure how to take this medication, talk to your nurse or doctor. Original instructions: TAKE 1 TABLET BY MOUTH ONCE DAILY EXCEPT TAKE 1 AND 1/2 ON SUNDAYS, WEDNESDAYS AND FRIDAYS  OR AS DIRECTED BY ANTICOAGULATION CLINIC What changed:  how much to take how to take this when to take this additional instructions        Contact information for follow-up providers     Nelwyn Salisbury, MD Follow up in 1 week(s).   Specialty: Family Medicine Contact information: [address] [place] [address] [phone]              Contact information for after-discharge care     Destination     [facility] .   Service: Skilled Nursing Contact information: [address] [place] [address] [phone]                    Allergies  Allergen Reactions   Penicillins     Unable to breathe    Amoxicillin-Pot Clavulanate     REACTION: throat swelling    You were cared for by a  hospitalist during your hospital stay. If you have any questions about your discharge medications or the care you received while you were in the hospital after you are discharged, you can call the unit and asked to speak with the hospitalist on call if the hospitalist that took care of you is not available. Once you are discharged, your primary care physician will handle any further medical issues. Please note that no refills for any discharge medications will be authorized once you are discharged, as it is imperative that you return to your primary care physician (or establish a relationship with a primary care physician if you do not have one) for your aftercare needs so that they can reassess your need for medications and monitor your lab values.  You were cared for by a hospitalist during your hospital stay. If you have any questions about your discharge medications or the care you received while you were in the hospital after you are discharged, you can call the unit and asked to speak with the hospitalist on call if the hospitalist that took care of you is not available. Once you are discharged, your primary care physician will handle any further medical issues. Please note that NO REFILLS for any discharge medications will be authorized once you are discharged, as it is imperative that you return to your primary care physician (or establish a relationship with a primary care physician if you do not have one) for your aftercare needs so that they can reassess your need for medications and monitor your lab values.  Please request your Prim.MD to go over all Hospital Tests and Procedure/Radiological results at the follow up, please get all Hospital records sent to your Prim MD by signing hospital release before you go home.  Get CBC, CMP, 2 view Chest X ray checked  by Primary MD during your next visit or SNF MD in 5-7 days ( we routinely change or add medications that can affect your baseline labs and  fluid status, therefore we recommend that you get the mentioned basic workup next visit with your PCP, your PCP may decide not to get them or add new tests based on their clinical decision)  On your next visit with your primary care physician please Get Medicines reviewed and adjusted.  If you experience worsening of your admission symptoms, develop shortness of breath, life threatening emergency, suicidal or homicidal thoughts you must seek medical attention immediately by calling 911 or calling your MD immediately  if symptoms less severe.  You Must read  complete instructions/literature along with all the possible adverse reactions/side effects for all the Medicines you take and that have been prescribed to you. Take any new Medicines after you have completely understood and accpet all the possible adverse reactions/side effects.   Do not drive, operate heavy machinery, perform activities at heights, swimming or participation in water activities or provide baby sitting services if your were admitted for syncope or siezures until you have seen by Primary MD or a Neurologist and advised to do so again.  Do not drive when taking Pain medications.   Procedures/Studies: DG CHEST PORT 1 VIEW  Result Date: 03/22/2023 CLINICAL DATA:  Congestive heart failure. EXAM: PORTABLE CHEST 1 VIEW COMPARISON:  Two-view chest x-ray 02/10/2013 FINDINGS: The heart is enlarged. Atherosclerotic calcifications are better demonstrated at the aortic arch. Small effusions are present bilaterally. Mild bibasilar airspace opacities likely reflect atelectasis. Lungs are otherwise clear. No edema is present. IMPRESSION: 1. Cardiomegaly without failure. 2. Small bilateral pleural effusions and mild bibasilar atelectasis. Electronically Signed   By: Marin Roberts M.D.   On: 03/22/2023 16:24   CT CHEST ABDOMEN PELVIS WO CONTRAST  Result Date: 03/17/2023 CLINICAL DATA:  Sepsis EXAM: CT CHEST, ABDOMEN AND PELVIS WITHOUT  CONTRAST TECHNIQUE: Multidetector CT imaging of the chest, abdomen and pelvis was performed following the standard protocol without IV contrast. RADIATION DOSE REDUCTION: This exam was performed according to the departmental dose-optimization program which includes automated exposure control, adjustment of the mA and/or kV according to patient size and/or use of iterative reconstruction technique. COMPARISON:  11/16/2013 FINDINGS: CT CHEST FINDINGS Cardiovascular: Mild cardiomegaly. Small pericardial effusion. Diffuse coronary artery and aortic atherosclerosis. No aneurysm. Mediastinum/Nodes: No mediastinal, hilar, or axillary adenopathy. Trachea and esophagus are unremarkable. Thyroid unremarkable. Lungs/Pleura: Bibasilar atelectasis. No effusions or pneumothorax. No confluent airspace opacities. Calcified granuloma posteriorly in the left upper lobe. Musculoskeletal: Chest wall soft tissues are unremarkable. No acute bony abnormality. CT ABDOMEN PELVIS FINDINGS Hepatobiliary: No focal hepatic abnormality. Gallbladder unremarkable. Pancreas: No focal abnormality or ductal dilatation. Spleen: No focal abnormality.  Normal size. Adrenals/Urinary Tract: Prior left nephrectomy. Adrenal glands unremarkable. No right renal stone, hydronephrosis or mass. Bladder wall is thickened and irregular with diverticula, likely related to chronic bladder outlet obstruction. Stomach/Bowel: Diffuse colonic diverticulosis, most pronounced in the left colon. No diverticulitis. Duodenal diverticula noted in the descending portion of the duodenum. Stomach unremarkable. No bowel obstruction. Vascular/Lymphatic: Aortic atherosclerosis. No evidence of aneurysm or adenopathy. Reproductive: Prostate enlargement Other: No free fluid or free air. Musculoskeletal: No acute bony abnormality. IMPRESSION: Cardiomegaly, coronary artery disease.  Aortic atherosclerosis. Bibasilar atelectasis. Colonic diverticulosis.  No active diverticulitis. Bladder  wall thickening and trabeculation with several diverticula compatible with chronic bladder outlet obstruction. Prostate enlargement. No acute findings. Electronically Signed   By: Charlett Nose M.D.   On: 03/17/2023 00:44   CT Head Wo Contrast  Result Date: 03/17/2023 CLINICAL DATA:  Generalized weakness, altered mental status EXAM: CT HEAD WITHOUT CONTRAST TECHNIQUE: Contiguous axial images were obtained from the base of the skull through the vertex without intravenous contrast. RADIATION DOSE REDUCTION: This exam was performed according to the departmental dose-optimization program which includes automated exposure control, adjustment of the mA and/or kV according to patient size and/or use of iterative reconstruction technique. COMPARISON:  CT head 07/28/2010 CT abdomen pelvis 11/16/2013 FINDINGS: CT HEAD FINDINGS Brain: Progressive parenchymal volume loss is commensurate with the patient's age. No acute intracranial hemorrhage or infarct. No abnormal mass effect or midline shift. No abnormal intra  or extra-axial mass lesion or fluid collection. Ventricular size is prominent, but commensurate with the degree of parenchymal atrophy. Remote infarct within the right cerebellar hemisphere noted, new since prior examination. Vascular: No asymmetric hyperdense vasculature at the skull base. Skull: Normal. Negative for fracture or focal lesion. Sinuses/Orbits: No acute finding. Other: Mastoid air cells and middle ear cavities are are clear. IMPRESSION: 1. No acute intracranial hemorrhage or infarct. 2. Progressive parenchymal volume loss, commensurate with the patient's age. 3. Remote right cerebellar hemisphere infarct, new since prior examination. Electronically Signed   By: Helyn Numbers M.D.   On: 03/17/2023 00:18     The results of significant diagnostics from this hospitalization (including imaging, microbiology, ancillary and laboratory) are listed below for reference.     Microbiology: No results found  for this or any previous visit (from the past 240 hour(s)).   Labs: BNP (last 3 results) Recent Labs    03/22/23 0516  BNP 143.7*   Basic Metabolic Panel: Recent Labs  Lab 03/21/23 0906 03/22/23 0516 03/23/23 0508 03/24/23 0516 03/26/23 0505  NA 130* 130* 130* 130* 129*  K 4.0 3.7 3.5 3.9 4.0  CL 106 101 96* 96* 99  CO2 17* 19* 24 27 22   GLUCOSE 91 102* 117* 122* 108*  BUN 16 16 18 18 20   CREATININE 1.10 1.22 1.20 1.16 1.19  CALCIUM 8.2* 8.4* 8.9 8.9 8.7*   Liver Function Tests: Recent Labs  Lab 03/21/23 0906 03/22/23 0516 03/23/23 0508 03/24/23 0516 03/26/23 0505  AST 22 25 31  33 26  ALT 19 22 24 27 24   ALKPHOS 56 59 63 74 61  BILITOT 0.6 1.1 0.9 0.7 0.6  PROT 5.0* 5.4* 5.8* 5.6* 5.1*  ALBUMIN 2.4* 2.9* 3.0* 2.9* 2.7*   No results for input(s): "LIPASE", "AMYLASE" in the last 168 hours. No results for input(s): "AMMONIA" in the last 168 hours. CBC: Recent Labs  Lab 03/22/23 0516 03/23/23 0508 03/24/23 0516 03/26/23 0505 03/27/23 0854  WBC 5.6 5.5 5.3 5.6 6.1  HGB 12.2* 13.2 13.3 12.6* 12.4*  HCT 35.8* 37.2* 38.6* 36.6* 36.6*  MCV 94.0 91.6 92.1 93.4 93.4  PLT 188 233 244 232 237   Cardiac Enzymes: No results for input(s): "CKTOTAL", "CKMB", "CKMBINDEX", "TROPONINI" in the last 168 hours. BNP: Invalid input(s): "POCBNP" CBG: No results for input(s): "GLUCAP" in the last 168 hours. D-Dimer No results for input(s): "DDIMER" in the last 72 hours. Hgb A1c No results for input(s): "HGBA1C" in the last 72 hours. Lipid Profile No results for input(s): "CHOL", "HDL", "LDLCALC", "TRIG", "CHOLHDL", "LDLDIRECT" in the last 72 hours. Thyroid function studies No results for input(s): "TSH", "T4TOTAL", "T3FREE", "THYROIDAB" in the last 72 hours.  Invalid input(s): "FREET3" Anemia work up No results for input(s): "VITAMINB12", "FOLATE", "FERRITIN", "TIBC", "IRON", "RETICCTPCT" in the last 72 hours. Urinalysis    Component Value Date/Time   COLORURINE  YELLOW 03/17/2023 0211   APPEARANCEUR CLEAR 03/17/2023 0211   LABSPEC 1.017 03/17/2023 0211   PHURINE 6.0 03/17/2023 0211   GLUCOSEU NEGATIVE 03/17/2023 0211   HGBUR NEGATIVE 03/17/2023 0211   HGBUR small 03/16/2010 0855   BILIRUBINUR NEGATIVE 03/17/2023 0211   BILIRUBINUR neg 07/18/2016 1107   KETONESUR NEGATIVE 03/17/2023 0211   PROTEINUR NEGATIVE 03/17/2023 0211   UROBILINOGEN 0.2 07/18/2016 1107   UROBILINOGEN 0.2 07/28/2010 2150   NITRITE NEGATIVE 03/17/2023 0211   LEUKOCYTESUR NEGATIVE 03/17/2023 0211   Sepsis Labs Recent Labs  Lab 03/23/23 0508 03/24/23 0516 03/26/23 0505 03/27/23 0854  WBC  5.5 5.3 5.6 6.1   Microbiology No results found for this or any previous visit (from the past 240 hour(s)).   Time coordinating discharge:  I have spent 35 minutes face to face with the patient and on the ward discussing the patients care, assessment, plan and disposition with other care givers. >50% of the time was devoted counseling the patient about the risks and benefits of treatment/Discharge disposition and coordinating care.   SIGNED:   Dimple Nanas, MD  [facility] 03/27/2023, 1:45 PM   If 7PM-7AM, please contact night-coverage

## 2023-03-27 NOTE — TOC Progression Note (Addendum)
Transition of Care [facility]) - Progression Note    Patient Details  Name: Justin Gallegos MRN: [id number] Date of Birth: 1928/11/16  [facility]) CM/SW Contact  , Olegario Messier, RN Phone Number: 03/27/2023, 9:33 AM  Clinical Narrative:  Per HTA rep Tammy under review-await Berkley Harvey for [place], & PTAR.  -11:49a received call from HTA rep Junious Dresser requesting PT to see again concerns if patient has had a set back. Team notified. -12:24p-left vm w/HTA rep Junious Dresser to review mobility specialist note for review on auth.Await outcome.    Expected Discharge Plan: Skilled Nursing Facility Barriers to Discharge: Insurance Authorization  Expected Discharge Plan and Services   Discharge Planning Services: CM Consult   Living arrangements for the past 2 months: Single Family Home                                       Social Determinants of Health (SDOH) Interventions SDOH Screenings   Food Insecurity: No Food Insecurity (03/17/2023)  Housing: Low Risk  (03/17/2023)  Transportation Needs: No Transportation Needs (03/17/2023)  Utilities: Not At Risk (03/17/2023)  Alcohol Screen: Low Risk  (12/31/2022)  Depression (PHQ2-9): Low Risk  (12/31/2022)  Financial Resource Strain: Low Risk  (12/31/2022)  Physical Activity: Inactive (12/31/2022)  Social Connections: Moderately Integrated (12/31/2022)  Stress: No Stress Concern Present (12/31/2022)  Tobacco Use: Low Risk  (03/17/2023)    Readmission Risk Interventions     No data to display

## 2023-03-27 NOTE — Progress Notes (Signed)
Mobility Specialist - Progress Note   03/27/23 1155  Mobility  Activity Transferred from bed to chair  Level of Assistance Maximum assist, patient does 25-49%  Assistive Device Front wheel walker  Distance Ambulated (ft) 2 ft  Range of Motion/Exercises Active Assistive  Activity Response Tolerated fair  $Mobility charge 1 Mobility   Pt was found in bed and agreeable to transfer to recliner chair. Pt was max-A for bed mobility and able to follow cues when directed. Grows fatigued quickly and after 27ft stated feeling tired and wanting to sit down. Was left on recliner chair with all needs met. Chair alarm on.  Billey Chang Mobility Specialist

## 2023-03-27 NOTE — Progress Notes (Signed)
ANTICOAGULATION CONSULT NOTE  Pharmacy Consult for warfarin Indication: atrial fibrillation  Allergies  Allergen Reactions   Penicillins     Unable to breathe    Amoxicillin-Pot Clavulanate     REACTION: throat swelling    Patient Measurements: Height: 6' (182.9 cm) Weight: 89.2 kg (196 lb 10.4 oz) IBW/kg (Calculated) : 77.6   Vital Signs: Temp: 97.9 F (36.6 C) (08/15 0603) Temp Source: Oral (08/15 0603) BP: 164/98 (08/15 0603) Pulse Rate: 94 (08/15 0603)  Labs: Recent Labs    03/25/23 0517 03/26/23 0505 03/27/23 0854  HGB  --  12.6* 12.4*  HCT  --  36.6* 36.6*  PLT  --  232 237  LABPROT 24.5*  --  24.7*  INR 2.2*  --  2.2*  CREATININE  --  1.19  --     Estimated Creatinine Clearance: 43.5 mL/min (by C-G formula based on SCr of 1.19 mg/dL).   Medications: Warfarin  -Home dose: Warfarin 2.5 mg daily except 3.75 mg Sun/Thur -Last dose PTA: 8/4 -INR goal per [facility] clinic note: 2.5-3. INR 2.9 on 6/19 visit  Assessment: Pt is a 89 yoM presenting on 8/4 with weakness and confusion. PMH significant for atrial fibrillation (on warfarin PTA), TIA. Pharmacy consulted to dose warfarin.  INR on admission was supratherapeutic. Warfarin dose held 8/5. Noted pt dehydrated on admission which could have contributed.   Today, 03/27/23  INR = 2.2 therapeutic, slightly below outpatient goal of 2.5-3 CBC has been stable. Diet: Regular. 95% meal intake charted 8/14 No major DDI  Goal of Therapy:  INR 2-3 (targeting 2.5 -3 outpatient) Monitor platelets by anticoagulation protocol: Yes   Plan:  Continue home dose: 2.5 mg PO daily, except 3.75 mg on Sundays and Thursdays.  INR and CBC on Tues, Thurs, Sat  Shelda Altes, PharmD 03/27/23 11:29 AM

## 2023-03-27 NOTE — Plan of Care (Signed)
  Problem: Clinical Measurements: Goal: Respiratory complications will improve Outcome: Progressing Goal: Cardiovascular complication will be avoided Outcome: Progressing   Problem: Nutrition: Goal: Adequate nutrition will be maintained Outcome: Progressing   Problem: Pain Managment: Goal: General experience of comfort will improve Outcome: Progressing   Problem: Safety: Goal: Ability to remain free from injury will improve Outcome: Progressing   

## 2023-03-27 NOTE — TOC Transition Note (Addendum)
Transition of Care [facility]) - CM/SW Discharge Note   Patient Details  Name: Justin Gallegos MRN: [id number] Date of Birth: 1929/06/30  Transition of Care [facility]) CM/SW Contact:  Lanier Clam, RN Phone Number: 03/27/2023, 1:57 PM   Clinical Narrative: Iline Oven from [employer] auth# [id number] for 7 days-rep Christy aware & accepted for [facility]. PTAR ONGE#[id number]. [employer] rep Junious Dresser recc spouse discuss d/c plans [facility].DNR. Going to rm#[phone],report tel#[phone] if no response then call [phone] ask for  [address] Nurse to be paged.PTAR called. No further CM needs. -2:11p-Called [name](spouse), & Stephanie(dtr) informed of auth for [facility], PTAR for p/u. Voiced understanding. No further CM needs.    Final next level of care: Skilled Nursing Facility Barriers to Discharge: No Barriers Identified   Patient Goals and CMS Choice CMS Medicare.gov Compare Post Acute Care list provided to:: Patient Represenative (must comment) ([name](spouse)) Choice offered to / list presented to : Spouse  Discharge Placement PASRR number recieved: 03/24/23 PASRR number recieved: 03/24/23            Patient chooses bed at: Other - please specify in the comment section below: ([facility]) Patient to be transferred to facility by: PTAR Name of family member notified: Chidi Chann) Patient and family notified of of transfer: 03/27/23  Discharge Plan and Services Additional resources added to the After Visit Summary for     Discharge Planning Services: CM Consult                                 Social Determinants of Health (SDOH) Interventions SDOH Screenings   Food Insecurity: No Food Insecurity (03/17/2023)  Housing: Low Risk  (03/17/2023)  Transportation Needs: No Transportation Needs (03/17/2023)  Utilities: Not At Risk (03/17/2023)  Alcohol Screen: Low Risk  (12/31/2022)  Depression (PHQ2-9): Low Risk  (12/31/2022)  Financial Resource Strain: Low Risk  (12/31/2022)  Physical Activity:  Inactive (12/31/2022)  Social Connections: Moderately Integrated (12/31/2022)  Stress: No Stress Concern Present (12/31/2022)  Tobacco Use: Low Risk  (03/17/2023)     Readmission Risk Interventions     No data to display

## 2023-03-27 NOTE — Progress Notes (Signed)
This nurse agree with previous assessment. Patient was discharged with foley catheter. Son was informed of transfer.

## 2023-03-27 NOTE — Progress Notes (Signed)
PROGRESS NOTE    Justin Gallegos  LKG:[id number] DOB: [birth year] DOA: 03/16/2023 PCP: Nelwyn Salisbury, MD    Brief Narrative:  89 year old with history of diastolic CHF EF 60%, HTN, HLD, permanent A-fib on Coumadin, TIA, chronic low back pain, hemorrhoids admitted to [facility] long ED on 8/5 due to progressive weakness and confusion.  Also had a brief episode of atrial fibrillation with RVR.  Upon admission CT of the head was negative, CT abdomen pelvis did not show any acute pathology besides chronic bladder outlet obstruction.  He was noted to have multiple electrolyte abnormality including hyponatremia, hypochloremia, hypocalcemia and hypomagnesemia.  Suspected secondary to dehydration which improved with IV fluids.  PT OT recommended SNF, palliative care service to continue to follow at SNF as well.       Assessment & Plan:  Principal Problem:   Hyponatremia Active Problems:   Hypomagnesemia   Acute metabolic encephalopathy   BPH (benign prostatic hyperplasia)   Mild bibasilar atelectasis   Chronic diastolic CHF (congestive heart failure) (HCC)   Atrial fibrillation, chronic (HCC)   Metabolic acidosis   Hyperlipidemia   Essential hypertension   Atherosclerosis   Malnutrition of moderate degree     Hyponatremia/hypocalcemia/hypomagnesemia Hypochloremia This is secondary to dehydration from Lasix use and poor oral intake.  Appears to have improved with repletion.  Currently sodium level remains stable around 129    Acute metabolic encephalopathy with underlying dementia: Improving Secondary to poor oral intake.  CT of the head is negative, CT abdomen pelvis did not show any acute pathology.  Mentation is slowly improving.  Seen by palliative care services, patient is now DNR with recommendations for palliative care service to continue to follow-up outpatient.   Chronic bladder outlet obstruction BPH CT Abdomen/pelvis with bladder wall thickening, trabeculation with severe  diverticula compatible with chronic bladder outlet obstruction and prostate enlargement.  No previous history of BPH in the past.  Admitting physician discussed with urology, Dr. Lafonda Mosses; recommended Foley catheter placement and initiation of Flomax.  Will require outpatient follow-up   Chronic/permanent atrial fibrillation on anticoagulation Essential hypertension Acutely decompensated Chronic diastolic congestive heart failure TTE 04/10/2020 with LVEF 55 to 60%, moderate LVH, no LV regional wall motion abnormalities, IVC normal in size, no aortic stenosis.  Currently on Toprol-XL, continue Coumadin with goal INR 2.0-3.0.   Hyperlipidemia Aortic atherosclerosis Continue statin and fenofibrate   CKD stage IIIb Creatinine around baseline now of 1.1   Weakness/debility/deconditioning/gait disturbance: Generalized weakness prior to arrival.  Resides at home prior to hospitalization.  Now PT/OT is recommending SNF therefore arrangements be made by TOC.     DVT prophylaxis: Coumadin Code Status: DNR  Family Communication:   Status is: Inpatient Remains inpatient appropriate because: Medically stable, awaiting SNF placement               Diet Orders (From admission, onward)     Start     Ordered   03/24/23 1302  Diet regular Room service appropriate? Yes; Fluid consistency: Thin  Diet effective now       Question Answer Comment  Room service appropriate? Yes   Fluid consistency: Thin      03/24/23 1302            Subjective:  Doing okay, no new complaints.  Examination:  General exam: Appears calm and comfortable  Respiratory system: Clear to auscultation. Respiratory effort normal. Cardiovascular system: S1 & S2 heard, RRR. No JVD, murmurs, rubs, gallops or clicks. No pedal edema.  Gastrointestinal system: Abdomen is nondistended, soft and nontender. No organomegaly or masses felt. Normal bowel sounds heard. Central nervous system: Alert and oriented. No focal  neurological deficits. Extremities: Symmetric 4 x 5 power. Skin: No rashes, lesions or ulcers Psychiatry: Poor judgment and insight  Objective: Vitals:   03/26/23 1243 03/26/23 1934 03/27/23 0603 03/27/23 0610  BP: 122/67 125/78 (!) 164/98   Pulse: 91 88 94   Resp:  17 18   Temp: 97.6 F (36.4 C) 97.7 F (36.5 C) 97.9 F (36.6 C)   TempSrc: Oral Oral Oral   SpO2: 98% 99% 99%   Weight:    89.2 kg  Height:        Intake/Output Summary (Last 24 hours) at 03/27/2023 1013 Last data filed at 03/27/2023 1610 Gross per 24 hour  Intake 480 ml  Output 1300 ml  Net -820 ml   Filed Weights   03/25/23 0500 03/26/23 0500 03/27/23 0610  Weight: 88.7 kg 88.4 kg 89.2 kg    Scheduled Meds:  atorvastatin  40 mg Oral Daily   carbamide peroxide  5 drop Both EARS BID   Chlorhexidine Gluconate Cloth  6 each Topical Daily   ezetimibe  10 mg Oral Daily   feeding supplement  237 mL Oral TID BM   fenofibrate  54 mg Oral Daily   metoprolol succinate  50 mg Oral Daily   multivitamin with minerals  1 tablet Oral Daily   mouth rinse  15 mL Mouth Rinse 4 times per day   sodium chloride flush  3 mL Intravenous Q12H   tamsulosin  0.4 mg Oral Daily   warfarin  3.75 mg Oral Once per day on Sunday Thursday   And   warfarin  2.5 mg Oral Once per day on Monday Tuesday Wednesday Friday Saturday   Warfarin - Pharmacist Dosing Inpatient   Does not apply q1600   Continuous Infusions:  sodium chloride      Nutritional status Signs/Symptoms: mild fat depletion, mild muscle depletion, energy intake < 75% for > 7 days Interventions: Ensure Enlive (each supplement provides 350kcal and 20 grams of protein), MVI Body mass index is 26.67 kg/m.  Data Reviewed:   CBC: Recent Labs  Lab 03/22/23 0516 03/23/23 0508 03/24/23 0516 03/26/23 0505 03/27/23 0854  WBC 5.6 5.5 5.3 5.6 6.1  HGB 12.2* 13.2 13.3 12.6* 12.4*  HCT 35.8* 37.2* 38.6* 36.6* 36.6*  MCV 94.0 91.6 92.1 93.4 93.4  PLT 188 233 244 232  237   Basic Metabolic Panel: Recent Labs  Lab 03/21/23 0906 03/22/23 0516 03/23/23 0508 03/24/23 0516 03/26/23 0505  NA 130* 130* 130* 130* 129*  K 4.0 3.7 3.5 3.9 4.0  CL 106 101 96* 96* 99  CO2 17* 19* 24 27 22   GLUCOSE 91 102* 117* 122* 108*  BUN 16 16 18 18 20   CREATININE 1.10 1.22 1.20 1.16 1.19  CALCIUM 8.2* 8.4* 8.9 8.9 8.7*   GFR: Estimated Creatinine Clearance: 43.5 mL/min (by C-G formula based on SCr of 1.19 mg/dL). Liver Function Tests: Recent Labs  Lab 03/21/23 0906 03/22/23 0516 03/23/23 0508 03/24/23 0516 03/26/23 0505  AST 22 25 31  33 26  ALT 19 22 24 27 24   ALKPHOS 56 59 63 74 61  BILITOT 0.6 1.1 0.9 0.7 0.6  PROT 5.0* 5.4* 5.8* 5.6* 5.1*  ALBUMIN 2.4* 2.9* 3.0* 2.9* 2.7*   No results for input(s): "LIPASE", "AMYLASE" in the last 168 hours. No results for input(s): "AMMONIA" in the last 168  hours. Coagulation Profile: Recent Labs  Lab 03/22/23 0516 03/23/23 0508 03/24/23 0516 03/25/23 0517 03/27/23 0854  INR 2.4* 2.0* 2.2* 2.2* 2.2*   Cardiac Enzymes: No results for input(s): "CKTOTAL", "CKMB", "CKMBINDEX", "TROPONINI" in the last 168 hours. BNP (last 3 results) No results for input(s): "PROBNP" in the last 8760 hours. HbA1C: No results for input(s): "HGBA1C" in the last 72 hours. CBG: No results for input(s): "GLUCAP" in the last 168 hours. Lipid Profile: No results for input(s): "CHOL", "HDL", "LDLCALC", "TRIG", "CHOLHDL", "LDLDIRECT" in the last 72 hours. Thyroid Function Tests: No results for input(s): "TSH", "T4TOTAL", "FREET4", "T3FREE", "THYROIDAB" in the last 72 hours. Anemia Panel: No results for input(s): "VITAMINB12", "FOLATE", "FERRITIN", "TIBC", "IRON", "RETICCTPCT" in the last 72 hours. Sepsis Labs: No results for input(s): "PROCALCITON", "LATICACIDVEN" in the last 168 hours.  No results found for this or any previous visit (from the past 240 hour(s)).       Radiology Studies: No results found.         LOS:  10 days   Time spent= 35 mins     Joline Maxcy, MD [facility]  If 7PM-7AM, please contact night-coverage  03/27/2023, 10:13 AM

## 2023-03-28 DIAGNOSIS — E559 Vitamin D deficiency, unspecified: Secondary | ICD-10-CM | POA: Diagnosis not present

## 2023-03-28 DIAGNOSIS — E781 Pure hyperglyceridemia: Secondary | ICD-10-CM | POA: Diagnosis not present

## 2023-03-28 DIAGNOSIS — G9341 Metabolic encephalopathy: Secondary | ICD-10-CM | POA: Diagnosis not present

## 2023-03-28 DIAGNOSIS — E785 Hyperlipidemia, unspecified: Secondary | ICD-10-CM | POA: Diagnosis not present

## 2023-03-28 DIAGNOSIS — R5381 Other malaise: Secondary | ICD-10-CM | POA: Diagnosis not present

## 2023-03-28 DIAGNOSIS — I10 Essential (primary) hypertension: Secondary | ICD-10-CM | POA: Diagnosis not present

## 2023-03-28 DIAGNOSIS — I4891 Unspecified atrial fibrillation: Secondary | ICD-10-CM | POA: Diagnosis not present

## 2023-03-31 DIAGNOSIS — E785 Hyperlipidemia, unspecified: Secondary | ICD-10-CM | POA: Diagnosis not present

## 2023-03-31 DIAGNOSIS — I4891 Unspecified atrial fibrillation: Secondary | ICD-10-CM | POA: Diagnosis not present

## 2023-03-31 DIAGNOSIS — R5381 Other malaise: Secondary | ICD-10-CM | POA: Diagnosis not present

## 2023-03-31 DIAGNOSIS — I10 Essential (primary) hypertension: Secondary | ICD-10-CM | POA: Diagnosis not present

## 2023-03-31 DIAGNOSIS — E559 Vitamin D deficiency, unspecified: Secondary | ICD-10-CM | POA: Diagnosis not present

## 2023-03-31 DIAGNOSIS — G9341 Metabolic encephalopathy: Secondary | ICD-10-CM | POA: Diagnosis not present

## 2023-03-31 DIAGNOSIS — E781 Pure hyperglyceridemia: Secondary | ICD-10-CM | POA: Diagnosis not present

## 2023-04-02 DIAGNOSIS — I4891 Unspecified atrial fibrillation: Secondary | ICD-10-CM | POA: Diagnosis not present

## 2023-04-02 DIAGNOSIS — E785 Hyperlipidemia, unspecified: Secondary | ICD-10-CM | POA: Diagnosis not present

## 2023-04-02 DIAGNOSIS — R5381 Other malaise: Secondary | ICD-10-CM | POA: Diagnosis not present

## 2023-04-02 DIAGNOSIS — G9341 Metabolic encephalopathy: Secondary | ICD-10-CM | POA: Diagnosis not present

## 2023-04-02 DIAGNOSIS — E559 Vitamin D deficiency, unspecified: Secondary | ICD-10-CM | POA: Diagnosis not present

## 2023-04-02 DIAGNOSIS — I10 Essential (primary) hypertension: Secondary | ICD-10-CM | POA: Diagnosis not present

## 2023-04-02 DIAGNOSIS — E781 Pure hyperglyceridemia: Secondary | ICD-10-CM | POA: Diagnosis not present

## 2023-04-03 DIAGNOSIS — I952 Hypotension due to drugs: Secondary | ICD-10-CM | POA: Diagnosis not present

## 2023-04-03 DIAGNOSIS — G309 Alzheimer's disease, unspecified: Secondary | ICD-10-CM | POA: Diagnosis not present

## 2023-04-03 DIAGNOSIS — I509 Heart failure, unspecified: Secondary | ICD-10-CM | POA: Diagnosis not present

## 2023-04-03 DIAGNOSIS — I4891 Unspecified atrial fibrillation: Secondary | ICD-10-CM | POA: Diagnosis not present

## 2023-04-07 DIAGNOSIS — E781 Pure hyperglyceridemia: Secondary | ICD-10-CM | POA: Diagnosis not present

## 2023-04-07 DIAGNOSIS — I4891 Unspecified atrial fibrillation: Secondary | ICD-10-CM | POA: Diagnosis not present

## 2023-04-07 DIAGNOSIS — R5381 Other malaise: Secondary | ICD-10-CM | POA: Diagnosis not present

## 2023-04-07 DIAGNOSIS — I10 Essential (primary) hypertension: Secondary | ICD-10-CM | POA: Diagnosis not present

## 2023-04-07 DIAGNOSIS — E785 Hyperlipidemia, unspecified: Secondary | ICD-10-CM | POA: Diagnosis not present

## 2023-04-07 DIAGNOSIS — E559 Vitamin D deficiency, unspecified: Secondary | ICD-10-CM | POA: Diagnosis not present

## 2023-04-07 DIAGNOSIS — G9341 Metabolic encephalopathy: Secondary | ICD-10-CM | POA: Diagnosis not present

## 2023-04-11 DIAGNOSIS — I4891 Unspecified atrial fibrillation: Secondary | ICD-10-CM | POA: Diagnosis not present

## 2023-04-11 DIAGNOSIS — G9341 Metabolic encephalopathy: Secondary | ICD-10-CM | POA: Diagnosis not present

## 2023-04-11 DIAGNOSIS — E781 Pure hyperglyceridemia: Secondary | ICD-10-CM | POA: Diagnosis not present

## 2023-04-11 DIAGNOSIS — I10 Essential (primary) hypertension: Secondary | ICD-10-CM | POA: Diagnosis not present

## 2023-04-11 DIAGNOSIS — R5381 Other malaise: Secondary | ICD-10-CM | POA: Diagnosis not present

## 2023-04-11 DIAGNOSIS — E785 Hyperlipidemia, unspecified: Secondary | ICD-10-CM | POA: Diagnosis not present

## 2023-04-11 DIAGNOSIS — E559 Vitamin D deficiency, unspecified: Secondary | ICD-10-CM | POA: Diagnosis not present

## 2023-04-14 DIAGNOSIS — I10 Essential (primary) hypertension: Secondary | ICD-10-CM | POA: Diagnosis not present

## 2023-04-14 DIAGNOSIS — E785 Hyperlipidemia, unspecified: Secondary | ICD-10-CM | POA: Diagnosis not present

## 2023-04-14 DIAGNOSIS — I4891 Unspecified atrial fibrillation: Secondary | ICD-10-CM | POA: Diagnosis not present

## 2023-04-14 DIAGNOSIS — E781 Pure hyperglyceridemia: Secondary | ICD-10-CM | POA: Diagnosis not present

## 2023-04-14 DIAGNOSIS — G9341 Metabolic encephalopathy: Secondary | ICD-10-CM | POA: Diagnosis not present

## 2023-04-14 DIAGNOSIS — E559 Vitamin D deficiency, unspecified: Secondary | ICD-10-CM | POA: Diagnosis not present

## 2023-04-14 DIAGNOSIS — R5381 Other malaise: Secondary | ICD-10-CM | POA: Diagnosis not present

## 2023-04-16 DIAGNOSIS — E781 Pure hyperglyceridemia: Secondary | ICD-10-CM | POA: Diagnosis not present

## 2023-04-16 DIAGNOSIS — R5381 Other malaise: Secondary | ICD-10-CM | POA: Diagnosis not present

## 2023-04-16 DIAGNOSIS — I10 Essential (primary) hypertension: Secondary | ICD-10-CM | POA: Diagnosis not present

## 2023-04-16 DIAGNOSIS — G9341 Metabolic encephalopathy: Secondary | ICD-10-CM | POA: Diagnosis not present

## 2023-04-16 DIAGNOSIS — E785 Hyperlipidemia, unspecified: Secondary | ICD-10-CM | POA: Diagnosis not present

## 2023-04-16 DIAGNOSIS — I4891 Unspecified atrial fibrillation: Secondary | ICD-10-CM | POA: Diagnosis not present

## 2023-04-16 DIAGNOSIS — E559 Vitamin D deficiency, unspecified: Secondary | ICD-10-CM | POA: Diagnosis not present

## 2023-04-18 DIAGNOSIS — E785 Hyperlipidemia, unspecified: Secondary | ICD-10-CM | POA: Diagnosis not present

## 2023-04-18 DIAGNOSIS — I4891 Unspecified atrial fibrillation: Secondary | ICD-10-CM | POA: Diagnosis not present

## 2023-04-18 DIAGNOSIS — G9341 Metabolic encephalopathy: Secondary | ICD-10-CM | POA: Diagnosis not present

## 2023-04-18 DIAGNOSIS — E559 Vitamin D deficiency, unspecified: Secondary | ICD-10-CM | POA: Diagnosis not present

## 2023-04-18 DIAGNOSIS — I10 Essential (primary) hypertension: Secondary | ICD-10-CM | POA: Diagnosis not present

## 2023-04-18 DIAGNOSIS — E781 Pure hyperglyceridemia: Secondary | ICD-10-CM | POA: Diagnosis not present

## 2023-04-18 DIAGNOSIS — R5381 Other malaise: Secondary | ICD-10-CM | POA: Diagnosis not present

## 2023-04-21 ENCOUNTER — Telehealth: Payer: Self-pay

## 2023-04-21 DIAGNOSIS — R5381 Other malaise: Secondary | ICD-10-CM | POA: Diagnosis not present

## 2023-04-21 DIAGNOSIS — E785 Hyperlipidemia, unspecified: Secondary | ICD-10-CM | POA: Diagnosis not present

## 2023-04-21 DIAGNOSIS — I10 Essential (primary) hypertension: Secondary | ICD-10-CM | POA: Diagnosis not present

## 2023-04-21 DIAGNOSIS — I4891 Unspecified atrial fibrillation: Secondary | ICD-10-CM | POA: Diagnosis not present

## 2023-04-21 DIAGNOSIS — E781 Pure hyperglyceridemia: Secondary | ICD-10-CM | POA: Diagnosis not present

## 2023-04-21 DIAGNOSIS — E559 Vitamin D deficiency, unspecified: Secondary | ICD-10-CM | POA: Diagnosis not present

## 2023-04-21 DIAGNOSIS — G9341 Metabolic encephalopathy: Secondary | ICD-10-CM | POA: Diagnosis not present

## 2023-04-21 NOTE — Telephone Encounter (Signed)
Pt was in hospital for approximately 2 weeks and d/c to SNF. Contacted pt' wife, Dorene Grebe, to inquire how she and the pt are doing. She reported this was not a good time to talk and will return call at a better time for her.

## 2023-04-25 ENCOUNTER — Inpatient Hospital Stay: Payer: PPO | Admitting: Family Medicine

## 2023-05-08 ENCOUNTER — Other Ambulatory Visit: Payer: Self-pay | Admitting: Family Medicine

## 2023-05-08 DIAGNOSIS — I10 Essential (primary) hypertension: Secondary | ICD-10-CM

## 2023-06-13 DEATH — deceased

## 2023-07-16 ENCOUNTER — Telehealth: Payer: Self-pay

## 2023-07-16 NOTE — Telephone Encounter (Signed)
Reviewing pt chart. Search online revealed pt passed away on June 21, 2023. Forwarding msg to front office for assistance with recording pt's chart as deceased.

## 2023-07-17 NOTE — Telephone Encounter (Signed)
Marked status as deceased.
